# Patient Record
Sex: Female | Born: 1955 | Race: White | Hispanic: No | Smoking: Never smoker
Health system: Southern US, Community
[De-identification: ages and names within clinical notes are randomized; demographics above are authoritative.]

## PROBLEM LIST (undated history)

## (undated) DIAGNOSIS — I509 Heart failure, unspecified: Secondary | ICD-10-CM

## (undated) DIAGNOSIS — I10 Essential (primary) hypertension: Secondary | ICD-10-CM

## (undated) DIAGNOSIS — I639 Cerebral infarction, unspecified: Secondary | ICD-10-CM

## (undated) DIAGNOSIS — E119 Type 2 diabetes mellitus without complications: Secondary | ICD-10-CM

## (undated) DIAGNOSIS — R161 Splenomegaly, not elsewhere classified: Secondary | ICD-10-CM

## (undated) HISTORY — PX: APPENDECTOMY: SHX54

## (undated) HISTORY — PX: CHOLECYSTECTOMY: SHX55

## (undated) HISTORY — DX: Splenomegaly, not elsewhere classified: R16.1

## (undated) HISTORY — PX: OTHER SURGICAL HISTORY: SHX169

---

## 1998-03-20 ENCOUNTER — Other Ambulatory Visit: Admission: RE | Admit: 1998-03-20 | Discharge: 1998-03-20 | Payer: Self-pay | Admitting: *Deleted

## 1998-11-15 ENCOUNTER — Ambulatory Visit (HOSPITAL_BASED_OUTPATIENT_CLINIC_OR_DEPARTMENT_OTHER): Admission: RE | Admit: 1998-11-15 | Discharge: 1998-11-15 | Payer: Self-pay | Admitting: Plastic Surgery

## 1998-12-28 ENCOUNTER — Encounter: Payer: Self-pay | Admitting: Family Medicine

## 1998-12-28 ENCOUNTER — Ambulatory Visit (HOSPITAL_COMMUNITY): Admission: RE | Admit: 1998-12-28 | Discharge: 1998-12-28 | Payer: Self-pay | Admitting: Family Medicine

## 1999-04-18 ENCOUNTER — Other Ambulatory Visit: Admission: RE | Admit: 1999-04-18 | Discharge: 1999-04-18 | Payer: Self-pay | Admitting: *Deleted

## 2000-06-19 ENCOUNTER — Other Ambulatory Visit: Admission: RE | Admit: 2000-06-19 | Discharge: 2000-06-19 | Payer: Self-pay | Admitting: *Deleted

## 2001-02-02 ENCOUNTER — Encounter: Admission: RE | Admit: 2001-02-02 | Discharge: 2001-02-02 | Payer: Self-pay | Admitting: Orthopedic Surgery

## 2001-02-02 ENCOUNTER — Encounter: Payer: Self-pay | Admitting: Orthopedic Surgery

## 2002-04-12 ENCOUNTER — Encounter: Payer: Self-pay | Admitting: *Deleted

## 2002-04-12 ENCOUNTER — Encounter: Admission: RE | Admit: 2002-04-12 | Discharge: 2002-04-12 | Payer: Self-pay | Admitting: *Deleted

## 2004-12-06 ENCOUNTER — Other Ambulatory Visit: Admission: RE | Admit: 2004-12-06 | Discharge: 2004-12-06 | Payer: Self-pay | Admitting: Obstetrics and Gynecology

## 2005-02-14 ENCOUNTER — Encounter: Admission: RE | Admit: 2005-02-14 | Discharge: 2005-02-14 | Payer: Self-pay | Admitting: Family Medicine

## 2005-05-01 ENCOUNTER — Encounter: Admission: RE | Admit: 2005-05-01 | Discharge: 2005-05-01 | Payer: Self-pay | Admitting: Neurosurgery

## 2011-01-22 ENCOUNTER — Inpatient Hospital Stay: Payer: Self-pay | Admitting: Internal Medicine

## 2011-01-25 ENCOUNTER — Emergency Department: Payer: Self-pay | Admitting: Emergency Medicine

## 2011-10-16 ENCOUNTER — Inpatient Hospital Stay: Payer: Self-pay | Admitting: *Deleted

## 2012-04-28 ENCOUNTER — Emergency Department: Payer: Self-pay | Admitting: Emergency Medicine

## 2012-04-28 LAB — CBC
HCT: 34 % — ABNORMAL LOW (ref 35.0–47.0)
MCHC: 34.3 g/dL (ref 32.0–36.0)
MCV: 92 fL (ref 80–100)
Platelet: 158 10*3/uL (ref 150–440)
RDW: 13.6 % (ref 11.5–14.5)
WBC: 7.2 10*3/uL (ref 3.6–11.0)

## 2012-04-28 LAB — BASIC METABOLIC PANEL
Chloride: 104 mmol/L (ref 98–107)
Co2: 32 mmol/L (ref 21–32)
Creatinine: 1.1 mg/dL (ref 0.60–1.30)
EGFR (Non-African Amer.): 56 — ABNORMAL LOW
Potassium: 3.4 mmol/L — ABNORMAL LOW (ref 3.5–5.1)
Sodium: 141 mmol/L (ref 136–145)

## 2012-08-11 LAB — URINALYSIS, COMPLETE
Ketone: NEGATIVE
Ph: 6 (ref 4.5–8.0)
Protein: 100
RBC,UR: 6 /HPF (ref 0–5)
Squamous Epithelial: 17

## 2012-08-11 LAB — COMPREHENSIVE METABOLIC PANEL
Anion Gap: 8 (ref 7–16)
BUN: 17 mg/dL (ref 7–18)
Calcium, Total: 9 mg/dL (ref 8.5–10.1)
Chloride: 103 mmol/L (ref 98–107)
EGFR (African American): >60
EGFR (Non-African Amer.): >60
Osmolality: 285 (ref 275–301)
Potassium: 3.3 mmol/L — ABNORMAL LOW (ref 3.5–5.1)
SGOT(AST): 28 U/L (ref 15–37)
SGPT (ALT): 27 U/L (ref 12–78)
Total Protein: 7 g/dL (ref 6.4–8.2)

## 2012-08-11 LAB — TROPONIN I: Troponin-I: <0.02 ng/mL

## 2012-08-11 LAB — CBC
HCT: 35.2 % (ref 35.0–47.0)
HGB: 12.5 g/dL (ref 12.0–16.0)
MCH: 31.3 pg (ref 26.0–34.0)
MCHC: 35.5 g/dL (ref 32.0–36.0)
MCV: 88 fL (ref 80–100)
RBC: 3.99 10*6/uL (ref 3.80–5.20)

## 2012-08-11 LAB — DRUG SCREEN, URINE
Amphetamines, Ur Screen: NEGATIVE (ref ?–1000)
Barbiturates, Ur Screen: NEGATIVE (ref ?–200)
Benzodiazepine, Ur Scrn: NEGATIVE (ref ?–200)
Methadone, Ur Screen: NEGATIVE (ref ?–300)
Opiate, Ur Screen: NEGATIVE (ref ?–300)
Phencyclidine (PCP) Ur S: NEGATIVE (ref ?–25)

## 2012-08-12 ENCOUNTER — Inpatient Hospital Stay: Payer: Self-pay | Admitting: Internal Medicine

## 2012-08-12 LAB — TROPONIN I: Troponin-I: <0.02 ng/mL

## 2012-08-12 LAB — CK TOTAL AND CKMB (NOT AT ARMC)
CK, Total: 44 U/L (ref 21–215)
CK, Total: 39 U/L (ref 21–215)
CK-MB: 1.5 ng/mL (ref 0.5–3.6)
CK-MB: 1.1 ng/mL (ref 0.5–3.6)

## 2012-08-12 LAB — PRO B NATRIURETIC PEPTIDE: B-Type Natriuretic Peptide: 317 pg/mL — ABNORMAL HIGH (ref 0–125)

## 2012-08-12 LAB — SEDIMENTATION RATE: Erythrocyte Sed Rate: 29 mm/hr (ref 0–30)

## 2012-08-13 LAB — CBC WITH DIFFERENTIAL/PLATELET
Basophil #: 0 10*3/uL (ref 0.0–0.1)
Eosinophil %: 1.4 %
HGB: 11.3 g/dL — ABNORMAL LOW (ref 12.0–16.0)
Lymphocyte %: 24.3 %
MCH: 30.9 pg (ref 26.0–34.0)
Monocyte #: 0.6 x10 3/mm (ref 0.2–0.9)
Neutrophil %: 65.2 %
Platelet: 206 10*3/uL (ref 150–440)
RBC: 3.65 10*6/uL — ABNORMAL LOW (ref 3.80–5.20)

## 2012-08-13 LAB — BASIC METABOLIC PANEL
BUN: 22 mg/dL — ABNORMAL HIGH (ref 7–18)
Creatinine: 1.25 mg/dL (ref 0.60–1.30)
EGFR (Non-African Amer.): 48 — ABNORMAL LOW
Glucose: 336 mg/dL — ABNORMAL HIGH (ref 65–99)
Potassium: 3.4 mmol/L — ABNORMAL LOW (ref 3.5–5.1)
Sodium: 138 mmol/L (ref 136–145)

## 2012-08-13 LAB — PROTIME-INR: INR: 1

## 2012-08-13 LAB — LIPID PANEL
Ldl Cholesterol, Calc: 28 mg/dL (ref 0–100)
Triglycerides: 268 mg/dL — ABNORMAL HIGH (ref 0–200)
VLDL Cholesterol, Calc: 54 mg/dL — ABNORMAL HIGH (ref 5–40)

## 2012-08-13 LAB — HEMOGLOBIN A1C: Hemoglobin A1C: 9.4 % — ABNORMAL HIGH (ref 4.2–6.3)

## 2012-08-14 LAB — BASIC METABOLIC PANEL
Calcium, Total: 8.5 mg/dL (ref 8.5–10.1)
Co2: 26 mmol/L (ref 21–32)
Potassium: 3.9 mmol/L (ref 3.5–5.1)
Sodium: 141 mmol/L (ref 136–145)

## 2012-08-16 ENCOUNTER — Emergency Department: Payer: Self-pay | Admitting: Emergency Medicine

## 2012-08-16 LAB — COMPREHENSIVE METABOLIC PANEL
Albumin: 3.2 g/dL — ABNORMAL LOW (ref 3.4–5.0)
Anion Gap: 7 (ref 7–16)
Bilirubin,Total: 0.5 mg/dL (ref 0.2–1.0)
Chloride: 106 mmol/L (ref 98–107)
Glucose: 247 mg/dL — ABNORMAL HIGH (ref 65–99)
Potassium: 3.7 mmol/L (ref 3.5–5.1)
SGOT(AST): 25 U/L (ref 15–37)
SGPT (ALT): 31 U/L (ref 12–78)
Sodium: 139 mmol/L (ref 136–145)
Total Protein: 7.1 g/dL (ref 6.4–8.2)

## 2012-08-16 LAB — CBC
MCH: 32 pg (ref 26.0–34.0)
MCHC: 35.4 g/dL (ref 32.0–36.0)
MCV: 91 fL (ref 80–100)
Platelet: 187 10*3/uL (ref 150–440)
RBC: 3.73 10*6/uL — ABNORMAL LOW (ref 3.80–5.20)

## 2012-08-27 ENCOUNTER — Observation Stay: Payer: Self-pay | Admitting: Internal Medicine

## 2012-08-27 LAB — BASIC METABOLIC PANEL
Calcium, Total: 9.3 mg/dL (ref 8.5–10.1)
Chloride: 106 mmol/L (ref 98–107)
EGFR (African American): >60
EGFR (Non-African Amer.): >60
Glucose: 282 mg/dL — ABNORMAL HIGH (ref 65–99)
Potassium: 4.1 mmol/L (ref 3.5–5.1)
Sodium: 140 mmol/L (ref 136–145)

## 2012-08-27 LAB — CBC
MCH: 30.4 pg (ref 26.0–34.0)
MCV: 91 fL (ref 80–100)
Platelet: 207 10*3/uL (ref 150–440)
RDW: 13.5 % (ref 11.5–14.5)

## 2012-08-28 LAB — BASIC METABOLIC PANEL
Co2: 26 mmol/L (ref 21–32)
Glucose: 241 mg/dL — ABNORMAL HIGH (ref 65–99)
Osmolality: 294 (ref 275–301)

## 2012-08-29 ENCOUNTER — Ambulatory Visit: Payer: Self-pay | Admitting: Neurology

## 2013-04-29 LAB — CBC
HCT: 38.8 % (ref 35.0–47.0)
MCH: 29.3 pg (ref 26.0–34.0)
MCV: 87 fL (ref 80–100)
RDW: 13.1 % (ref 11.5–14.5)
WBC: 7.9 10*3/uL (ref 3.6–11.0)

## 2013-04-29 LAB — TROPONIN I: Troponin-I: 0.02 ng/mL

## 2013-04-29 LAB — BASIC METABOLIC PANEL
Chloride: 89 mmol/L — ABNORMAL LOW (ref 98–107)
Co2: 32 mmol/L (ref 21–32)
Creatinine: 1.26 mg/dL (ref 0.60–1.30)
Osmolality: 291 (ref 275–301)
Potassium: 2.8 mmol/L — ABNORMAL LOW (ref 3.5–5.1)
Sodium: 131 mmol/L — ABNORMAL LOW (ref 136–145)

## 2013-04-29 LAB — PRO B NATRIURETIC PEPTIDE: B-Type Natriuretic Peptide: 1796 pg/mL — ABNORMAL HIGH (ref 0–125)

## 2013-04-30 ENCOUNTER — Inpatient Hospital Stay: Payer: Self-pay | Admitting: Internal Medicine

## 2013-04-30 LAB — PROTIME-INR: INR: 1

## 2013-04-30 LAB — APTT: Activated PTT: 30.9 secs (ref 23.6–35.9)

## 2013-04-30 LAB — BASIC METABOLIC PANEL
BUN: 11 mg/dL (ref 7–18)
Chloride: 94 mmol/L — ABNORMAL LOW (ref 98–107)
Glucose: 275 mg/dL — ABNORMAL HIGH (ref 65–99)
Sodium: 136 mmol/L (ref 136–145)

## 2013-04-30 LAB — TROPONIN I: Troponin-I: <0.02 ng/mL

## 2013-05-01 ENCOUNTER — Ambulatory Visit: Payer: Self-pay | Admitting: Neurology

## 2013-05-01 DIAGNOSIS — I6789 Other cerebrovascular disease: Secondary | ICD-10-CM

## 2013-05-01 LAB — CBC WITH DIFFERENTIAL/PLATELET
Basophil #: 0.1 10*3/uL (ref 0.0–0.1)
Eosinophil #: 0 10*3/uL (ref 0.0–0.7)
Eosinophil %: 0 %
HCT: 40.3 % (ref 35.0–47.0)
HGB: 13.9 g/dL (ref 12.0–16.0)
Lymphocyte #: 0.7 10*3/uL — ABNORMAL LOW (ref 1.0–3.6)
Lymphocyte %: 3 %
MCH: 30 pg (ref 26.0–34.0)
MCHC: 34.6 g/dL (ref 32.0–36.0)
Monocyte #: 0.8 x10 3/mm (ref 0.2–0.9)
Monocyte %: 3.4 %
Neutrophil #: 20.9 10*3/uL — ABNORMAL HIGH (ref 1.4–6.5)
Platelet: 268 10*3/uL (ref 150–440)
RBC: 4.64 10*6/uL (ref 3.80–5.20)
RDW: 13.1 % (ref 11.5–14.5)

## 2013-05-01 LAB — BASIC METABOLIC PANEL
Anion Gap: 11 (ref 7–16)
BUN: 20 mg/dL — ABNORMAL HIGH (ref 7–18)
Calcium, Total: 9.7 mg/dL (ref 8.5–10.1)
Co2: 28 mmol/L (ref 21–32)
Creatinine: 1.66 mg/dL — ABNORMAL HIGH (ref 0.60–1.30)
EGFR (African American): 39 — ABNORMAL LOW
Potassium: 3.3 mmol/L — ABNORMAL LOW (ref 3.5–5.1)

## 2013-05-01 LAB — URINALYSIS, COMPLETE
Bilirubin,UR: NEGATIVE
Glucose,UR: >=500 mg/dL (ref 0–75)
Hyaline Cast: 3
Nitrite: NEGATIVE
Ph: 5 (ref 4.5–8.0)
Specific Gravity: 1.025 (ref 1.003–1.030)
Squamous Epithelial: 1

## 2013-05-01 LAB — MAGNESIUM
Magnesium: 1.3 mg/dL — ABNORMAL LOW
Magnesium: 1.9 mg/dL

## 2013-05-02 ENCOUNTER — Inpatient Hospital Stay (HOSPITAL_COMMUNITY): Payer: Medicaid Other

## 2013-05-02 ENCOUNTER — Inpatient Hospital Stay (HOSPITAL_COMMUNITY)
Admission: EM | Admit: 2013-05-02 | Discharge: 2013-06-17 | DRG: 004 | Disposition: A | Discharge status: Home / Self Care | Payer: Medicaid Other | Source: Other Acute Inpatient Hospital | Attending: Internal Medicine | Admitting: Internal Medicine

## 2013-05-02 DIAGNOSIS — E870 Hyperosmolality and hypernatremia: Secondary | ICD-10-CM | POA: Diagnosis not present

## 2013-05-02 DIAGNOSIS — E876 Hypokalemia: Secondary | ICD-10-CM | POA: Diagnosis not present

## 2013-05-02 DIAGNOSIS — E119 Type 2 diabetes mellitus without complications: Secondary | ICD-10-CM | POA: Diagnosis present

## 2013-05-02 DIAGNOSIS — Z7902 Long term (current) use of antithrombotics/antiplatelets: Secondary | ICD-10-CM

## 2013-05-02 DIAGNOSIS — R4020 Unspecified coma: Secondary | ICD-10-CM | POA: Diagnosis present

## 2013-05-02 DIAGNOSIS — R031 Nonspecific low blood-pressure reading: Secondary | ICD-10-CM | POA: Diagnosis not present

## 2013-05-02 DIAGNOSIS — J209 Acute bronchitis, unspecified: Secondary | ICD-10-CM | POA: Diagnosis not present

## 2013-05-02 DIAGNOSIS — R569 Unspecified convulsions: Secondary | ICD-10-CM | POA: Diagnosis present

## 2013-05-02 DIAGNOSIS — N189 Chronic kidney disease, unspecified: Secondary | ICD-10-CM | POA: Diagnosis present

## 2013-05-02 DIAGNOSIS — G8190 Hemiplegia, unspecified affecting unspecified side: Secondary | ICD-10-CM | POA: Diagnosis present

## 2013-05-02 DIAGNOSIS — Z8673 Personal history of transient ischemic attack (TIA), and cerebral infarction without residual deficits: Secondary | ICD-10-CM

## 2013-05-02 DIAGNOSIS — Z6832 Body mass index (BMI) 32.0-32.9, adult: Secondary | ICD-10-CM

## 2013-05-02 DIAGNOSIS — H04129 Dry eye syndrome of unspecified lacrimal gland: Secondary | ICD-10-CM | POA: Diagnosis not present

## 2013-05-02 DIAGNOSIS — E46 Unspecified protein-calorie malnutrition: Secondary | ICD-10-CM | POA: Diagnosis present

## 2013-05-02 DIAGNOSIS — R1310 Dysphagia, unspecified: Secondary | ICD-10-CM | POA: Diagnosis not present

## 2013-05-02 DIAGNOSIS — R4789 Other speech disturbances: Secondary | ICD-10-CM | POA: Diagnosis not present

## 2013-05-02 DIAGNOSIS — E785 Hyperlipidemia, unspecified: Secondary | ICD-10-CM | POA: Diagnosis present

## 2013-05-02 DIAGNOSIS — G4733 Obstructive sleep apnea (adult) (pediatric): Secondary | ICD-10-CM | POA: Diagnosis present

## 2013-05-02 DIAGNOSIS — R29898 Other symptoms and signs involving the musculoskeletal system: Secondary | ICD-10-CM | POA: Diagnosis present

## 2013-05-02 DIAGNOSIS — E1142 Type 2 diabetes mellitus with diabetic polyneuropathy: Secondary | ICD-10-CM | POA: Diagnosis present

## 2013-05-02 DIAGNOSIS — Z9119 Patient's noncompliance with other medical treatment and regimen: Secondary | ICD-10-CM

## 2013-05-02 DIAGNOSIS — G40901 Epilepsy, unspecified, not intractable, with status epilepticus: Secondary | ICD-10-CM

## 2013-05-02 DIAGNOSIS — I509 Heart failure, unspecified: Secondary | ICD-10-CM | POA: Diagnosis present

## 2013-05-02 DIAGNOSIS — I129 Hypertensive chronic kidney disease with stage 1 through stage 4 chronic kidney disease, or unspecified chronic kidney disease: Secondary | ICD-10-CM | POA: Diagnosis present

## 2013-05-02 DIAGNOSIS — D649 Anemia, unspecified: Secondary | ICD-10-CM | POA: Diagnosis present

## 2013-05-02 DIAGNOSIS — R29810 Facial weakness: Secondary | ICD-10-CM | POA: Diagnosis present

## 2013-05-02 DIAGNOSIS — I6350 Cerebral infarction due to unspecified occlusion or stenosis of unspecified cerebral artery: Principal | ICD-10-CM | POA: Diagnosis present

## 2013-05-02 DIAGNOSIS — E669 Obesity, unspecified: Secondary | ICD-10-CM | POA: Diagnosis not present

## 2013-05-02 DIAGNOSIS — E1149 Type 2 diabetes mellitus with other diabetic neurological complication: Secondary | ICD-10-CM | POA: Diagnosis present

## 2013-05-02 DIAGNOSIS — I639 Cerebral infarction, unspecified: Secondary | ICD-10-CM | POA: Diagnosis present

## 2013-05-02 DIAGNOSIS — G40109 Localization-related (focal) (partial) symptomatic epilepsy and epileptic syndromes with simple partial seizures, not intractable, without status epilepticus: Secondary | ICD-10-CM | POA: Diagnosis not present

## 2013-05-02 DIAGNOSIS — B370 Candidal stomatitis: Secondary | ICD-10-CM | POA: Diagnosis not present

## 2013-05-02 DIAGNOSIS — Z91199 Patient's noncompliance with other medical treatment and regimen due to unspecified reason: Secondary | ICD-10-CM

## 2013-05-02 DIAGNOSIS — N179 Acute kidney failure, unspecified: Secondary | ICD-10-CM | POA: Diagnosis not present

## 2013-05-02 DIAGNOSIS — J9600 Acute respiratory failure, unspecified whether with hypoxia or hypercapnia: Secondary | ICD-10-CM | POA: Diagnosis present

## 2013-05-02 DIAGNOSIS — I5032 Chronic diastolic (congestive) heart failure: Secondary | ICD-10-CM | POA: Diagnosis present

## 2013-05-02 DIAGNOSIS — R4182 Altered mental status, unspecified: Secondary | ICD-10-CM

## 2013-05-02 DIAGNOSIS — Z930 Tracheostomy status: Secondary | ICD-10-CM

## 2013-05-02 DIAGNOSIS — Z66 Do not resuscitate: Secondary | ICD-10-CM | POA: Diagnosis not present

## 2013-05-02 DIAGNOSIS — Z79899 Other long term (current) drug therapy: Secondary | ICD-10-CM

## 2013-05-02 DIAGNOSIS — R739 Hyperglycemia, unspecified: Secondary | ICD-10-CM | POA: Diagnosis present

## 2013-05-02 DIAGNOSIS — G9340 Encephalopathy, unspecified: Secondary | ICD-10-CM | POA: Diagnosis not present

## 2013-05-02 DIAGNOSIS — I10 Essential (primary) hypertension: Secondary | ICD-10-CM | POA: Diagnosis present

## 2013-05-02 HISTORY — DX: Heart failure, unspecified: I50.9

## 2013-05-02 HISTORY — DX: Essential (primary) hypertension: I10

## 2013-05-02 HISTORY — DX: Cerebral infarction, unspecified: I63.9

## 2013-05-02 HISTORY — DX: Type 2 diabetes mellitus without complications: E11.9

## 2013-05-02 LAB — BASIC METABOLIC PANEL
BUN: 35 mg/dL — ABNORMAL HIGH (ref 7–18)
Calcium, Total: 8.5 mg/dL (ref 8.5–10.1)
Co2: 31 mmol/L (ref 21–32)
Creatinine: 3.12 mg/dL — ABNORMAL HIGH (ref 0.60–1.30)
EGFR (African American): 18 — ABNORMAL LOW
EGFR (Non-African Amer.): 16 — ABNORMAL LOW
Potassium: 3.6 mmol/L (ref 3.5–5.1)
Sodium: 144 mmol/L (ref 136–145)

## 2013-05-02 LAB — COMPREHENSIVE METABOLIC PANEL
ALT: 10 U/L (ref 0–35)
Alkaline Phosphatase: 74 U/L (ref 39–117)
CO2: 25 mEq/L (ref 19–32)
Calcium: 8.1 mg/dL — ABNORMAL LOW (ref 8.4–10.5)
GFR calc Af Amer: 20 mL/min — ABNORMAL LOW (ref >90)
GFR calc non Af Amer: 18 mL/min — ABNORMAL LOW (ref >90)
Glucose, Bld: 194 mg/dL — ABNORMAL HIGH (ref 70–99)
Sodium: 139 mEq/L (ref 135–145)

## 2013-05-02 LAB — CBC WITH DIFFERENTIAL/PLATELET
Eosinophil #: 0 10*3/uL (ref 0.0–0.7)
Eosinophils Relative: 1 % (ref 0–5)
HCT: 33 % — ABNORMAL LOW (ref 36.0–46.0)
Hemoglobin: 11 g/dL — ABNORMAL LOW (ref 12.0–15.0)
Lymphocyte %: 7 %
Lymphocytes Relative: 8 % — ABNORMAL LOW (ref 12–46)
Lymphs Abs: 1.2 10*3/uL (ref 0.7–4.0)
MCH: 30.3 pg (ref 26.0–34.0)
MCV: 89 fL (ref 80–100)
MCV: 90.4 fL (ref 78.0–100.0)
Monocyte #: 1 x10 3/mm — ABNORMAL HIGH (ref 0.2–0.9)
Monocyte %: 6.3 %
Monocytes Relative: 8 % (ref 3–12)
Neutrophil #: 14.3 10*3/uL — ABNORMAL HIGH (ref 1.4–6.5)
Platelet: 228 10*3/uL (ref 150–440)
Platelets: 187 10*3/uL (ref 150–400)
RBC: 3.65 MIL/uL — ABNORMAL LOW (ref 3.87–5.11)
RDW: 13.2 % (ref 11.5–14.5)
WBC: 15.1 10*3/uL — ABNORMAL HIGH (ref 4.0–10.5)

## 2013-05-02 LAB — BLOOD GAS, ARTERIAL
Bicarbonate: 23.1 mEq/L (ref 20.0–24.0)
PEEP: 5 cmH2O
pCO2 arterial: 35.1 mmHg (ref 35.0–45.0)
pH, Arterial: 7.434 (ref 7.350–7.450)
pO2, Arterial: 168 mmHg — ABNORMAL HIGH (ref 80.0–100.0)

## 2013-05-02 LAB — PROTEIN / CREATININE RATIO, URINE: Protein/Creat. Ratio: 506 mg/gCREAT — ABNORMAL HIGH (ref 0–200)

## 2013-05-02 LAB — PHOSPHORUS: Phosphorus: 3.6 mg/dL (ref 2.3–4.6)

## 2013-05-02 LAB — PROTIME-INR: INR: 1.19 (ref 0.00–1.49)

## 2013-05-02 LAB — GLUCOSE, CAPILLARY: Glucose-Capillary: 132 mg/dL — ABNORMAL HIGH (ref 70–99)

## 2013-05-02 MED ORDER — SODIUM CHLORIDE 0.9 % IV SOLN
100.0000 mg | Freq: Two times a day (BID) | INTRAVENOUS | Status: DC
  Administered 2013-05-03 (×2): 100 mg via INTRAVENOUS
  Filled 2013-05-02 (×6): qty 10

## 2013-05-02 MED ORDER — SODIUM CHLORIDE 0.9 % IV SOLN
1000.0000 mg | Freq: Two times a day (BID) | INTRAVENOUS | Status: DC
  Administered 2013-05-02 – 2013-05-11 (×18): 1000 mg via INTRAVENOUS
  Filled 2013-05-02 (×19): qty 10

## 2013-05-02 MED ORDER — PROPOFOL 10 MG/ML IV EMUL: 5.0000 ug/kg/min | INTRAVENOUS | Status: DC

## 2013-05-02 MED ORDER — SODIUM CHLORIDE 0.9 % IV SOLN
INTRAVENOUS | Status: DC
  Administered 2013-05-02: 22:00:00 via INTRAVENOUS
  Administered 2013-05-06: 15 mL/h via INTRAVENOUS
  Administered 2013-05-08 – 2013-05-11 (×3): via INTRAVENOUS

## 2013-05-02 MED ORDER — STROKE: EARLY STAGES OF RECOVERY BOOK
Freq: Once | Status: AC
  Administered 2013-05-03: 04:00:00
  Filled 2013-05-02: qty 1

## 2013-05-02 MED ORDER — BIOTENE DRY MOUTH MT LIQD
1.0000 "application " | Freq: Four times a day (QID) | OROMUCOSAL | Status: DC
  Administered 2013-05-02 – 2013-06-13 (×166): 15 mL via OROMUCOSAL

## 2013-05-02 MED ORDER — INSULIN ASPART 100 UNIT/ML ~~LOC~~ SOLN
0.0000 [IU] | SUBCUTANEOUS | Status: DC
  Administered 2013-05-03 (×2): 5 [IU] via SUBCUTANEOUS
  Administered 2013-05-03: 3 [IU] via SUBCUTANEOUS
  Administered 2013-05-03 (×3): 5 [IU] via SUBCUTANEOUS
  Administered 2013-05-04: 3 [IU] via SUBCUTANEOUS
  Administered 2013-05-04 – 2013-05-05 (×6): 5 [IU] via SUBCUTANEOUS
  Administered 2013-05-05 (×3): 8 [IU] via SUBCUTANEOUS

## 2013-05-02 MED ORDER — PANTOPRAZOLE SODIUM 40 MG IV SOLR
40.0000 mg | INTRAVENOUS | Status: DC
  Administered 2013-05-02 – 2013-05-03 (×2): 40 mg via INTRAVENOUS
  Filled 2013-05-02 (×3): qty 40

## 2013-05-02 MED ORDER — CHLORHEXIDINE GLUCONATE 0.12 % MT SOLN
15.0000 mL | Freq: Two times a day (BID) | OROMUCOSAL | Status: DC
  Administered 2013-05-02: 15 mL via OROMUCOSAL
  Filled 2013-05-02: qty 15

## 2013-05-02 MED ORDER — SODIUM CHLORIDE 0.9 % IV SOLN
200.0000 mg | Freq: Once | INTRAVENOUS | Status: AC
  Administered 2013-05-02: 200 mg via INTRAVENOUS
  Filled 2013-05-02: qty 20

## 2013-05-02 MED ORDER — SODIUM CHLORIDE 0.9 % IV SOLN
INTRAVENOUS | Status: DC
  Administered 2013-05-03 – 2013-05-04 (×2): via INTRAVENOUS

## 2013-05-02 MED ORDER — ASPIRIN 300 MG RE SUPP
300.0000 mg | Freq: Every day | RECTAL | Status: DC
  Administered 2013-05-03: 300 mg via RECTAL
  Filled 2013-05-02 (×2): qty 1

## 2013-05-02 MED ORDER — FENTANYL CITRATE 0.05 MG/ML IJ SOLN
100.0000 ug | INTRAMUSCULAR | Status: DC | PRN
  Administered 2013-05-02: 100 ug via INTRAVENOUS
  Filled 2013-05-02: qty 2

## 2013-05-02 NOTE — Consult Note (Signed)
NEURO HOSPITALIST CONSULT NOTE    Reason for Consult: altered mental status, seizures.  HPI:                                                                                                                                          Brenda Pope is an 57 y.o. female with a past medical history significant for HTN, DM, hyperlipidemia, chronic diastolic heart failure, strokes x3 without residual deficits, transferred to [facility] for further management. I reviewed the available clinical records and they indicated that she was admitted to [facility] on  with a syndrome considered to be consistent with CHF exacerbation/pulmonary edema. Then, she was noted to be less responsive and developed left facial droop, left sided weakness, and slurred speech, and subsequently had to be intubated. In addition, she later on started having twitching movements of the left UE and nystagmus that were thought to be related to focal seizures and she was loaded with IV keppra. Work up at [employer] include MRI brain that disclosed several small acute infarcts in the right parietal area. Carotid doppler and TTE were reported as unremarkable. Brenda Pope is now off sedatives and remains unresponsive    No past medical history on file.  No past surgical history on file.  No family history on file.  Family History:   Social History:  has no tobacco, alcohol, and drug history on file.  Allergies not on file  MEDICATIONS:                                                                                                                     I have reviewed the patient's current medications.   ROS: unable to obtain from patient.  History: unobtainable from patient due to mental status    Physical exam: unresponsive, intubated. Blood pressure 126/61,  pulse 62, temperature 98.4 F (36.9 C), temperature source Oral, resp. rate 16, height 6' (1.829 m), weight 108 kg (238 lb 1.6 oz), SpO2 100.00%. Head: normocephalic. Neck: supple, no bruits, no JVD. Cardiac: no murmurs. Lungs: clear. Abdomen: soft, no tender, no mass. Extremities: no edema.  Neurologic Examination:                                                                                                      Patient off propofol and fentanyl: Mental status: unresponsive CN 2-12: right pupil 4 mm, left pupil 3 mm with sluggish reaction to light bilaterally. No gaze preference. EOM  presnt on Doll's maneuver. Corneal reflex present. No facial asymmetry. Tongue: intubated. Motor: twitching movements left UE and arms and legs are hyperextended in a decerebrate-like posturing. Sensory: no reaction to painful stimuli. DTR's: couldn't elicit. Plantars: mute Coordination and gait: no tested. No meningeal irritation signs.  No results found for this basename: cbc, bmp, coags, chol, tri, ldl, hga1c    Results for orders placed during the hospital encounter of 05/02/13 (from the past 48 hour(s))  GLUCOSE, CAPILLARY     Status: Abnormal   Collection Time    05/02/13  7:02 PM      Result Value Range   Glucose-Capillary 132 (*) 70 - 99 mg/dL  BLOOD GAS, ARTERIAL     Status: Abnormal   Collection Time    05/02/13  9:23 PM      Result Value Range   FIO2 0.40     Delivery systems VENTILATOR     Mode PRESSURE REGULATED VOLUME CONTROL     VT 500     Rate 14     Peep/cpap 5.0     pH, Arterial 7.434  7.350 - 7.450   pCO2 arterial 35.1  35.0 - 45.0 mmHg   pO2, Arterial 168.0 (*) 80.0 - 100.0 mmHg   Bicarbonate 23.1  20.0 - 24.0 mEq/L   TCO2 24.2  0 - 100 mmol/L   Acid-base deficit 0.6  0.0 - 2.0 mmol/L   O2 Saturation 99.8     Patient temperature 98.6     Collection site RIGHT RADIAL     Drawn by [phone]     Sample type ARTERIAL     Allens test (pass/fail) PASS  PASS    No results  found.   Assessment/Plan: 57 y/o with multiple medical problems, very recent right parietal infarcts and altered mental status. Patient exhibited jerking movements left UE with concomitant nystagmoid movements lasting for less than a minute. Concern patient is on SE in the context of recent cortical stroke. Recommend: 1) Continue keppra 1,000 mg BID. 2) Add vimpat 100 mg BID. 3) Continuous EEG monitoring. 4) Repeat MRI to evaluate possible stroke extension. 5) Will follow up.  Brenda Portela, MD [facility] [phone]  05/02/2013, 10:16 PM

## 2013-05-02 NOTE — H&P (Signed)
PULMONARY  / CRITICAL CARE MEDICINE  Name: Brenda Pope MRN: [id number] DOB: [birth year]    ADMISSION DATE:  05/02/2013 CONSULTATION DATE:  7/7/104  REFERRING MD :  Transfer from [facility] PRIMARY SERVICE:  PCCM  CHIEF COMPLAINT:  Status epilepticus  BRIEF PATIENT DESCRIPTION: 57 yo with past medical history of CVAs / TIAs and diastolic heart failure admitted to [facility] on 7/5 with acute dyspnea and suspected CHF exacerbation.  Later on the day of admission developed developed neurological findings consistent with acute CVA.  On 7/6 intubated for airway protection during suspected seizure activity.  Hypotensive post intubation requiring vasopressors. MRI confirmed small acute infarcts of the right parietal lobe. On 7/7 transferred to [facility] for further management.  SIGNIFICANT EVENTS / STUDIES:  7/5  Admitted to [facility] with CHF exacerbation; stroke symptoms 7/6  Suspected seizure activity, intubated for airway protection; hypotensive post intubation requiring vasopressors 7/6  Brain MRI >>> Multiple small acute infarcts R parietal lobe, possible small infarcts left basal ganglia and central pons 7/6  TTE >>> No source of CVA, EF 55-60% 7/6  Carotid Doppler >>> No evidence of hemodynamically significant stenosis 7/7  Transferred to [facility]  LINES / TUBES: OETT 7/6 >>> OGT 7/6 >>> Foley 7/6 >>> R IJ CVL 7/6 >>>  CULTURES:  ANTIBIOTICS:  The patient is encephalopathic and unable to provide history, which was obtained for available medical records.  HISTORY OF PRESENT ILLNESS: 57 yo with past medical history of diastolic heart failure admitted to [facility] on 7/5 with acute dyspnea and suspected CHF exacerbation.  Later on the day of admission developed developed neurological findings consistent with acute CVA.  On 7/6 intubated for airway protection during suspected seizure activity.  Hypotensive post intubation requiring vasopressors. MRI confirmed small acute  infarcts of the right parietal lobe. On 7/7 transferred to [facility] for further management.  PAST MEDICAL HISTORY :  No past medical history on file. No past surgical history on file. Prior to Admission medications   Not on File   Allergies not on file  FAMILY HISTORY:  No family history on file.  SOCIAL HISTORY:  has no tobacco, alcohol, and drug history on file.  REVIEW OF SYSTEMS:  Unable to provide.  INTERVAL HISTORY:  VITAL SIGNS: Temp:  [98.4 F (36.9 C)] 98.4 F (36.9 C) (07/07 2000) Pulse Rate:  [62] 62 (07/07 2030) Resp:  [16] 16 (07/07 2030) BP: (126)/(61) 126/61 mmHg (07/07 2030) SpO2:  [100 %] 100 % (07/07 2030) FiO2 (%):  [40 %] 40 % (07/07 2100) Weight:  [108 kg (238 lb 1.6 oz)] 108 kg (238 lb 1.6 oz) (07/07 2030)  HEMODYNAMICS:   VENTILATOR SETTINGS: Vent Mode:  [-] PRVC FiO2 (%):  [40 %] 40 % Set Rate:  [14 bmp-16 bmp] 14 bmp Vt Set:  [500 mL-580 mL] 500 mL PEEP:  [5 cmH20] 5 cmH20 Plateau Pressure:  [16 cmH20] 16 cmH20  INTAKE / OUTPUT: Intake/Output   None     PHYSICAL EXAMINATION: General:  Appears comfortable, mechanically ventilated, synchronous Neuro:  Well sedated, unable to participate in exam, cough / gag diminished, no overt seizure activity HEENT:  PERRL, OETT / OGT Cardiovascular:  RRR, no m/r/g Lungs:  Bilateral diminished air entry, no w/r/r Abdomen:  Soft, nontender, bowel sounds diminished Musculoskeletal:  Moves all extremities, no edema Skin:  Intact  LABS:  Recent Labs Lab 05/02/13 2123 05/02/13 2200  HGB  --  11.0*  WBC  --  15.1*  PLT  --  187  NA  --  139  K  --  3.9  CL  --  103  CO2  --  25  GLUCOSE  --  194*  BUN  --  38*  CREATININE  --  2.81*  CALCIUM  --  8.1*  MG  --  1.8  PHOS  --  3.6  AST  --  18  ALT  --  10  ALKPHOS  --  74  BILITOT  --  0.3  PROT  --  5.7*  ALBUMIN  --  2.2*  APTT  --  29  INR  --  1.19  PROCALCITON  --  0.17  PHART 7.434  --   PCO2ART 35.1  --   PO2ART 168.0*  --      Recent Labs Lab 05/02/13 1902  GLUCAP 132*   CXR:  7/7 >>>  ASSESSMENT / PLAN:  PULMONARY A:  Acute respiratory failure - intubated secondary to inability to protect airway.  Possible acute pulmonary edema secondary to CHF exacerbation on admission to [place], now appears to resolve. P:   Gaol SpO2>92, pH>7.30 Full mechanical support Daily SBT Trend ABG / CXR  CARDIOVASCULAR A: Transient hypotension after sedation / positive pressure ventilation, on minimal vasopressors.  Acute diastolic CHF exacerbation on admission to , now appears to resolve.  P:  Goal MAP>60 Titrate Levophed to off Adjust sedation as below Fluid management as below Hold preadmission Metoprolol  RENAL A:  Acute renal failure, secondary to hypotensive episode? P:   Trend CVP Trend BMP Check Mg, Phos NS@75  Hold preadmission Lasix / Aldactone  GASTROINTESTINAL A:  No active issues. P:   NPO as intubated TF if remains intubated > 24 hours Protonix for GI Px LFT Remove NG, place OG  HEMATOLOGIC A:  Mild anemia. P:  Trend CBC SCDs for DVT Px APTT / INR  INFECTIOUS A:  On Doxycycline preadmission.  On Levaquin at [facility]. Acute sinusitis mentioned in the transferred notes?  Suspected aspiration pneumonia? P:   Hold antibiotics PCT  ENDOCRINE  A:  DM2. Hyperglycemia. P:   SSI Hold preadmission Lantus  NEUROLOGIC A:  Acute CVA, likely watershed.  Acute encephalopathy. P:   Goal RASS 0 to -1 Hold preadmission BuSpar D/c Propofol as hypotensive Fentanyl PRN Neurology consulted: ASA, Plavix, Lipitor, anticoagulation, further workup per their discretion  TODAY'S SUMMARY: New R parietal infarct in setting of multiple previous CVAs, not clear if in setting of hypotension, carotids clear / no AF - repeat MRI, stroke prevention measures per Neurology.  Possible seizures in setting of acute stroke - on Keppra, Vimpat added.  VDRF, possible aspiration pneumonitis - full  mechanical support, mental status is limiting extubation, hold abx.  Hypotension in setting of positive pressure ventilation / Propofol - d/c Propofol / titrate Levophed to off.  I have personally obtained a history, examined the patient, evaluated laboratory and imaging results, formulated the assessment and plan and placed orders.  CRITICAL CARE:  The patient is critically ill with multiple organ systems failure and requires high complexity decision making for assessment and support, frequent evaluation and titration of therapies, application of advanced monitoring technologies and extensive interpretation of multiple databases. Critical Care Time devoted to patient care services described in this note is 45 minutes.   Lonia Farber, MD Pulmonary and Critical Care Medicine [facility] Pager: [phone]  05/02/2013, 9:11 PM

## 2013-05-03 ENCOUNTER — Encounter (HOSPITAL_COMMUNITY): Payer: Self-pay | Admitting: *Deleted

## 2013-05-03 ENCOUNTER — Inpatient Hospital Stay (HOSPITAL_COMMUNITY): Payer: Medicaid Other

## 2013-05-03 DIAGNOSIS — J9600 Acute respiratory failure, unspecified whether with hypoxia or hypercapnia: Secondary | ICD-10-CM

## 2013-05-03 LAB — GLUCOSE, CAPILLARY
Glucose-Capillary: 215 mg/dL — ABNORMAL HIGH (ref 70–99)
Glucose-Capillary: 199 mg/dL — ABNORMAL HIGH (ref 70–99)
Glucose-Capillary: 250 mg/dL — ABNORMAL HIGH (ref 70–99)

## 2013-05-03 LAB — HEMOGLOBIN A1C: Hgb A1c MFr Bld: 10.4 % — ABNORMAL HIGH (ref <5.7)

## 2013-05-03 LAB — BLOOD GAS, ARTERIAL
Bicarbonate: 24.7 mEq/L — ABNORMAL HIGH (ref 20.0–24.0)
O2 Saturation: 99.4 %
PEEP: 5 cmH2O
TCO2: 25.8 mmol/L (ref 0–100)
pH, Arterial: 7.427 (ref 7.350–7.450)
pO2, Arterial: 127 mmHg — ABNORMAL HIGH (ref 80.0–100.0)

## 2013-05-03 LAB — LIPID PANEL
HDL: 32 mg/dL — ABNORMAL LOW (ref >39)
Triglycerides: 153 mg/dL — ABNORMAL HIGH (ref <150)

## 2013-05-03 MED ORDER — FENTANYL CITRATE 0.05 MG/ML IJ SOLN: 50.0000 ug | INTRAMUSCULAR | Status: DC | PRN

## 2013-05-03 MED ORDER — CHLORHEXIDINE GLUCONATE 0.12 % MT SOLN
OROMUCOSAL | Status: AC
  Administered 2013-05-03: 15 mL via OROMUCOSAL
  Filled 2013-05-03: qty 15

## 2013-05-03 MED ORDER — VITAL AF 1.2 CAL PO LIQD
1000.0000 mL | ORAL | Status: DC
  Administered 2013-05-03: 1000 mL
  Filled 2013-05-03 (×3): qty 1000

## 2013-05-03 MED ORDER — MUPIROCIN 2 % EX OINT
1.0000 | TOPICAL_OINTMENT | Freq: Two times a day (BID) | CUTANEOUS | Status: AC
Start: 2013-05-03 — End: 2013-05-07
  Administered 2013-05-03 – 2013-05-07 (×10): 1 via NASAL
  Filled 2013-05-03: qty 22

## 2013-05-03 MED ORDER — CHLORHEXIDINE GLUCONATE CLOTH 2 % EX PADS
6.0000 | MEDICATED_PAD | Freq: Every day | CUTANEOUS | Status: AC
  Administered 2013-05-03 – 2013-05-07 (×5): 6 via TOPICAL

## 2013-05-03 MED ORDER — ASPIRIN 325 MG PO TABS
325.0000 mg | ORAL_TABLET | Freq: Every day | ORAL | Status: DC
  Administered 2013-05-04 – 2013-06-14 (×42): 325 mg
  Filled 2013-05-03 (×43): qty 1

## 2013-05-03 MED ORDER — MIDAZOLAM HCL 2 MG/2ML IJ SOLN: 2.0000 mg | INTRAMUSCULAR | Status: DC | PRN

## 2013-05-03 MED ORDER — CHLORHEXIDINE GLUCONATE 0.12 % MT SOLN
15.0000 mL | Freq: Two times a day (BID) | OROMUCOSAL | Status: DC
  Administered 2013-05-03 – 2013-06-14 (×83): 15 mL via OROMUCOSAL
  Filled 2013-05-03 (×85): qty 15

## 2013-05-03 NOTE — Procedures (Addendum)
ELECTROENCEPHALOGRAM REPORT   Patient: Brenda Pope       Room #: 3086 EEG No. ID: [id number] Age: 57 y.o.        Sex: female Referring Physician: Vassie Loll Report Date:  05/03/2013        Interpreting Physician: Aline Brochure  History: Karimah Winquist is an 57 y.o. female with respiratory failure and multiple acute cerebral infarctions involving right hemisphere primarily, as well as congestive heart failure and recurrent seizures.    Indications for study:  Assess severity of encephalopathy as well as rule out ongoing seizure activity.  Technique: This is an 18 channel routine scalp EEG performed at the bedside with bipolar and monopolar montages arranged in accordance to the international 10/20 system of electrode placement.   Description: Study was performed in the intensive care unit the patient's bedside. Patient was intubated and on mechanical ventilation. She's also on fentanyl for sedation. Background activity was asymmetric. Activity recorded from the left hemisphere consisted of mixed irregular delta and theta activity of moderate amplitude continuously. Activity recorded from the right hemisphere consisted of moderate to high amplitude periodic delta activity with mixed irregular data activity diffusely with an appearance consistent with periodic lateralized epileptiform discharges (PLEDs). Photic stimulation was not performed. No frank epileptiform discharges were recorded.  Interpretation: EEG is abnormal with findings consistent with severe diffuse encephalopathic process with asymmetrical involvement. Findings reported from the left hemisphere were nonspecific. Findings record from the right hemisphere were consistent with acute large area of infarction. No frank epileptiform activity was recorded. PLEDs are not in and of themselves considered epileptic, and are most often seen with acute large hemispheric strokes.   Venetia Maxon M.D. [facility] [phone]

## 2013-05-03 NOTE — Progress Notes (Signed)
Subjective: No recurrent seizure activity reported. Patient is currently on Vimpat and Keppra.  Objective: Current vital signs: BP 131/55  Pulse 64  Temp(Src) 98.9 F (37.2 C) (Oral)  Resp 14  Ht 6' (1.829 m)  Wt 108 kg (238 lb 1.6 oz)  BMI 32.28 kg/m2  SpO2 100%  Neurologic Exam: Intubated and on mechanical ventilation. Patient had minimal response to external noxious stimuli. Pupils were equal and reacted normally to light. Extraocular movements were intact to oculocephalic maneuvers. No facial weakness was noted. Patient had one episode of decerebrate posturing. No spontaneous movements of extremities was noted otherwise. She had no withdrawal movements to noxious stimuli. Muscle tone was flaccid throughout. Deep tendon reflexes were 2+ and symmetrical. Plantar responses were mute bilaterally.  Lab Results: Results for orders placed during the hospital encounter of 05/02/13 (from the past 48 hour(s))  GLUCOSE, CAPILLARY     Status: Abnormal   Collection Time    05/02/13  7:02 PM      Result Value Range   Glucose-Capillary 132 (*) 70 - 99 mg/dL  BLOOD GAS, ARTERIAL     Status: Abnormal   Collection Time    05/02/13  9:23 PM      Result Value Range   FIO2 0.40     Delivery systems VENTILATOR     Mode PRESSURE REGULATED VOLUME CONTROL     VT 500     Rate 14     Peep/cpap 5.0     pH, Arterial 7.434  7.350 - 7.450   pCO2 arterial 35.1  35.0 - 45.0 mmHg   pO2, Arterial 168.0 (*) 80.0 - 100.0 mmHg   Bicarbonate 23.1  20.0 - 24.0 mEq/L   TCO2 24.2  0 - 100 mmol/L   Acid-base deficit 0.6  0.0 - 2.0 mmol/L   O2 Saturation 99.8     Patient temperature 98.6     Collection site RIGHT RADIAL     Drawn by [phone]     Sample type ARTERIAL     Allens test (pass/fail) PASS  PASS  CBC WITH DIFFERENTIAL     Status: Abnormal   Collection Time    05/02/13 10:00 PM      Result Value Range   WBC 15.1 (*) 4.0 - 10.5 K/uL   RBC 3.65 (*) 3.87 - 5.11 MIL/uL   Hemoglobin 11.0 (*) 12.0  - 15.0 g/dL   HCT 52.8 (*) 41.3 - 24.4 %   MCV 90.4  78.0 - 100.0 fL   MCH 30.1  26.0 - 34.0 pg   MCHC 33.3  30.0 - 36.0 g/dL   RDW 01.0  27.2 - 53.6 %   Platelets 187  150 - 400 K/uL   Neutrophils Relative % 83 (*) 43 - 77 %   Neutro Abs 12.5 (*) 1.7 - 7.7 K/uL   Lymphocytes Relative 8 (*) 12 - 46 %   Lymphs Abs 1.2  0.7 - 4.0 K/uL   Monocytes Relative 8  3 - 12 %   Monocytes Absolute 1.2 (*) 0.1 - 1.0 K/uL   Eosinophils Relative 1  0 - 5 %   Eosinophils Absolute 0.1  0.0 - 0.7 K/uL   Basophils Relative 0  0 - 1 %   Basophils Absolute 0.0  0.0 - 0.1 K/uL  COMPREHENSIVE METABOLIC PANEL     Status: Abnormal   Collection Time    05/02/13 10:00 PM      Result Value Range   Sodium 139  135 -  145 mEq/L   Potassium 3.9  3.5 - 5.1 mEq/L   Chloride 103  96 - 112 mEq/L   CO2 25  19 - 32 mEq/L   Glucose, Bld 194 (*) 70 - 99 mg/dL   BUN 38 (*) 6 - 23 mg/dL   Creatinine, Ser 9.60 (*) 0.50 - 1.10 mg/dL   Calcium 8.1 (*) 8.4 - 10.5 mg/dL   Total Protein 5.7 (*) 6.0 - 8.3 g/dL   Albumin 2.2 (*) 3.5 - 5.2 g/dL   AST 18  0 - 37 U/L   ALT 10  0 - 35 U/L   Alkaline Phosphatase 74  39 - 117 U/L   Total Bilirubin 0.3  0.3 - 1.2 mg/dL   GFR calc non Af Amer 18 (*) >90 mL/min   GFR calc Af Amer 20 (*) >90 mL/min   Comment:            The eGFR has been calculated     using the CKD EPI equation.     This calculation has not been     validated in all clinical     situations.     eGFR's persistently     <90 mL/min signify     possible Chronic Kidney Disease.  APTT     Status: None   Collection Time    05/02/13 10:00 PM      Result Value Range   aPTT 29  24 - 37 seconds  PROTIME-INR     Status: None   Collection Time    05/02/13 10:00 PM      Result Value Range   Prothrombin Time 14.8  11.6 - 15.2 seconds   INR 1.19  0.00 - 1.49  MAGNESIUM     Status: None   Collection Time    05/02/13 10:00 PM      Result Value Range   Magnesium 1.8  1.5 - 2.5 mg/dL  PROCALCITONIN     Status: None    Collection Time    05/02/13 10:00 PM      Result Value Range   Procalcitonin 0.17     Comment:            Interpretation:     PCT (Procalcitonin) <= 0.5 ng/mL:     Systemic infection (sepsis) is not likely.     Local bacterial infection is possible.     (NOTE)             ICU PCT Algorithm               Non ICU PCT Algorithm        ----------------------------     ------------------------------             PCT < 0.25 ng/mL                 PCT < 0.1 ng/mL         Stopping of antibiotics            Stopping of antibiotics           strongly encouraged.               strongly encouraged.        ----------------------------     ------------------------------           PCT level decrease by               PCT < 0.25 ng/mL           >=  80% from peak PCT           OR PCT 0.25 - 0.5 ng/mL          Stopping of antibiotics                                                 encouraged.         Stopping of antibiotics               encouraged.        ----------------------------     ------------------------------           PCT level decrease by              PCT >= 0.25 ng/mL           < 80% from peak PCT            AND PCT >= 0.5 ng/mL            Continuing antibiotics                                                  encouraged.           Continuing antibiotics                encouraged.        ----------------------------     ------------------------------         PCT level increase compared          PCT > 0.5 ng/mL             with peak PCT AND              PCT >= 0.5 ng/mL             Escalation of antibiotics                                              strongly encouraged.          Escalation of antibiotics            strongly encouraged.  PHOSPHORUS     Status: None   Collection Time    05/02/13 10:00 PM      Result Value Range   Phosphorus 3.6  2.3 - 4.6 mg/dL  MRSA PCR SCREENING     Status: Abnormal   Collection Time    05/02/13 10:25 PM      Result Value Range   MRSA by PCR  POSITIVE (*) NEGATIVE   Comment:            The GeneXpert MRSA Assay (FDA     approved for NASAL specimens     only), is one component of a     comprehensive MRSA colonization     surveillance program. It is not     intended to diagnose MRSA     infection nor to guide or     monitor treatment for     MRSA infections.     RESULT CALLED TO, READ BACK BY AND VERIFIED WITH:  GRAHAM,K RN [id number] 05/03/13 MITCHELL,L  GLUCOSE, CAPILLARY     Status: Abnormal   Collection Time    05/03/13 12:40 AM      Result Value Range   Glucose-Capillary 204 (*) 70 - 99 mg/dL  GLUCOSE, CAPILLARY     Status: Abnormal   Collection Time    05/03/13  3:59 AM      Result Value Range   Glucose-Capillary 215 (*) 70 - 99 mg/dL  LIPID PANEL     Status: Abnormal   Collection Time    05/03/13  4:35 AM      Result Value Range   Cholesterol 126  0 - 200 mg/dL   Triglycerides 161 (*) <150 mg/dL   HDL 32 (*) >09 mg/dL   Total CHOL/HDL Ratio 3.9     VLDL 31  0 - 40 mg/dL   LDL Cholesterol 63  0 - 99 mg/dL   Comment:            Total Cholesterol/HDL:CHD Risk     Coronary Heart Disease Risk Table                         Men   Women      1/2 Average Risk   3.4   3.3      Average Risk       5.0   4.4      2 X Average Risk   9.6   7.1      3 X Average Risk  23.4   11.0                Use the calculated Patient Ratio     above and the CHD Risk Table     to determine the patient's CHD Risk.                ATP III CLASSIFICATION (LDL):      <100     mg/dL   Optimal      604-540  mg/dL   Near or Above                        Optimal      130-159  mg/dL   Borderline      981-191  mg/dL   High      >478     mg/dL   Very High  BLOOD GAS, ARTERIAL     Status: Abnormal   Collection Time    05/03/13  4:40 AM      Result Value Range   FIO2 0.40     Delivery systems VENTILATOR     Mode PRESSURE REGULATED VOLUME CONTROL     VT 500     Rate 14     Peep/cpap 5.0     pH, Arterial 7.427  7.350 - 7.450   pCO2  arterial 38.1  35.0 - 45.0 mmHg   pO2, Arterial 127.0 (*) 80.0 - 100.0 mmHg   Bicarbonate 24.7 (*) 20.0 - 24.0 mEq/L   TCO2 25.8  0 - 100 mmol/L   Acid-Base Excess 0.8  0.0 - 2.0 mmol/L   O2 Saturation 99.4     Patient temperature 98.6     Collection site RIGHT RADIAL     Drawn by [phone]     Sample type ARTERIAL     Allens test (pass/fail) PASS  PASS    Studies/Results: Dg Chest Port 1 View  05/02/2013   *  RADIOLOGY REPORT*  Clinical Data: G tube placement.  Line placement.  PORTABLE CHEST - 1 VIEW  Comparison: None.  Findings: Endotracheal tube is 2.5 cm above the carina.  Right central line tip is at the cavoatrial junction.  No pneumothorax. NG tube enters the stomach.  Bibasilar atelectasis.  No effusions.  Heart is upper limits normal in size.  No acute bony abnormality.  IMPRESSION: Support devices as above.  Bibasilar atelectasis.   Original Report Authenticated By: Charlett Nose, M.D.    Medications: I have reviewed the patient's current medications.  Assessment/Plan: Comatose state with encephalopathy, most likely multifactorial in etiology, including exacerbation of congestive heart failure, recurrent seizures with postictal state and acute cerebral infarctions. No seizure activity was noted during my evaluation.  No changes recommended in patient's current management. EEG to assess severity of encephalopathy as well as to rule out ongoing seizure activity, is pending. Recommend continuing Keppra and Vimpat for now. Renal function will be monitored and Keppra discontinued if deterioration is demonstrated.  We will continue to follow this patient closely with you.  C.R. Roseanne Reno, MD [facility] [phone]  05/03/2013  8:32 AM

## 2013-05-03 NOTE — Progress Notes (Signed)
PULMONARY  / CRITICAL CARE MEDICINE  Name: Brenda Pope MRN: [id number] DOB: [birth year]    ADMISSION DATE:  05/02/2013 CONSULTATION DATE:  7/7/104  REFERRING MD :  Transfer from [facility] PRIMARY SERVICE:  PCCM  CHIEF COMPLAINT:  Status epilepticus  BRIEF PATIENT DESCRIPTION: 57 yo with past medical history of CVAs / TIAs and diastolic heart failure admitted to [facility] on 7/5 with acute dyspnea and suspected CHF exacerbation.  Later on the day of admission developed developed neurological findings consistent with acute CVA.  On 7/6 intubated for airway protection during suspected seizure activity.  Hypotensive post intubation requiring vasopressors. MRI confirmed small acute infarcts of the right parietal lobe. On 7/7 transferred to [facility] for further management.  SIGNIFICANT EVENTS / STUDIES:  7/5  Admitted to [facility] with CHF exacerbation; stroke symptoms 7/6  Suspected seizure activity, intubated for airway protection; hypotensive post intubation requiring vasopressors 7/6  Brain MRI:  Multiple small acute infarcts R parietal lobe, possible small infarcts left basal ganglia and central pons 7/6  TTE:  No source of CVA, EF 55-60% 7/6  Carotid Doppler:  No evidence of hemodynamically significant stenosis 7/7  Transferred to [facility], PCCM serivce 7/7 Neuro consult: "Concern patient is on SE in the context of recent cortical stroke." 7/8 MRI brain: Confluent and scattered right MCA and right PCA acute infarcts, plus scattered small left hemisphere and occasional posterior fossa acute infarcts. The vast majority of these infarcts are newly seen since 05/01/2013.  This appearance suggests embolic phenomena which severely affected more proximal/medium-sized vessels of the right MCA and PCA territories. Cytotoxic edema without significant mass effect at this time. No associated hemorrhage 7/8 EEG: severe diffuse encephalopathic process with asymmetrical involvement. Findings  reported from the left hemisphere were nonspecific. Findings record from the right hemisphere were consistent with acute large area of infarction. No frank epileptiform activity was recorded. PLEDs I'm not in and of themselves considered epileptic, most often seen with acute large hemispheric strokes   LINES / TUBES: ETT 7/6 >>  R IJ CVL 7/6 >>   CULTURES:  ANTIBIOTICS:   SUBJ: Comatose.   VITAL SIGNS: Temp:  [97.9 F (36.6 C)-98.9 F (37.2 C)] 98.4 F (36.9 C) (07/08 1536) Pulse Rate:  [61-70] 66 (07/08 1500) Resp:  [14-20] 20 (07/08 1500) BP: (115-151)/(47-105) 143/67 mmHg (07/08 1500) SpO2:  [99 %-100 %] 100 % (07/08 1500) FiO2 (%):  [40 %] 40 % (07/08 0845) Weight:  [108 kg (238 lb 1.6 oz)] 108 kg (238 lb 1.6 oz) (07/07 2030)  HEMODYNAMICS: CVP:  [8 mmHg-11 mmHg] 11 mmHg VENTILATOR SETTINGS: Vent Mode:  [-] PRVC FiO2 (%):  [40 %] 40 % Set Rate:  [14 bmp-16 bmp] 14 bmp Vt Set:  [500 mL-580 mL] 500 mL PEEP:  [5 cmH20] 5 cmH20 Plateau Pressure:  [12 cmH20-16 cmH20] 15 cmH20  INTAKE / OUTPUT: Intake/Output     07/07 0701 - 07/08 0700 07/08 0701 - 07/09 0700   I.V. (mL/kg) 472.5 (4.4) 465.8 (4.3)   NG/GT  2.3   IV Piggyback 110 145   Total Intake(mL/kg) 582.5 (5.4) 613.2 (5.7)   Urine (mL/kg/hr) 365 240 (0.3)   Total Output 365 240   Net +217.5 +373.2          PHYSICAL EXAMINATION: General: comatose, NAD Neuro: no spont movement, no w/d from pain HEENT: PERRL, EOMI Cardiovascular:  RRR s M Lungs: clear Abdomen:  Soft, nontender, bowel sounds diminished Musculoskeletal: warm, no edema  LABS:  Recent  Labs Lab 05/02/13 2123 05/02/13 2200 05/03/13 0440  HGB  --  11.0*  --   WBC  --  15.1*  --   PLT  --  187  --   NA  --  139  --   K  --  3.9  --   CL  --  103  --   CO2  --  25  --   GLUCOSE  --  194*  --   BUN  --  38*  --   CREATININE  --  2.81*  --   CALCIUM  --  8.1*  --   MG  --  1.8  --   PHOS  --  3.6  --   AST  --  18  --   ALT  --  10   --   ALKPHOS  --  74  --   BILITOT  --  0.3  --   PROT  --  5.7*  --   ALBUMIN  --  2.2*  --   APTT  --  29  --   INR  --  1.19  --   PROCALCITON  --  0.17  --   PHART 7.434  --  7.427  PCO2ART 35.1  --  38.1  PO2ART 168.0*  --  127.0*    Recent Labs Lab 05/02/13 1902 05/03/13 0040 05/03/13 0359 05/03/13 0843 05/03/13 1255  GLUCAP 132* 204* 215* 207* 221*   CXR:  NACPD  ASSESSMENT / PLAN:  PULMONARY A:  Acute respiratory failure due to AMS P:   Cont full support Vent bundle Daily SBT as indicated  CARDIOVASCULAR A: Transient hypotension, resolved H/O CHF - compensated P:  Monitor  RENAL A:  Acute renal failure, unclear etiology, nonoliguric P:   COnt IVFs Monitor chemistries Correct electrolytes as indicated  GASTROINTESTINAL A:  No active issues. P:   Begin TFs  HEMATOLOGIC A:  Mild anemia. P:  Trend CBC SCDs for DVT Px  INFECTIOUS A:  No overt infections notes P:   Monitor off abx  ENDOCRINE  A:  DM2. Hyperglycemia. P:   Cont SSI   NEUROLOGIC A:  Acute CVA in multiple territories  - concern for embolic Acute encephalopathy Possible seizure 7/06 P:   TEE ordered 7/8 Neuro input appreciated Anti-convulsants per Neuro   I have personally obtained a history, examined the patient, evaluated laboratory and imaging results, formulated the assessment and plan and placed orders.  CRITICAL CARE:  The patient is critically ill with multiple organ systems failure and requires high complexity decision making for assessment and support, frequent evaluation and titration of therapies, application of advanced monitoring technologies and extensive interpretation of multiple databases. Critical Care Time devoted to patient care services described in this note is 45 minutes.   Billy Fischer, MD Pulmonary and Critical Care Medicine [facility] Pager: [phone]  05/03/2013, 3:40 PM

## 2013-05-03 NOTE — Progress Notes (Signed)
Inpatient Diabetes Program Recommendations  AACE/ADA: New Consensus Statement on Inpatient Glycemic Control (2013)  Target Ranges:  Prepandial:   less than 140 mg/dL      Peak postprandial:   less than 180 mg/dL (1-2 hours)      Critically ill patients:  140 - 180 mg/dL   Hyperglycemia  Inpatient Diabetes Program Recommendations HgbA1C: Please check A1C. Please consider increase to resistant correction q 4 hrs. Home medications for diabetes/

## 2013-05-03 NOTE — Progress Notes (Signed)
Portable EEG completed

## 2013-05-03 NOTE — Progress Notes (Signed)
UR completed 

## 2013-05-04 ENCOUNTER — Inpatient Hospital Stay (HOSPITAL_COMMUNITY): Payer: Medicaid Other

## 2013-05-04 DIAGNOSIS — G40401 Other generalized epilepsy and epileptic syndromes, not intractable, with status epilepticus: Secondary | ICD-10-CM

## 2013-05-04 LAB — GLUCOSE, CAPILLARY
Glucose-Capillary: 232 mg/dL — ABNORMAL HIGH (ref 70–99)
Glucose-Capillary: 176 mg/dL — ABNORMAL HIGH (ref 70–99)
Glucose-Capillary: 218 mg/dL — ABNORMAL HIGH (ref 70–99)

## 2013-05-04 LAB — BASIC METABOLIC PANEL
BUN: 43 mg/dL — ABNORMAL HIGH (ref 6–23)
Creatinine, Ser: 2.1 mg/dL — ABNORMAL HIGH (ref 0.50–1.10)
GFR calc Af Amer: 29 mL/min — ABNORMAL LOW (ref >90)
GFR calc non Af Amer: 25 mL/min — ABNORMAL LOW (ref >90)

## 2013-05-04 LAB — CBC
MCHC: 32 g/dL (ref 30.0–36.0)
Platelets: 182 10*3/uL (ref 150–400)
RDW: 13 % (ref 11.5–15.5)
WBC: 9.5 10*3/uL (ref 4.0–10.5)

## 2013-05-04 LAB — EXPECTORATED SPUTUM ASSESSMENT W GRAM STAIN, RFLX TO RESP C

## 2013-05-04 MED ORDER — SODIUM CHLORIDE 0.9 % IV SOLN
20.0000 mg/kg | Freq: Once | INTRAVENOUS | Status: AC
  Administered 2013-05-04: 2160 mg via INTRAVENOUS
  Filled 2013-05-04: qty 43.2

## 2013-05-04 MED ORDER — FENTANYL CITRATE 0.05 MG/ML IJ SOLN: 25.0000 ug | INTRAMUSCULAR | Status: DC | PRN

## 2013-05-04 MED ORDER — MIDAZOLAM HCL 2 MG/2ML IJ SOLN: 1.0000 mg | INTRAMUSCULAR | Status: DC | PRN

## 2013-05-04 MED ORDER — VITAL AF 1.2 CAL PO LIQD
1000.0000 mL | ORAL | Status: DC
  Administered 2013-05-04 – 2013-05-05 (×2): 1000 mL
  Filled 2013-05-04 (×2): qty 1000

## 2013-05-04 MED ORDER — SODIUM CHLORIDE 0.9 % IJ SOLN
10.0000 mL | INTRAMUSCULAR | Status: DC | PRN
  Administered 2013-06-05 (×3): 10 mL
  Administered 2013-06-07 – 2013-06-12 (×6): 20 mL

## 2013-05-04 MED ORDER — SODIUM CHLORIDE 0.9 % IV SOLN
200.0000 mg | Freq: Two times a day (BID) | INTRAVENOUS | Status: DC
  Administered 2013-05-05 – 2013-05-15 (×21): 200 mg via INTRAVENOUS
  Filled 2013-05-04 (×43): qty 20

## 2013-05-04 MED ORDER — PANTOPRAZOLE SODIUM 40 MG PO PACK
40.0000 mg | PACK | Freq: Every day | ORAL | Status: DC
  Administered 2013-05-04 – 2013-05-07 (×4): 40 mg
  Filled 2013-05-04 (×6): qty 20

## 2013-05-04 MED ORDER — POTASSIUM CHLORIDE 20 MEQ/15ML (10%) PO LIQD
40.0000 meq | Freq: Once | ORAL | Status: AC
  Administered 2013-05-04: 40 meq
  Filled 2013-05-04: qty 30

## 2013-05-04 MED ORDER — PHENYTOIN SODIUM 50 MG/ML IJ SOLN
100.0000 mg | Freq: Three times a day (TID) | INTRAMUSCULAR | Status: DC
  Administered 2013-05-05 – 2013-05-10 (×17): 100 mg via INTRAVENOUS
  Filled 2013-05-04 (×20): qty 2

## 2013-05-04 MED ORDER — INSULIN GLARGINE 100 UNIT/ML ~~LOC~~ SOLN
10.0000 [IU] | Freq: Every day | SUBCUTANEOUS | Status: DC
  Administered 2013-05-04: 10 [IU] via SUBCUTANEOUS
  Filled 2013-05-04 (×2): qty 0.1

## 2013-05-04 MED ORDER — SODIUM CHLORIDE 0.9 % IV SOLN
150.0000 mg | Freq: Two times a day (BID) | INTRAVENOUS | Status: DC
  Administered 2013-05-04 (×2): 150 mg via INTRAVENOUS
  Filled 2013-05-04 (×3): qty 15

## 2013-05-04 NOTE — Progress Notes (Signed)
INITIAL NUTRITION ASSESSMENT  DOCUMENTATION CODES Per approved criteria  -Obesity Unspecified   INTERVENTION:  Recommend Glucerna 1.2 formula at goal rate of 20 ml/hr with Prostat liquid protein 60 ml 4 times daily to provide 1376 kcals (74% of estimated kcal needs), 149 gm protein (100% of estimated protein needs), 386 ml of free water  Recommend MVI daily via tube RD to follow for nutrition care plan  NUTRITION DIAGNOSIS: Inadequate oral intake related to inability to eat as evidenced by NPO status  Goal: EN to provide 60-70% of estimated calorie needs (22-25 kcals/kg ideal body weight) and 100% of estimated protein needs, based on ASPEN guidelines for permissive underfeeding in critically ill obese individuals, currently unmet  Monitor:  EN regimen & tolerance, respiratory status, weight, labs, I/O's  Reason for Assessment: VDRF, Low Braden  57 y.o. female  Admitting Dx: status epilepticus  ASSESSMENT: Patient with PMH of CVAs/TIAs and diastolic heart failure admitted to [facility] with acute dyspnea and suspected CHF exacerbation; developed neurological findings consistent with acute CVA; MRI confirmed small acute infarcts of the right parietal lobe ---> transferred to [facility] for further management.  Patient is currently intubated on ventilator support MV: 9.2 Temp: 37.0  Vital AF 1.2 formula initiated per MD and currently infusing at 30 ml/hr via OGT providing 864 kcals, 54 gm protein, 584 ml of free water.  Patient off sedation however remains unresponsive at this time.  Height: Ht Readings from Last 1 Encounters:  05/02/13 6' (1.829 m)    Weight: Wt Readings from Last 1 Encounters:  05/02/13 238 lb 1.6 oz (108 kg)    Ideal Body Weight: 160 lb  % Ideal Body Weight: 148%  Wt Readings from Last 10 Encounters:  05/02/13 238 lb 1.6 oz (108 kg)    Usual Body Weight: unable to obtain  % Usual Body Weight: ---  BMI:  Body mass index is 32.28  kg/(m^2).  Estimated Nutritional Needs: Kcal: 1950 Protein: 145-155 gm Fluid: per MD  Skin: Intact  Diet Order: NPO  EDUCATION NEEDS: -No education needs identified at this time   Intake/Output Summary (Last 24 hours) at 05/04/13 1409 Last data filed at 05/04/13 1100  Gross per 24 hour  Intake 1627.33 ml  Output    625 ml  Net 1002.33 ml    Last BM: 7/9  Labs:   Recent Labs Lab 05/02/13 2200 05/04/13 0540  NA 139 143  K 3.9 3.5  CL 103 108  CO2 25 25  BUN 38* 43*  CREATININE 2.81* 2.10*  CALCIUM 8.1* 8.6  MG 1.8  --   PHOS 3.6  --   GLUCOSE 194* 248*    CBG (last 3)   Recent Labs  05/04/13 0423 05/04/13 0840 05/04/13 1244  GLUCAP 206* 232* 176*    Scheduled Meds: . antiseptic oral rinse  1 application Mouth Rinse QID  . aspirin  325 mg Per Tube Daily  . chlorhexidine  15 mL Mouth/Throat BID  . Chlorhexidine Gluconate Cloth  6 each Topical Q0600  . insulin aspart  0-15 Units Subcutaneous Q4H  . insulin glargine  10 Units Subcutaneous QHS  . lacosamide (VIMPAT) IV  150 mg Intravenous Q12H  . levETIRAcetam  1,000 mg Intravenous Q12H  . mupirocin ointment  1 application Nasal BID  . pantoprazole sodium  40 mg Per Tube Q1200  . potassium chloride  40 mEq Per Tube Once    Continuous Infusions: . sodium chloride 10 mL/hr at 05/02/13 2210  . feeding  supplement (VITAL AF 1.2 CAL) Stopped (05/04/13 0930)    Past Medical History  Diagnosis Date  . Stroke   . Diabetes mellitus without complication   . Hypertension   . CHF (congestive heart failure)     History reviewed. No pertinent past surgical history.  Maureen Chatters, RD, LDN Pager #: [phone] After-Hours Pager #: [phone]

## 2013-05-04 NOTE — Progress Notes (Signed)
Stroke Team Progress Note  HISTORY Brenda Pope is an 57 y.o. female with a past medical history significant for HTN, DM, hyperlipidemia, chronic diastolic heart failure, strokes x3 without residual deficits, transferred to [facility] 05/02/2013  from [facility] for further management. Of status epilepticus in setting of acute stroke.   She was admitted to [facility] on with a syndrome considered to be consistent with CHF exacerbation/pulmonary edema. Then, she was noted to be less responsive and developed left facial droop, left sided weakness, and slurred speech, and subsequently had to be intubated. In addition, she later on started having twitching movements of the left UE and nystagmus that were thought to be related to focal seizures and she was loaded with IV keppra. Work up at [employer] include MRI brain that disclosed several small acute infarcts in the right parietal area. Carotid doppler and TTE were reported as unremarkable. Brenda Pope is now off sedatives and remains unresponsive. Patient was not a TPA candidate upon arrival to [facility] secondary to delay in arrival. Repeat MRI scan of the brain 05/03/13 shows significantly enlarged mostly right MCA territory infarcts with smaller infarct in the right PCA as well as left MCA territory as well.  EEG 05/03/13 shows continuous sharp activity with slow waves in the right parietal region worrisome for PLEDS. 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  SUBJECTIVE Her husband and sister are at the bedside.  Overall she feels her condition is gradually worsening. Hx strokes per husband - seen at both [place] and [place]. Husband unaware of etiology of strokes. [place] Spaniel they were referred to an urgent care for further tx. He does report that she is followed by a neurologist for her brain stem stroke.  OBJECTIVE Most recent Vital Signs: Filed  Vitals:   05/04/13 0500 05/04/13 0600 05/04/13 0700 05/04/13 0807  BP: 147/68 154/67 154/63 160/61  Pulse: 62 62 62 66  Temp:      TempSrc:      Resp: 14 15 14 16   Height:      Weight:      SpO2: 100% 99% 100% 99%   CBG (last 3)   Recent Labs  05/03/13 1940 05/04/13 0013 05/04/13 0423  GLUCAP 250* 204* 206*    IV Fluid Intake:   . sodium chloride 10 mL/hr at 05/02/13 2210  . sodium chloride 50 mL/hr at 05/04/13 9147    MEDICATIONS  . antiseptic oral rinse  1 application Mouth Rinse QID  . aspirin  300 mg Rectal Daily  . aspirin  325 mg Per Tube Daily  . chlorhexidine  15 mL Mouth/Throat BID  . Chlorhexidine Gluconate Cloth  6 each Topical Q0600  . feeding supplement (VITAL AF 1.2 CAL)  1,000 mL Per Tube Q24H  . insulin aspart  0-15 Units Subcutaneous Q4H  . lacosamide (VIMPAT) IV  100 mg Intravenous Q12H  . levETIRAcetam  1,000 mg Intravenous Q12H  . mupirocin ointment  1 application Nasal BID  . pantoprazole (PROTONIX) IV  40 mg Intravenous Q24H   PRN:  fentaNYL, midazolam  Diet:  NPO  Activity:  Bedrest DVT Prophylaxis:  SCDs   CLINICALLY SIGNIFICANT STUDIES Basic Metabolic Panel:   Recent Labs Lab 05/02/13 2200 05/04/13 0540  NA 139 143  K 3.9 3.5  CL 103 108  CO2 25 25  GLUCOSE 194* 248*  BUN 38* 43*  CREATININE 2.81* 2.10*  CALCIUM 8.1* 8.6  MG 1.8  --   PHOS 3.6  --    Liver Function Tests:   Recent Labs Lab 05/02/13 2200  AST 18  ALT 10  ALKPHOS 74  BILITOT 0.3  PROT 5.7*  ALBUMIN 2.2*   CBC:   Recent Labs Lab 05/02/13 2200 05/04/13 0540  WBC 15.1* 9.5  NEUTROABS 12.5*  --   HGB 11.0* 10.6*  HCT 33.0* 33.1*  MCV 90.4 92.5  PLT 187 182   Coagulation:   Recent Labs Lab 05/02/13 2200  LABPROT 14.8  INR 1.19   Cardiac Enzymes:   Recent Labs Lab 05/04/13 0540  TROPONINI <0.30   Urinalysis: No results found for this basename: COLORURINE, APPERANCEUR, LABSPEC, PHURINE, GLUCOSEU, HGBUR, BILIRUBINUR, KETONESUR,  PROTEINUR, UROBILINOGEN, NITRITE, LEUKOCYTESUR,  in the last 168 hours Lipid Panel    Component Value Date/Time   CHOL 126 05/03/2013 0435   TRIG 153* 05/03/2013 0435   HDL 32* 05/03/2013 0435   CHOLHDL 3.9 05/03/2013 0435   VLDL 31 05/03/2013 0435   LDLCALC 63 05/03/2013 0435   HgbA1C  Lab Results  Component Value Date   HGBA1C 10.4* 05/03/2013    Urine Drug Screen:   No results found for this basename: labopia,  cocainscrnur,  labbenz,  amphetmu,  thcu,  labbarb    Alcohol Level: No results found for this basename: ETH,  in the last 168 hours  CT of the brain    MRI of the brain  05/03/2013    1. Confluent and scattered right MCA and right PCA acute infarcts, plus scattered small left hemisphere and occasional posterior fossa acute infarcts. The vast majority of these infarcts are newly seen since 05/01/2013.  This appearance suggests embolic phenomena which severely affected more proximal/medium-sized vessels of the right MCA and PCA territories. 2.  Cytotoxic edema without significant mass effect at this time. No associated hemorrhage.  MRA of the brain  05/03/2013  1.  Negative anterior circulation.  No major MCA branch occlusion identified. 2.  Right worse than left PCA P2 segment irregularity and stenoses. Stenosis of the right PCA is severe, but there is preserved bilateral distal PCA flow. 3.  No intracranial distal right vertebral artery is identified, but favor this is related to anatomic variation, with dominant distal left vertebral artery and right AICA (dominant and duplicated).   2D Echocardiogram  At [place], EF 55%, no source of embolus  Carotid Doppler  At [facility], unremarkable per report  CXR  05/02/2013    Support devices as above.  Bibasilar atelectasis.    EKG  normal sinus rhythm.   Therapy Recommendations   Physical Exam   Middle-age Caucasian lady who is  Intubated . Afebrile. Head is nontraumatic. Neck is supple without bruit.  . Cardiac exam no murmur or gallop. Lungs  are clear to auscultation. Distal pulses are well felt. Trace pedal edema. Neurological Exam ; comatose unresponsive.Eyes closed. Pupils 3 mm equal reactive. Right gaze preference but able to move eyes spontaneously to the midline. Fundi were not visualized. Face is symmetric. Tongue is midline. Motor system exam dense left hemiplegia with decreased tone. Intermittent transient twitching noted of the left thumb corresponding with eyes moving to the left upper midline. This lasted on the few seconds. Purposeful right sided movements to sternal rub with patient  Reaching for the endotracheal tube. Right plantar is downgoing and left is upgoing. ASSESSMENT Brenda Pope is a 57 y.o. female presenting as a transfer from [place] with  acute seizure and right MCA infarcts. Imaging confirms  bilateral right greater than left parietal infarcts, various ages, some confluent. Infarcts felt to be embolic secondary to unknown etiology. Patient also with ongoing acute status epilepticus with symptomatic seizure/twitching noted in the left thumb.  On aspirin 81 mg orally every day and clopidogrel 75 mg orally every day prior to admission. Now on aspirin 325 mg per tube daily for secondary stroke prevention. Patient with resultant VDRF, ongoing seizures, left hemiparesis, comatose state. Work up underway.  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  obstructive sleep apnea  Hospital day # 2  TREATMENT/PLAN  Continue aspirin 325 mg per tube daily for secondary stroke prevention.  TEE ordered for today, pt still receiving tube feedings, unsure if it is on endo/card schedule. RN to check  Repeat EEG  Increase Vimpat to 150 mg bid. D/w Dr Amada Jupiter who will check EEG and  follow patient later today  This patient is critically ill and at significant risk of neurological worsening and death. Patient care requires constant monitoring of vital signs, hemodynamics, respiratory and cardiac monitoring, and neurological assessment. Discussion with family, other specialists about plan of care. Medical decision making of high complexity. Dr. Pearlean Brownie spent 30 minutes of neurocritical care time in the care of this patient.    Dr. Pearlean Brownie discussed diagnosis, prognosis,  treatment options and plan of care with  Husband, sister, daughter and rest of family.    Annie Main, MSN, RN, ANVP-BC, ANP-BC, [facility] Pager: [phone] 05/04/2013 9:07 AM  I have personally obtained a history, examined the patient, evaluated imaging results, and formulated the assessment and plan of care. I agree with the above. Delia Heady, MD

## 2013-05-04 NOTE — Progress Notes (Signed)
At the time of the review of the EEG, the patient was loaded with fosphenytoin, no further clinical seizure activity, will connect to continuous EEG monitoring and treat further according to these results.   [name], MD [facility] [phone]  If 7pm- 7am, please page neurology on call at [phone].

## 2013-05-04 NOTE — Progress Notes (Signed)
Inpatient Diabetes Program Recommendations  AACE/ADA: New Consensus Statement on Inpatient Glycemic Control (2013)  Target Ranges:  Prepandial:   less than 140 mg/dL      Peak postprandial:   less than 180 mg/dL (1-2 hours)      Critically ill patients:  140 - 180 mg/dL   Inpatient Diabetes Program Recommendations HgbA1C: done, thank you  Hyperglycemia on tube feeds Pt would benefit from addition of tube feed coverage which at 30 ml/hr of Vital AF, would require 3 units tube feed coverage q 4 hrs. Can increase coverage as tube feed rate increases. (will follow)

## 2013-05-04 NOTE — Progress Notes (Signed)
EEG Completed; Results Pending  

## 2013-05-04 NOTE — Progress Notes (Signed)
PULMONARY  / CRITICAL CARE MEDICINE  Name: Brenda Pope MRN: [id number] DOB: September 27, 1956    ADMISSION DATE:  05/02/2013 CONSULTATION DATE:  7/7/104  REFERRING MD :  Transfer from [facility] PRIMARY SERVICE:  PCCM  CHIEF COMPLAINT:  Status epilepticus  BRIEF PATIENT DESCRIPTION: 57 yo with past medical history of CVAs / TIAs and diastolic heart failure admitted to [facility] on 7/5 with acute dyspnea and suspected CHF exacerbation.  Later on the day of admission developed developed neurological findings consistent with acute CVA.  On 7/6 intubated for airway protection during suspected seizure activity.  Hypotensive post intubation requiring vasopressors. MRI confirmed small acute infarcts of the right parietal lobe. On 7/7 transferred to [facility] for further management.  SIGNIFICANT EVENTS / STUDIES:  7/5  Admitted to [facility] with CHF exacerbation; stroke symptoms 7/6  Suspected seizure activity, intubated for airway protection; hypotensive post intubation requiring vasopressors 7/6  Brain MRI:  Multiple small acute infarcts R parietal lobe, possible small infarcts left basal ganglia and central pons 7/6  TTE:  No source of CVA, EF 55-60% 7/6  Carotid Doppler:  No evidence of hemodynamically significant stenosis 7/7  Transferred to [facility], PCCM serivce 7/7 Neuro consult: "Concern patient is on SE in the context of recent cortical stroke." 7/8 MRI brain: Confluent and scattered right MCA and right PCA acute infarcts, plus scattered small left hemisphere and occasional posterior fossa acute infarcts. The vast majority of these infarcts are newly seen since 05/01/2013.  This appearance suggests embolic phenomena which severely affected more proximal/medium-sized vessels of the right MCA and PCA territories. Cytotoxic edema without significant mass effect at this time. No associated hemorrhage 7/8 EEG: severe diffuse encephalopathic process with asymmetrical involvement. Findings  reported from the left hemisphere were nonspecific. Findings record from the right hemisphere were consistent with acute large area of infarction. No frank epileptiform activity was recorded. PLEDs I'm not in and of themselves considered epileptic, most often seen with acute large hemispheric strokes 7/9 EEG:   LINES / TUBES: ETT 7/6 >>  R IJ CVL 7/6 >>   CULTURES: MRSA PCR 7/07 >> POS   ANTIBIOTICS:   SUBJ: Remains comatose.   VITAL SIGNS: Temp:  [97.9 F (36.6 C)-100 F (37.8 C)] 100 F (37.8 C) (07/09 1539) Pulse Rate:  [56-76] 76 (07/09 1541) Resp:  [14-16] 16 (07/09 1541) BP: (124-184)/(53-81) 180/77 mmHg (07/09 1541) SpO2:  [99 %-100 %] 100 % (07/09 1541) FiO2 (%):  [40 %] 40 % (07/09 1541)  HEMODYNAMICS: CVP:  [9 mmHg-11 mmHg] 9 mmHg VENTILATOR SETTINGS: Vent Mode:  [-] CPAP;PSV FiO2 (%):  [40 %] 40 % Set Rate:  [14 bmp] 14 bmp Vt Set:  [500 mL] 500 mL PEEP:  [5 cmH20] 5 cmH20 Pressure Support:  [8 cmH20] 8 cmH20 Plateau Pressure:  [12 cmH20-14 cmH20] 14 cmH20  INTAKE / OUTPUT: Intake/Output     07/08 0701 - 07/09 0700 07/09 0701 - 07/10 0700   I.V. (mL/kg) 1265.8 (11.7) 250 (2.3)   NG/GT 322.3 140   IV Piggyback 280 150   Total Intake(mL/kg) 1868.2 (17.3) 540 (5)   Urine (mL/kg/hr) 890 (0.3) 425 (0.4)   Emesis/NG output 50 (0)    Total Output 940 425   Net +928.2 +115        Stool Occurrence  1 x     PHYSICAL EXAMINATION: General: comatose, NAD Neuro: no spont movement, no w/d from pain HEENT: PERRL, EOMI Cardiovascular:  RRR s M Lungs: clear Abdomen:  Soft, nontender, bowel sounds diminished Musculoskeletal: warm, no edema  LABS:  Recent Labs Lab 05/02/13 2123 05/02/13 2200 05/03/13 0440 05/04/13 0540  HGB  --  11.0*  --  10.6*  WBC  --  15.1*  --  9.5  PLT  --  187  --  182  NA  --  139  --  143  K  --  3.9  --  3.5  CL  --  103  --  108  CO2  --  25  --  25  GLUCOSE  --  194*  --  248*  BUN  --  38*  --  43*  CREATININE  --   2.81*  --  2.10*  CALCIUM  --  8.1*  --  8.6  MG  --  1.8  --   --   PHOS  --  3.6  --   --   AST  --  18  --   --   ALT  --  10  --   --   ALKPHOS  --  74  --   --   BILITOT  --  0.3  --   --   PROT  --  5.7*  --   --   ALBUMIN  --  2.2*  --   --   APTT  --  29  --   --   INR  --  1.19  --   --   TROPONINI  --   --   --  <0.30  PROCALCITON  --  0.17  --   --   PHART 7.434  --  7.427  --   PCO2ART 35.1  --  38.1  --   PO2ART 168.0*  --  127.0*  --     Recent Labs Lab 05/04/13 0013 05/04/13 0423 05/04/13 0840 05/04/13 1244 05/04/13 1538  GLUCAP 204* 206* 232* 176* 218*   CXR:  NACPD  ASSESSMENT / PLAN:  PULMONARY A:  Acute respiratory failure due to AMS P:   Cont vent support Vent bundle Daily SBT as indicated  CARDIOVASCULAR A: Transient hypotension, resolved H/O CHF - compensated Chronic hypertension P:  Monitor In setting of acute CVAs, will allow BP to run high  RENAL A:  Acute renal failure, unclear etiology, nonoliguric P:   COnt IVFs Monitor chemistries Correct electrolytes as indicated  GASTROINTESTINAL A:  No active issues. P:   Begin TFs Cont SUP  HEMATOLOGIC A:  Mild anemia. P:  Monitor CBC SCDs for DVT Px  INFECTIOUS A:  No overt infections noted P:   Monitor off abx  ENDOCRINE  A:  DM2. Hyperglycemia. P:   Cont SSI Add Lantus 7/09   NEUROLOGIC A:  Acute CVA in multiple territories  - concern for embolic Acute encephalopathy Possible seizure 7/06 P:   TEE ordered 7/8 Neuro input appreciated Anti-convulsants per Neuro   I have personally obtained a history, examined the patient, evaluated laboratory and imaging results, formulated the assessment and plan and placed orders.  CRITICAL CARE:  The patient is critically ill with multiple organ systems failure and requires high complexity decision making for assessment and support, frequent evaluation and titration of therapies, application of advanced monitoring  technologies and extensive interpretation of multiple databases. Critical Care Time devoted to patient care services described in this note is 45 minutes.   I updated family in detail and they understand prognosis remains guarded for now. We agree to cont full aggressive  support  Billy Fischer, MD Pulmonary and Critical Care Medicine [facility] Pager: [phone]  05/04/2013, 4:58 PM

## 2013-05-04 NOTE — Progress Notes (Signed)
Pt noted to have increased BP over the last several hours with SBP ranging from 170-180 mmHg.  Dr Sung Amabile notified and no new orders rec'd. Debraann Livingstone, [facility]

## 2013-05-04 NOTE — Procedures (Signed)
History: 57 yo F with multiple strokes and concern for seizure activity.   Sedation: None  Background: The EEG consists of irregular delta and theta activities with intermittent discharges consisting of a sharp and slow wave with superimposed faster activity occurring at a frequency of once every 2 - 3 seconds(PLEDs plus). In addition there are periods of rhythmic activity starting in the right hemisphere most prominent at T8, P8 but with a wide field at onset. These last from 1 - 3 mintues with a 1 - 3 minute period of the PLEDs-plus pattern between ictal discharges.     Photic stimulation: Physiologic driving is not performed.   EEG Abnormalities: 1) Recurrent seizure activity(most prominent at T8,P8) in the right hemisphere with PLEDs-plus pattern in between seizures.   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  [name], MD [facility] [phone]  If 7pm- 7am, please page neurology on call at [phone].

## 2013-05-05 ENCOUNTER — Inpatient Hospital Stay (HOSPITAL_COMMUNITY): Payer: Medicaid Other

## 2013-05-05 LAB — CSF CELL COUNT WITH DIFFERENTIAL

## 2013-05-05 LAB — GLUCOSE, CAPILLARY
Glucose-Capillary: 195 mg/dL — ABNORMAL HIGH (ref 70–99)
Glucose-Capillary: 177 mg/dL — ABNORMAL HIGH (ref 70–99)
Glucose-Capillary: 170 mg/dL — ABNORMAL HIGH (ref 70–99)

## 2013-05-05 LAB — GRAM STAIN

## 2013-05-05 LAB — SEDIMENTATION RATE: Sed Rate: 103 mm/hr — ABNORMAL HIGH (ref 0–22)

## 2013-05-05 LAB — PROTEIN AND GLUCOSE, CSF: Total  Protein, CSF: 86 mg/dL — ABNORMAL HIGH (ref 15–45)

## 2013-05-05 LAB — PHENYTOIN LEVEL, TOTAL: Phenytoin Lvl: 18.6 ug/mL (ref 10.0–20.0)

## 2013-05-05 MED ORDER — METHYLPREDNISOLONE SODIUM SUCC 1000 MG IJ SOLR
1000.0000 mg | INTRAMUSCULAR | Status: DC
  Administered 2013-05-05 – 2013-05-10 (×6): 1000 mg via INTRAVENOUS
  Filled 2013-05-05 (×6): qty 8

## 2013-05-05 MED ORDER — PENTOBARBITAL LOAD VIA INFUSION
10.0000 mg/kg | Freq: Once | INTRAVENOUS | Status: AC
  Administered 2013-05-05: 1080 mg via INTRAVENOUS

## 2013-05-05 MED ORDER — PENTOBARBITAL SODIUM 50 MG/ML IJ SOLN
3.0000 mg/kg/h | INTRAVENOUS | Status: DC
  Administered 2013-05-05 – 2013-05-06 (×2): 2 mg/kg/h via INTRAVENOUS
  Administered 2013-05-06: 4 mg/kg/h via INTRAVENOUS
  Administered 2013-05-06 – 2013-05-07 (×2): 3 mg/kg/h via INTRAVENOUS
  Administered 2013-05-07: 2.5 mg/kg/h via INTRAVENOUS
  Administered 2013-05-07: 3 mg/kg/h via INTRAVENOUS
  Filled 2013-05-05 (×9): qty 50

## 2013-05-05 MED ORDER — PROPOFOL 10 MG/ML IV EMUL
5.0000 ug/kg/min | INTRAVENOUS | Status: DC
  Administered 2013-05-05 (×4): 100 ug/kg/min via INTRAVENOUS
  Administered 2013-05-05: 50 ug/kg/min via INTRAVENOUS
  Administered 2013-05-05: 100 ug/kg/min via INTRAVENOUS
  Administered 2013-05-05: 75 ug/kg/min via INTRAVENOUS
  Filled 2013-05-05: qty 100
  Filled 2013-05-05: qty 200
  Filled 2013-05-05 (×4): qty 100

## 2013-05-05 MED ORDER — PHENYTOIN SODIUM 50 MG/ML IJ SOLN: 100.0000 mg | Freq: Three times a day (TID) | INTRAMUSCULAR | Status: DC

## 2013-05-05 MED ORDER — VITAL AF 1.2 CAL PO LIQD
1000.0000 mL | ORAL | Status: DC
  Filled 2013-05-05: qty 1000

## 2013-05-05 MED ORDER — NOREPINEPHRINE BITARTRATE 1 MG/ML IJ SOLN
2.0000 ug/min | INTRAVENOUS | Status: DC
  Administered 2013-05-05: 5 ug/min via INTRAVENOUS
  Administered 2013-05-05: 7 ug/min via INTRAVENOUS
  Administered 2013-05-05: 5 ug/min via INTRAVENOUS
  Administered 2013-05-06: 4 ug/min via INTRAVENOUS
  Administered 2013-05-07: 3 ug/min via INTRAVENOUS
  Administered 2013-05-08: 3.5 ug/min via INTRAVENOUS
  Filled 2013-05-05 (×5): qty 4

## 2013-05-05 MED ORDER — INSULIN REGULAR HUMAN 100 UNIT/ML IJ SOLN
INTRAMUSCULAR | Status: DC
  Administered 2013-05-05: 4.1 [IU]/h via INTRAVENOUS
  Administered 2013-05-06: 5 [IU]/h via INTRAVENOUS
  Administered 2013-05-07: 6.9 [IU]/h via INTRAVENOUS
  Administered 2013-05-07 – 2013-05-09 (×3): via INTRAVENOUS
  Administered 2013-05-10: 6.4 [IU]/h via INTRAVENOUS
  Filled 2013-05-05 (×7): qty 1

## 2013-05-05 NOTE — Progress Notes (Signed)
NUTRITION FOLLOW UP  Intervention:    Decrease Vital AF 1.2 to 10 ml/h to provide 288 kcals, 18 gm protein, 195 ml free water daily.  Will be unable to meet nutrition goals at this time due to high rate of Propofol.  RD to follow-up tomorrow and adjust TF as needed.  Nutrition Dx:   Inadequate oral intake related to inability to eat as evidenced by NPO status  Goal:   Enteral nutrition to provide 60-70% of estimated calorie needs (22-25 kcals/kg ideal body weight) and 100% of estimated protein needs, based on ASPEN guidelines for permissive underfeeding in critically ill obese individuals.  Monitor:   TF tolerance/adequacy, labs, weight trend, vent status.  Assessment:   Patient remains intubated on ventilator support.  MV: 6.9  Temp:Temp (24hrs), Avg:98.9 F (37.2 C), Min:98.3 F (36.8 C), Max:100 F (37.8 C)  Propofol: 64.8 ml/hr providing 1711 kcals/day.   Current rate of Propofol is providing ~ 85% of estimated calorie needs. RN is hopeful that Propofol rate can be decreased tomorrow. Spoke with CCM physician Vassie Loll); okay for RD to adjust TF rate/type to better meet estimated nutrition needs. Adult TF Protocol initiated by RD.    Height: Ht Readings from Last 1 Encounters:  05/02/13 6' (1.829 m)    Weight Status:   Wt Readings from Last 1 Encounters:  05/02/13 238 lb 1.6 oz (108 kg)    Re-estimated needs:  Kcal: 2025 Protein: 145 gm protein Fluid: 2-2.2 L   Skin: no issues  Diet Order: NPO  TF Order: Vital AF 1.2 at 30 ml/h providing 864 kcals, 54 gm protein, 584 ml free water daily   Intake/Output Summary (Last 24 hours) at 05/05/13 1533 Last data filed at 05/05/13 1500  Gross per 24 hour  Intake 1837.43 ml  Output    810 ml  Net 1027.43 ml    Last BM: 7/10   Labs:   Recent Labs Lab 05/02/13 2200 05/04/13 0540  NA 139 143  K 3.9 3.5  CL 103 108  CO2 25 25  BUN 38* 43*  CREATININE 2.81* 2.10*  CALCIUM 8.1* 8.6  MG 1.8  --   PHOS 3.6   --   GLUCOSE 194* 248*    CBG (last 3)   Recent Labs  05/05/13 0412 05/05/13 0813 05/05/13 1259  GLUCAP 282* 277* 280*    Scheduled Meds: . antiseptic oral rinse  1 application Mouth Rinse QID  . aspirin  325 mg Per Tube Daily  . chlorhexidine  15 mL Mouth/Throat BID  . Chlorhexidine Gluconate Cloth  6 each Topical Q0600  . lacosamide (VIMPAT) IV  200 mg Intravenous Q12H  . levETIRAcetam  1,000 mg Intravenous Q12H  . methylPREDNISolone (SOLU-MEDROL) injection  1,000 mg Intravenous Q24H  . mupirocin ointment  1 application Nasal BID  . pantoprazole sodium  40 mg Per Tube Q1200  . phenytoin (DILANTIN) IV  100 mg Intravenous Q8H    Continuous Infusions: . sodium chloride 20 mL/hr at 05/05/13 1300  . feeding supplement (VITAL AF 1.2 CAL) 1,000 mL (05/05/13 0600)  . insulin (NOVOLIN-R) infusion 4.1 Units/hr (05/05/13 1500)  . norepinephrine (LEVOPHED) Adult infusion 5 mcg/min (05/05/13 1332)  . propofol 100 mcg/kg/min (05/05/13 1503)    Joaquin Courts, RD, LDN, CNSC Pager [phone] After Hours Pager [phone]

## 2013-05-05 NOTE — Progress Notes (Signed)
Nursing 0745 Will start vasopressor and Propofol gtt at 75mcg/kg/min per Dr. Amada Jupiter d/t seizure activity on EEG.  Levophed ordered per Dr. Sung Amabile.  Keep SBP >120 per Dr. Amada Jupiter.  Will start medications and continue to assess.

## 2013-05-05 NOTE — Progress Notes (Signed)
Nursing 1045 Dr. Amada Jupiter to bedside and aware that CT is complete, but that EEG probes had to be removed for the scan per the radiologist.  Attempt at CT with EEG probes on resulted in a poor image.  MD verbal order to increase Propofol to 127mcg/kg/min, medication increased per order.  Will continue to assess.

## 2013-05-05 NOTE — Progress Notes (Signed)
Nursing 0830 Dr. Amada Jupiter to bedside, verbal order to increase Propofol to 77mcg/kg/min.  Medication increased per order.  MD aware that patient has had a HR in the 50s since Levophed and Propofol started. Will continue to assess and notify MD if HR drops further.

## 2013-05-05 NOTE — Procedures (Signed)
Indication: Concern for vasculitis  Risks of the procedure were dicussed with the patient including post-LP headache, bleeding, infection, weakness/numbness of legs(radiculopathy), death.  The patient/patient's proxy agreed and written consent was obtained.   The patient was prepped and draped, and using sterile technique a 20 gauge quinke spinal needle was inserted in the L3-4 space. The opening pressure was 23. Approximately 5 cc of CSF were obtained and sent for analysis.   Though this pressure is slightly elevated, the patient was ventilated and curled tightly for the procedure, therefore I am not sure of the significance of this elevation.    [name], MD [facility] [phone]  If 7pm- 7am, please page neurology on call at [phone].

## 2013-05-05 NOTE — Progress Notes (Addendum)
Subjective: Has continued to have seizures overnight consistent with partial status epilepticus.   Of note, she was having progressively worsening headaches for several weeks prior to admission. She also had SOB for approximately 2 weeks PTA.   Exam: Filed Vitals:   05/05/13 0615  BP: 137/76  Pulse: 68  Temp:   Resp: 15   Gen: In bed, NAD MS: Does not open eyes, does not follow commands.  CN: Pupils reactive bilaterally, though slightly more sluggish on right and slightly larger on right, corneals intact.  Motor: minimal withdrawal right upper and lower extremity, no purposeful movement. No left sided movement.  Sensory:as above  Impression: 57 yo F with recurrent strokes and status epieltpcius. Despite treatment overnight patient remained in [place]. Therefore I feel that burst suppression is needed at this time. Her multivascular infarcts without clear proximal stenosis is unusual. I am concerned for possible vasculitis and would favor empiric treatment at this time. It is certainly possible still as well that this represents recurrent embolic disease.   Given her slightly assymetric pupils, a stat CT was obtained, but this did not show any signs of mass effect.   Recommendations: 1) Propofol with goal of burst suppression 2) Solumedrol 1000mg  qday 3) Have discussed with ICU team, will start insulin while on steroids.  4) LP today 5) TEE ordrered.   [name], MD [facility] [phone]  If 7pm- 7am, please page neurology on call at [phone].    Despite repeated boluses with propofol, an adequate burst suppression pattern was unable to be obtained, therefore starting pentobarbital.   [name], MD [facility] [phone]  If 7pm- 7am, please page neurology on call at [phone].

## 2013-05-05 NOTE — Progress Notes (Signed)
Nursing 1600 Propofol 25mg  bolus given per Dr. Amada Jupiter.  Additional Propofol 25mg  bolus given at 1638 per Dr. Amada Jupiter.  Will continue to assess.

## 2013-05-05 NOTE — Progress Notes (Signed)
Inpatient Diabetes Program Recommendations  AACE/ADA: New Consensus Statement on Inpatient Glycemic Control (2013)  Target Ranges:  Prepandial:   less than 140 mg/dL      Peak postprandial:   less than 180 mg/dL (1-2 hours)      Critically ill patients:  140 - 180 mg/dL   Continued hyperglycemia on tube feedings In order to benefit from nutrients from tube feedings, please cover tube feeds with 3-4 units q 4 hrs in addition to correction q 4 hrs and lantus. Thank you, Lenor Coffin, RN, CNS, Diabetes Coordinator [phone])  Inpatient Diabetes Program Recommendations HgbA1C: done, thank you

## 2013-05-05 NOTE — Progress Notes (Signed)
Nursing 1735 Propofol 25mg  bolus given per Dr. Amada Jupiter.  Repeated in per MD.  BP stable throughout.

## 2013-05-05 NOTE — Procedures (Signed)
Arterial Catheter Insertion Procedure Note Melis Trochez [id number] 16-Feb-1956  Procedure: Insertion of Arterial Catheter  Indications: Blood pressure monitoring and Frequent blood sampling  Procedure Details Consent: Unable to obtain consent because of intubated and sedated. Time Out: Verified patient identification, verified procedure, site/side was marked, verified correct patient position, special equipment/implants available, medications/allergies/relevent history reviewed, required imaging and test results available.  Performed  Maximum sterile technique was used including antiseptics, cap, gloves, gown, hand hygiene, mask and sheet. Skin prep: Chlorhexidine;  22 gauge catheter was inserted into left radial artery using the Seldinger technique.  Evaluation Blood flow good; BP tracing good. Complications: No apparent complications.   Gilman Buttner 05/05/2013

## 2013-05-05 NOTE — Progress Notes (Signed)
PULMONARY  / CRITICAL CARE MEDICINE  Name: Brenda Pope MRN: [id number] DOB: May 01, 1956    ADMISSION DATE:  05/02/2013 CONSULTATION DATE:  7/7/104  REFERRING MD :  Transfer from [facility] PRIMARY SERVICE:  PCCM  CHIEF COMPLAINT:  Status epilepticus  BRIEF PATIENT DESCRIPTION: 57 yo with past medical history of CVAs / TIAs and diastolic heart failure admitted to [facility] on 7/5 with acute dyspnea and suspected CHF exacerbation.  Later on the day of admission developed developed neurological findings consistent with acute CVA.  On 7/6 intubated for airway protection during suspected seizure activity.  Hypotensive post intubation requiring vasopressors. MRI confirmed small acute infarcts of the right parietal lobe. On 7/7 transferred to [facility] for further management.  SIGNIFICANT EVENTS / STUDIES:  7/5  Admitted to [facility] with CHF exacerbation; stroke symptoms 7/6  Suspected seizure activity, intubated for airway protection; hypotensive post intubation requiring vasopressors 7/6  Brain MRI:  Multiple small acute infarcts R parietal lobe, possible small infarcts left basal ganglia and central pons 7/6  TTE:  No source of CVA, EF 55-60% 7/6  Carotid Doppler:  No evidence of hemodynamically significant stenosis 7/7  Transferred to [facility], PCCM serivce 7/7 Neuro consult: "Concern patient is on SE in the context of recent cortical stroke." 7/8 MRI brain: Confluent and scattered right MCA and right PCA acute infarcts, plus scattered small left hemisphere and occasional posterior fossa acute infarcts. The vast majority of these infarcts are newly seen since 05/01/2013.  This appearance suggests embolic phenomena which severely affected more proximal/medium-sized vessels of the right MCA and PCA territories. Cytotoxic edema without significant mass effect at this time. No associated hemorrhage 7/8 EEG: severe diffuse encephalopathic process with asymmetrical involvement. Findings  reported from the left hemisphere were nonspecific. Findings record from the right hemisphere were consistent with acute large area of infarction. No frank epileptiform activity was recorded. 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  7/10 LP performed by Neuro:  7/10 Burst suppression with propofol. Norepi initiated to prevent propofol induced hypotension. Consideration of primary CNS vasculitis. Serologies ordered>> CRP, ESR markedly elevated. High dose methylpred ordered. Insulin gtt ordered while on high dose steriods    LINES / TUBES: ETT 7/6 >>  R IJ CVL 7/6 >>   CULTURES: MRSA PCR 7/07 >> POS   ANTIBIOTICS:   SUBJ: Remains comatose.   VITAL SIGNS: Temp:  [98.3 F (36.8 C)-100.4 F (38 C)] 100.4 F (38 C) (07/10 1600) Pulse Rate:  [53-74] 60 (07/10 1800) Resp:  [14-22] 15 (07/10 1800) BP: (96-174)/(48-81) 144/53 mmHg (07/10 1800) SpO2:  [98 %-100 %] 99 % (07/10 1800) FiO2 (%):  [39.9 %-40.4 %] 40.2 % (07/10 1800)  HEMODYNAMICS: CVP:  [9 mmHg-11 mmHg] 9 mmHg VENTILATOR SETTINGS: Vent Mode:  [-] PRVC FiO2 (%):  [39.9 %-40.4 %] 40.2 % Set Rate:  [14 bmp] 14 bmp Vt Set:  [500 mL] 500 mL PEEP:  [5 cmH20] 5 cmH20 Plateau Pressure:  [14 cmH20-18 cmH20] 17 cmH20  INTAKE / OUTPUT: Intake/Output     07/09 0701 - 07/10 0700 07/10 0701 - 07/11 0700   I.V. (mL/kg) 550 (5.1) 1152 (10.7)   Other  40   NG/GT 670 270   IV Piggyback 300 213   Total Intake(mL/kg) 1520 (14.1) 1675 (15.5)   Urine (mL/kg/hr) 945 (0.4) 455 (0.4)  Emesis/NG output     Total Output 945 455   Net +575 +1220        Stool Occurrence 2 x      PHYSICAL EXAMINATION: General: comatose, NAD Neuro: no spont movement, no w/d from pain HEENT: PERRL, EOMI Cardiovascular:  RRR s M Lungs: clear Abdomen:  Soft, nontender,  bowel sounds diminished Musculoskeletal: warm, no edema  LABS:  Recent Labs Lab 05/02/13 2123 05/02/13 2200 05/03/13 0440 05/04/13 0540  HGB  --  11.0*  --  10.6*  WBC  --  15.1*  --  9.5  PLT  --  187  --  182  NA  --  139  --  143  K  --  3.9  --  3.5  CL  --  103  --  108  CO2  --  25  --  25  GLUCOSE  --  194*  --  248*  BUN  --  38*  --  43*  CREATININE  --  2.81*  --  2.10*  CALCIUM  --  8.1*  --  8.6  MG  --  1.8  --   --   PHOS  --  3.6  --   --   AST  --  18  --   --   ALT  --  10  --   --   ALKPHOS  --  74  --   --   BILITOT  --  0.3  --   --   PROT  --  5.7*  --   --   ALBUMIN  --  2.2*  --   --   APTT  --  29  --   --   INR  --  1.19  --   --   TROPONINI  --   --   --  <0.30  PROCALCITON  --  0.17  --   --   PHART 7.434  --  7.427  --   PCO2ART 35.1  --  38.1  --   PO2ART 168.0*  --  127.0*  --     Recent Labs Lab 05/04/13 2350 05/05/13 0412 05/05/13 0813 05/05/13 1259 05/05/13 1456  GLUCAP 242* 282* 277* 280* 197*   CXR:  NACPD  ASSESSMENT / PLAN:  PULMONARY A:  Acute respiratory failure due to AMS P:   Cont vent support Vent bundle Daily SBT as indicated  CARDIOVASCULAR A: Transient hypotension, resolved H/O CHF - compensated Chronic hypertension P:  Monitor In setting of acute CVAs, will allow BP to run high  RENAL A:  Acute renal failure, unclear etiology, nonoliguric P:   COnt IVFs Monitor chemistries Correct electrolytes as indicated  GASTROINTESTINAL A:  No active issues. P:   Begin TFs Cont SUP  HEMATOLOGIC A:  Mild anemia. P:  Monitor CBC SCDs for DVT Px  INFECTIOUS A:  No overt infections noted P:   Monitor off abx  ENDOCRINE  A:  DM2. Hyperglycemia. 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  CRITICAL CARE:  The patient is critically ill with multiple organ systems failure and requires  high complexity decision making for assessment and support, frequent evaluation and titration of therapies, application of advanced monitoring technologies and extensive interpretation of multiple databases. Critical Care Time devoted to patient care services described in this note is 40 minutes.   Discussed in detail with Dr Josie Dixon, MD Pulmonary and Critical Care Medicine [facility] Pager: [phone]  05/05/2013, 6:17 PM

## 2013-05-05 NOTE — Progress Notes (Signed)
Nursing 1400 Dr. Amada Jupiter to bedside, order to give Propofol 25mg  bolus, bolus given per MD.  Levophed gtt increased per MD prior to giving the bolus.  MD rechecked on patient later, additional Propofol 25mg  bolus given per MD order.  Will continue to assess. Insulin gtt started per MD order.  Glucostabilizer programmed per MD specific order.  Steroids started per MD order.

## 2013-05-05 NOTE — Progress Notes (Signed)
Nursing 1800 Propofol 25mg  bolus given and repeated in 5 minutes per Dr. Amada Jupiter.  BP stable throughout.

## 2013-05-05 NOTE — Procedures (Addendum)
History: 57 year old female with status epilepticus in the setting of recent strokes  This continuous EEG report covers from 05/04/13 8:06 PM until 9:07 AM 05/05/13  Background: The beginning the recording is predominated by periodic discharges as prominent over the right hemisphere consisting of sharp and slow waves with some superimposed fast activity (PLEDs plus). There is irregular slow activity as well. This pattern is interrupted with focal right hemispheric seizures recurrently throughout the recording.  EEG Abnormalities: 1) recurrent electrographic seizures in the right hemisphere PLEDs plus activity in between seizures  Clinical Interpretation: This EEG is most consistent with partial status epilepticus. There may be some mild improvement towards the end of the recording.  [name], MD [facility] [phone]  If 7pm- 7am, please page neurology on call at [phone].

## 2013-05-06 ENCOUNTER — Inpatient Hospital Stay (HOSPITAL_COMMUNITY): Payer: Medicaid Other

## 2013-05-06 ENCOUNTER — Encounter (HOSPITAL_COMMUNITY): Payer: Self-pay | Admitting: Radiology

## 2013-05-06 LAB — CBC
HCT: 34.1 % — ABNORMAL LOW (ref 36.0–46.0)
MCH: 30.1 pg (ref 26.0–34.0)
MCV: 93.4 fL (ref 78.0–100.0)
Platelets: 333 10*3/uL (ref 150–400)
RBC: 3.65 MIL/uL — ABNORMAL LOW (ref 3.87–5.11)

## 2013-05-06 LAB — BASIC METABOLIC PANEL
CO2: 27 mEq/L (ref 19–32)
Calcium: 8.5 mg/dL (ref 8.4–10.5)
Chloride: 111 mEq/L (ref 96–112)
Creatinine, Ser: 1.93 mg/dL — ABNORMAL HIGH (ref 0.50–1.10)
Glucose, Bld: 146 mg/dL — ABNORMAL HIGH (ref 70–99)
Sodium: 146 mEq/L — ABNORMAL HIGH (ref 135–145)

## 2013-05-06 LAB — VDRL, CSF: VDRL Quant, CSF: NONREACTIVE

## 2013-05-06 LAB — GLUCOSE, CAPILLARY
Glucose-Capillary: 144 mg/dL — ABNORMAL HIGH (ref 70–99)
Glucose-Capillary: 146 mg/dL — ABNORMAL HIGH (ref 70–99)
Glucose-Capillary: 159 mg/dL — ABNORMAL HIGH (ref 70–99)
Glucose-Capillary: 167 mg/dL — ABNORMAL HIGH (ref 70–99)
Glucose-Capillary: 172 mg/dL — ABNORMAL HIGH (ref 70–99)
Glucose-Capillary: 178 mg/dL — ABNORMAL HIGH (ref 70–99)
Glucose-Capillary: 194 mg/dL — ABNORMAL HIGH (ref 70–99)

## 2013-05-06 LAB — CULTURE, BLOOD (SINGLE)

## 2013-05-06 LAB — ANCA SCREEN W REFLEX TITER
Atypical p-ANCA Screen: NEGATIVE
c-ANCA Screen: NEGATIVE
p-ANCA Screen: NEGATIVE

## 2013-05-06 MED ORDER — PENTOBARBITAL LOAD VIA INFUSION
6.0000 mg/kg | Freq: Once | INTRAVENOUS | Status: AC
  Administered 2013-05-06: 699.6 mg via INTRAVENOUS

## 2013-05-06 MED ORDER — PENTOBARBITAL LOAD VIA INFUSION: 3.0000 mg/kg | Freq: Once | INTRAVENOUS | Status: DC

## 2013-05-06 MED ORDER — PRO-STAT SUGAR FREE PO LIQD
30.0000 mL | Freq: Every day | ORAL | Status: DC
  Administered 2013-05-06 – 2013-05-09 (×7): 30 mL
  Filled 2013-05-06 (×19): qty 30

## 2013-05-06 MED ORDER — VITAL AF 1.2 CAL PO LIQD
1000.0000 mL | ORAL | Status: DC
  Administered 2013-05-06 – 2013-05-07 (×2): 1000 mL
  Filled 2013-05-06 (×5): qty 1000

## 2013-05-06 MED ORDER — IOHEXOL 300 MG/ML  SOLN
150.0000 mL | Freq: Once | INTRAMUSCULAR | Status: AC | PRN
  Administered 2013-05-06: 75 mL via INTRAVENOUS

## 2013-05-06 MED ORDER — ARTIFICIAL TEARS OP OINT
TOPICAL_OINTMENT | OPHTHALMIC | Status: DC | PRN
  Administered 2013-05-06 (×2): 1 via OPHTHALMIC
  Filled 2013-05-06: qty 3.5

## 2013-05-06 MED ORDER — FREE WATER
100.0000 mL | Freq: Three times a day (TID) | Status: DC
  Administered 2013-05-06 – 2013-05-10 (×10): 100 mL

## 2013-05-06 MED ORDER — SODIUM CHLORIDE 0.9 % IV SOLN: INTRAVENOUS | Status: AC

## 2013-05-06 MED ORDER — ENOXAPARIN SODIUM 40 MG/0.4ML ~~LOC~~ SOLN
40.0000 mg | SUBCUTANEOUS | Status: DC
  Administered 2013-05-06 – 2013-05-25 (×20): 40 mg via SUBCUTANEOUS
  Filled 2013-05-06 (×21): qty 0.4

## 2013-05-06 NOTE — Procedures (Signed)
S/P 4 vessel cerebral arteriogram. RT CFA approach. Findings.Marland Kitchen 1.smooth focal areas of narrowing of prox PCAs and distal pericallosal  Subcortical branches.,suspicious of vasculitis.?autoimmune,infectious inflammatory

## 2013-05-06 NOTE — Progress Notes (Signed)
eLink Physician-Brief Progress Note Patient Name: Brenda Pope DOB: 1956-05-24 MRN: [id number]  Date of Service  05/06/2013   HPI/Events of Note  Dry eyes   eICU Interventions  PRN order for lacrilube ointment   Intervention Category Minor Interventions: Routine modifications to care plan (e.g. PRN medications for pain, fever)  Momin Misko 05/06/2013, 12:20 AM

## 2013-05-06 NOTE — Progress Notes (Signed)
NUTRITION FOLLOW UP  Intervention:    Increase Vital AF 1.2 by 10 ml/h every 4 hours to goal rate of 45 ml/h with Prostat 30 ml 5 times daily to provide 1796 kcals (25 kcals/kg ideal weight), 156 gm protein, 876 ml free water daily.  Nutrition Dx:   Inadequate oral intake related to inability to eat as evidenced by NPO status; ongoing.  Goal:   Enteral nutrition to provide 60-70% of estimated calorie needs (22-25 kcals/kg ideal body weight) and 100% of estimated protein needs, based on ASPEN guidelines for permissive underfeeding in critically ill obese individuals. Progressing.  Monitor:   TF tolerance/adequacy, labs, weight trend, vent status.  Assessment:   Patient remains intubated on ventilator support.  MV: 7.3 Temp:Temp (24hrs), Avg:99.6 F (37.6 C), Min:98.4 F (36.9 C), Max:100.4 F (38 C)  Propofol has been discontinued. Will adjust TF to better meet estimated nutrition needs.  Height: Ht Readings from Last 1 Encounters:  05/02/13 6' (1.829 m)    Weight Status:   Wt Readings from Last 1 Encounters:  05/06/13 257 lb 0.9 oz (116.6 kg)  05/02/13  238 lb 1.6 oz (108 kg)   Re-estimated needs:  Kcal: 2075 Protein: 145 gm protein Fluid: 2-2.2 L   Skin: no issues  Diet Order: NPO  TF Order: Vital AF 1.2 at 10 ml/h providing  288 kcals, 18 gm protein, 195 ml free water daily.   Intake/Output Summary (Last 24 hours) at 05/06/13 1155 Last data filed at 05/06/13 1100  Gross per 24 hour  Intake 1767.83 ml  Output    910 ml  Net 857.83 ml    Last BM: 7/10   Labs:   Recent Labs Lab 05/02/13 2200 05/04/13 0540 05/06/13 0450  NA 139 143 146*  K 3.9 3.5 4.5  CL 103 108 111  CO2 25 25 27   BUN 38* 43* 55*  CREATININE 2.81* 2.10* 1.93*  CALCIUM 8.1* 8.6 8.5  MG 1.8  --   --   PHOS 3.6  --   --   GLUCOSE 194* 248* 146*    CBG (last 3)   Recent Labs  05/06/13 0607 05/06/13 0656 05/06/13 0758  GLUCAP 139* 149* 134*    Scheduled Meds: .  antiseptic oral rinse  1 application Mouth Rinse QID  . aspirin  325 mg Per Tube Daily  . chlorhexidine  15 mL Mouth/Throat BID  . Chlorhexidine Gluconate Cloth  6 each Topical Q0600  . enoxaparin (LOVENOX) injection  40 mg Subcutaneous Q24H  . free water  100 mL Per Tube Q8H  . lacosamide (VIMPAT) IV  200 mg Intravenous Q12H  . levETIRAcetam  1,000 mg Intravenous Q12H  . methylPREDNISolone (SOLU-MEDROL) injection  1,000 mg Intravenous Q24H  . mupirocin ointment  1 application Nasal BID  . pantoprazole sodium  40 mg Per Tube Q1200  . phenytoin (DILANTIN) IV  100 mg Intravenous Q8H    Continuous Infusions: . sodium chloride 15 mL/hr at 05/06/13 0600  . feeding supplement (VITAL AF 1.2 CAL) 1,000 mL (05/05/13 1600)  . insulin (NOVOLIN-R) infusion 3.5 Units/hr (05/06/13 1100)  . norepinephrine (LEVOPHED) Adult infusion 3 mcg/min (05/06/13 1100)  . PENTobarbital (NEMBUTAL) infusion 4 mg/kg/hr (05/06/13 1145)    Joaquin Courts, RD, LDN, CNSC Pager [phone] After Hours Pager [phone]

## 2013-05-06 NOTE — H&P (Signed)
Brenda Pope is an 57 y.o. female.   Chief Complaint: Hx recurrent strokes Status epilepticus CHF exacerbation; dyspnea Transferred to [facility] for intubation and management New strokes on 7/8/MRI Scheduled for cerebral arteriogram HPI: CVA; DM; HTN; CHF; Sz  Past Medical History  Diagnosis Date  . Stroke   . Diabetes mellitus without complication   . Hypertension   . CHF (congestive heart failure)     History reviewed. No pertinent past surgical history.  No family history on file. Social History:  has no tobacco, alcohol, and drug history on file.  Allergies:  Allergies  Allergen Reactions  . Penicillins Nausea And Vomiting    Medications Prior to Admission  Medication Sig Dispense Refill  . albuterol (PROVENTIL HFA;VENTOLIN HFA) 108 (90 BASE) MCG/ACT inhaler Inhale 2 puffs into the lungs every 6 (six) hours as needed for wheezing.      Marland Kitchen albuterol (PROVENTIL) (2.5 MG/3ML) 0.083% nebulizer solution Take 2.5 mg by nebulization every 6 (six) hours as needed for wheezing.      Marland Kitchen aspirin EC 81 MG tablet Take 162 mg by mouth 2 (two) times daily.      . Aspirin-Acetaminophen-Caffeine (EXCEDRIN MIGRAINE PO) Take 1 tablet by mouth daily as needed. For headache      . buPROPion (WELLBUTRIN) 75 MG tablet Take 75 mg by mouth daily.      . candesartan (ATACAND) 32 MG tablet Take 16 mg by mouth 2 (two) times daily.      . Choline Fenofibrate (TRILIPIX) 135 MG capsule Take 135 mg by mouth daily.      . clopidogrel (PLAVIX) 75 MG tablet Take 75 mg by mouth daily.      . fluticasone (FLONASE) 50 MCG/ACT nasal spray Place 2 sprays into the nose daily.      . furosemide (LASIX) 40 MG tablet Take 80 mg by mouth 2 (two) times daily.      . metoprolol succinate (TOPROL-XL) 100 MG 24 hr tablet Take 100 mg by mouth daily. Take with or immediately following a meal.      . omeprazole (PRILOSEC) 40 MG capsule Take 40 mg by mouth daily.      . promethazine (PHENERGAN) 25 MG tablet Take 25 mg by mouth  every 8 (eight) hours as needed for nausea.      Marland Kitchen SALINE NA Place 1 spray into the nose 2 (two) times daily as needed. For nasal congestion      . spironolactone (ALDACTONE) 25 MG tablet Take 25 mg by mouth daily.        Results for orders placed during the hospital encounter of 05/02/13 (from the past 48 hour(s))  GLUCOSE, CAPILLARY     Status: Abnormal   Collection Time    05/04/13 12:44 PM      Result Value Range   Glucose-Capillary 176 (*) 70 - 99 mg/dL  GLUCOSE, CAPILLARY     Status: Abnormal   Collection Time    05/04/13  3:38 PM      Result Value Range   Glucose-Capillary 218 (*) 70 - 99 mg/dL   Comment 1 Notify RN     Comment 2 Documented in Chart    GLUCOSE, CAPILLARY     Status: Abnormal   Collection Time    05/04/13  7:38 PM      Result Value Range   Glucose-Capillary 221 (*) 70 - 99 mg/dL   Comment 1 Notify RN     Comment 2 Documented in Chart  GLUCOSE, CAPILLARY     Status: Abnormal   Collection Time    05/04/13 11:50 PM      Result Value Range   Glucose-Capillary 242 (*) 70 - 99 mg/dL  GLUCOSE, CAPILLARY     Status: Abnormal   Collection Time    05/05/13  4:12 AM      Result Value Range   Glucose-Capillary 282 (*) 70 - 99 mg/dL   Comment 1 Notify RN    PHENYTOIN LEVEL, TOTAL     Status: None   Collection Time    05/05/13  4:45 AM      Result Value Range   Phenytoin Lvl 18.6  10.0 - 20.0 ug/mL  GLUCOSE, CAPILLARY     Status: Abnormal   Collection Time    05/05/13  8:13 AM      Result Value Range   Glucose-Capillary 277 (*) 70 - 99 mg/dL  SEDIMENTATION RATE     Status: Abnormal   Collection Time    05/05/13 12:00 PM      Result Value Range   Sed Rate 103 (*) 0 - 22 mm/hr  C-REACTIVE PROTEIN     Status: Abnormal   Collection Time    05/05/13 12:00 PM      Result Value Range   CRP 7.4 (*) <0.60 mg/dL  PROTEIN AND GLUCOSE, CSF     Status: Abnormal   Collection Time    05/05/13 12:43 PM      Result Value Range   Glucose, CSF 174 (*) 43 - 76 mg/dL    Total  Protein, CSF 86 (*) 15 - 45 mg/dL  CSF CELL COUNT WITH DIFFERENTIAL     Status: Abnormal   Collection Time    05/05/13 12:43 PM      Result Value Range   Tube # 3     Color, CSF COLORLESS  COLORLESS   Appearance, CSF CLEAR  CLEAR   Supernatant NOT INDICATED     RBC Count, CSF 128 (*) 0 /cu mm   WBC, CSF 2  0 - 5 /cu mm   Segmented Neutrophils-CSF RARE  0 - 6 %   Lymphs, CSF FEW  40 - 80 %   Monocyte-Macrophage-Spinal Fluid RARE  15 - 45 %   Eosinophils, CSF NONE SEEN  0 - 1 %  CSF CULTURE     Status: None   Collection Time    05/05/13 12:43 PM      Result Value Range   Specimen Description CSF     Special Requests 1.0ML FLUID     Gram Stain       Value: CYTOSPIN SLIDE WBC PRESENT,BOTH PMN AND MONONUCLEAR     NO ORGANISMS SEEN   Culture NO GROWTH 1 DAY     Report Status PENDING    GRAM STAIN     Status: None   Collection Time    05/05/13 12:43 PM      Result Value Range   Specimen Description CSF     Special Requests 1.0ML FLUID     Gram Stain       Value: CYTOSPIN SLIDE:     WBC PRESENT,BOTH PMN AND MONONUCLEAR     NO ORGANISMS SEEN   Report Status 05/05/2013 FINAL    GLUCOSE, CAPILLARY     Status: Abnormal   Collection Time    05/05/13 12:59 PM      Result Value Range   Glucose-Capillary 280 (*) 70 - 99 mg/dL  CULTURE, BLOOD (  ROUTINE X 2)     Status: None   Collection Time    05/05/13  2:50 PM      Result Value Range   Specimen Description BLOOD LEFT FINGER     Special Requests BOTTLES DRAWN AEROBIC ONLY 6CC     Culture  Setup Time 05/05/2013 21:09     Culture       Value:        BLOOD CULTURE RECEIVED NO GROWTH TO DATE CULTURE WILL BE HELD FOR 5 DAYS BEFORE ISSUING A FINAL NEGATIVE REPORT   Report Status PENDING    GLUCOSE, CAPILLARY     Status: Abnormal   Collection Time    05/05/13  2:56 PM      Result Value Range   Glucose-Capillary 197 (*) 70 - 99 mg/dL  CULTURE, BLOOD (ROUTINE X 2)     Status: None   Collection Time    05/05/13  3:03 PM       Result Value Range   Specimen Description BLOOD LEFT ARM     Special Requests BOTTLES DRAWN AEROBIC AND ANAEROBIC 10CC     Culture  Setup Time 05/05/2013 21:09     Culture       Value:        BLOOD CULTURE RECEIVED NO GROWTH TO DATE CULTURE WILL BE HELD FOR 5 DAYS BEFORE ISSUING A FINAL NEGATIVE REPORT   Report Status PENDING    GLUCOSE, CAPILLARY     Status: Abnormal   Collection Time    05/05/13  3:49 PM      Result Value Range   Glucose-Capillary 243 (*) 70 - 99 mg/dL   Comment 1 Notify RN     Comment 2 Documented in Chart    GLUCOSE, CAPILLARY     Status: Abnormal   Collection Time    05/05/13  4:51 PM      Result Value Range   Glucose-Capillary 195 (*) 70 - 99 mg/dL  GLUCOSE, CAPILLARY     Status: Abnormal   Collection Time    05/05/13  6:03 PM      Result Value Range   Glucose-Capillary 178 (*) 70 - 99 mg/dL  GLUCOSE, CAPILLARY     Status: Abnormal   Collection Time    05/05/13  6:51 PM      Result Value Range   Glucose-Capillary 177 (*) 70 - 99 mg/dL  GLUCOSE, CAPILLARY     Status: Abnormal   Collection Time    05/05/13  7:55 PM      Result Value Range   Glucose-Capillary 170 (*) 70 - 99 mg/dL   Comment 1 Notify RN     Comment 2 Documented in Chart    GLUCOSE, CAPILLARY     Status: Abnormal   Collection Time    05/05/13  9:08 PM      Result Value Range   Glucose-Capillary 201 (*) 70 - 99 mg/dL  GLUCOSE, CAPILLARY     Status: Abnormal   Collection Time    05/05/13 10:08 PM      Result Value Range   Glucose-Capillary 206 (*) 70 - 99 mg/dL  GLUCOSE, CAPILLARY     Status: Abnormal   Collection Time    05/05/13 11:09 PM      Result Value Range   Glucose-Capillary 178 (*) 70 - 99 mg/dL  GLUCOSE, CAPILLARY     Status: Abnormal   Collection Time    05/06/13 12:09 AM      Result Value  Range   Glucose-Capillary 167 (*) 70 - 99 mg/dL  GLUCOSE, CAPILLARY     Status: Abnormal   Collection Time    05/06/13  1:10 AM      Result Value Range   Glucose-Capillary 137  (*) 70 - 99 mg/dL  GLUCOSE, CAPILLARY     Status: Abnormal   Collection Time    05/06/13  2:08 AM      Result Value Range   Glucose-Capillary 146 (*) 70 - 99 mg/dL  GLUCOSE, CAPILLARY     Status: Abnormal   Collection Time    05/06/13  3:10 AM      Result Value Range   Glucose-Capillary 144 (*) 70 - 99 mg/dL  GLUCOSE, CAPILLARY     Status: Abnormal   Collection Time    05/06/13  3:40 AM      Result Value Range   Glucose-Capillary 132 (*) 70 - 99 mg/dL   Comment 1 Documented in Chart     Comment 2 Notify RN    BASIC METABOLIC PANEL     Status: Abnormal   Collection Time    05/06/13  4:50 AM      Result Value Range   Sodium 146 (*) 135 - 145 mEq/L   Potassium 4.5  3.5 - 5.1 mEq/L   Comment: DELTA CHECK NOTED   Chloride 111  96 - 112 mEq/L   CO2 27  19 - 32 mEq/L   Glucose, Bld 146 (*) 70 - 99 mg/dL   BUN 55 (*) 6 - 23 mg/dL   Creatinine, Ser 5.40 (*) 0.50 - 1.10 mg/dL   Calcium 8.5  8.4 - 98.1 mg/dL   GFR calc non Af Amer 28 (*) >90 mL/min   GFR calc Af Amer 32 (*) >90 mL/min   Comment:            The eGFR has been calculated     using the CKD EPI equation.     This calculation has not been     validated in all clinical     situations.     eGFR's persistently     <90 mL/min signify     possible Chronic Kidney Disease.  CBC     Status: Abnormal   Collection Time    05/06/13  4:50 AM      Result Value Range   WBC 11.6 (*) 4.0 - 10.5 K/uL   RBC 3.65 (*) 3.87 - 5.11 MIL/uL   Hemoglobin 11.0 (*) 12.0 - 15.0 g/dL   HCT 19.1 (*) 47.8 - 29.5 %   MCV 93.4  78.0 - 100.0 fL   MCH 30.1  26.0 - 34.0 pg   MCHC 32.3  30.0 - 36.0 g/dL   RDW 62.1  30.8 - 65.7 %   Platelets 333  150 - 400 K/uL   Comment: DELTA CHECK NOTED     REPEATED TO VERIFY  GLUCOSE, CAPILLARY     Status: Abnormal   Collection Time    05/06/13  5:08 AM      Result Value Range   Glucose-Capillary 135 (*) 70 - 99 mg/dL  GLUCOSE, CAPILLARY     Status: Abnormal   Collection Time    05/06/13  6:07 AM       Result Value Range   Glucose-Capillary 139 (*) 70 - 99 mg/dL  GLUCOSE, CAPILLARY     Status: Abnormal   Collection Time    05/06/13  6:56 AM      Result Value  Range   Glucose-Capillary 149 (*) 70 - 99 mg/dL  GLUCOSE, CAPILLARY     Status: Abnormal   Collection Time    05/06/13  7:58 AM      Result Value Range   Glucose-Capillary 134 (*) 70 - 99 mg/dL   Dg Chest Port 1 View  05/06/2013   *RADIOLOGY REPORT*  Clinical Data: Fall of respiratory failure.  PORTABLE CHEST - 1 VIEW  Comparison: Chest x-ray from 2 days prior.  Findings: Endotracheal tube ends in the mid thoracic trachea. Gastric suction tube is coiled in the stomach.  Similar positioning of right IJ central venous catheter.  Worsening lower lung aeration, particularly on the left where the diaphragm is no longer distinct.  Probable cardiomegaly, although rightward rotation distorts the mediastinum.  No evident pneumothorax.  IMPRESSION:  1.  Stable positioning of support apparatus. 2.  Worsening aeration at the bases, particularly the left. Appearance may be related to under aeration or developing infiltrate.   Original Report Authenticated By: Tiburcio Pea   Ct Portable Head W/o Cm  05/05/2013   *RADIOLOGY REPORT*  Clinical Data: Status epilepticus.  Stroke.  CT HEAD WITHOUT CONTRAST  Technique:  Contiguous axial images were obtained from the base of the skull through the vertex without contrast.  Comparison: MRI 05/03/2013  Findings: Portable CT scanner was utilized at the bedside.  Initial images were degraded by EEG leads which were removed.  The study was repeated.  Patchy hypodensity in the right occipital parietal lobes compatible with acute infarct.  There is also acute infarct in the right posterior temporal lobe.  These correspond to areas of restricted diffusion on MRI.  Ventricle size is normal.  No acute hemorrhage.  Chronic microvascular ischemic changes in the white matter.  Calvarium is intact.  IMPRESSION: Multiple areas  of hypodensity in the right MCA and PCA territory compatible with acute infarct without hemorrhage.  I reviewed the images with Dr. Amada Jupiter   Original Report Authenticated By: Janeece Riggers, M.D.    Review of Systems  Constitutional: Negative for fever.  Respiratory:       On vent  Cardiovascular:       On vent  Neurological:       No movement    Blood pressure 133/56, pulse 54, temperature 99.4 F (37.4 C), temperature source Oral, resp. rate 14, height 6' (1.829 m), weight 257 lb 0.9 oz (116.6 kg), SpO2 100.00%. Physical Exam  Cardiovascular: Normal rate, regular rhythm, normal heart sounds and intact distal pulses.   No murmur heard. Respiratory: Effort normal. She has wheezes.  GI: Soft. Bowel sounds are normal. There is no tenderness.  Neurological:  vent     Assessment/Plan Recurrent strokes New CVA on MRI On vent Status epilepticus Scheduled for cerebral arteriogram Will obtain consent from husband prior to procedure  Rudra Hobbins A 05/06/2013, 10:49 AM

## 2013-05-06 NOTE — Progress Notes (Signed)
Downloaded yesterday's study and restarted continuous EEG

## 2013-05-06 NOTE — Progress Notes (Signed)
Nursing 1400 Insulin drip stopped prior to transfer to radiology for angiogram.  Glucostabilzer program on hold, will resume once patient back to the room.  Dr. Amada Jupiter made aware that patient in IR, will go ahead and obtain CT scan while in radiology.  Per MD, will remove EEG electrodes for angiogram, able to obtain CT with EEG probes removed as well.  Will notify EEG personnel once patient back to room to reapply the electrodes and resume continuous EEG.

## 2013-05-06 NOTE — Progress Notes (Signed)
Nursing 1815 Dr. Corliss Skains returned page and notified of bleeding.  Will place 10lb sandbag to site x4hours and leave patient flat at this time.  Will hold off on giving Lovenox dose until later this evening per MD.

## 2013-05-06 NOTE — Progress Notes (Signed)
Nursing 1615 Dr. Corliss Skains made aware that patient has a scheduled dose of Lovenox at 1800, ok to give dose at that time.  MD also made aware of patient's CHF and RN questions IVF order.  Will start NS at 50cc/hr x4hours, then [facility] as before procedure.

## 2013-05-06 NOTE — Progress Notes (Signed)
Nursing 1155 RN contacted by Diabetes Coordinator and informed that Dextrose solution should be added now that the patient's tube feeds are on hold for a procedure.  Dr. Sung Amabile made aware of this information and the patient's latest CBGs, will continue with the insulin drip and q1h CBGs, and will notify for any hypoglycemia.  Will not start Dextrose solution at this time.

## 2013-05-06 NOTE — Progress Notes (Addendum)
Subjective: Burst suppression overnight,   Exam: Filed Vitals:   05/06/13 0809  BP: 151/57  Pulse:   Temp:   Resp:    Gen: In bed, intubated Exam severely confounded by burst suppression. CN: Pupils minimally reactive bilaterally, R slightly larger than left.    Impression: 57 yo F with recurrent strokes and status epieltpcius.  Her multivascular infarcts without clear proximal stenosis is unusual. With normal CSF the diagnosis of vasculitis is less likely, but given the confusing picture, I would favor continuing steroids at this time for possible vasculitis. It is certainly possible still as well that this represents recurrent embolic disease.    Recommendations: 1)Transesophageal echo, discssued with providers, scheduled for Monday.  2) Given lack of exam and asymetric pupils will repeat CT today.  3) Continue solumedrol 1gm daily 4) Continue AEDs at current doses 5) Will plan on burst suppression for 48 hours, then stopping pentobarbital drip.  6) Will discuss with radiology possibility of angiogram.   This patient is critically ill and at significant risk of neurological worsening, death and care requires constant monitoring of vital signs, hemodynamics,respiratory and cardiac monitoring, neurological assessment, discussion with family, other specialists and medical decision making of high complexity. I spent 45 minutes of neurocritical care time  in the care of  this patient.  [name], MD [facility] [phone]  If 7pm- 7am, please page neurology on call at [phone].  05/06/2013  2:20 PM  n call at [phone].

## 2013-05-06 NOTE — Procedures (Signed)
History: 57 year old female with status epilepticus in the setting of recent strokes   This continuous EEG report covers from 1:50pm 05/05/13 until  05/06/13 8:45 AM  Background: The beginning the recording consists of periods of relative suppression interrupted by episodes of activity including periodic discharges in the right hemisphere. These would sometimes organize and then have periodic discharges with superimposed fast activity (PLEDs plus). Over the course of the recording, the pleds plus pattern became less frequent, the periods of suppression became longer   EEG Abnormalities: 1) initially consistent with partial status epilepticus, replaced by burst suppression pattern. 2) the bursts do occasionally continue to have sharp activity in the right hemisphere.  Clinical Interpretation: This EEG is consistent with partial status epilepticus that was subsequently replaced with a burst suppression pattern throughout the remainder of the recording.    [name], MD  [facility]  [phone]  If 7pm- 7am, please page neurology on call at [phone].

## 2013-05-06 NOTE — Progress Notes (Signed)
PULMONARY  / CRITICAL CARE MEDICINE  Name: Brenda Pope MRN: [id number] DOB: 01-30-1956    ADMISSION DATE:  05/02/2013 CONSULTATION DATE:  7/7/104  REFERRING MD :  Transfer from [facility] PRIMARY SERVICE:  PCCM  CHIEF COMPLAINT:  Status epilepticus  BRIEF PATIENT DESCRIPTION: 57 yo with past medical history of CVAs / TIAs and diastolic heart failure admitted to [facility] on 7/5 with acute dyspnea and suspected CHF exacerbation.  Later on the day of admission developed developed neurological findings consistent with acute CVA.  On 7/6 intubated for airway protection during suspected seizure activity.  Hypotensive post intubation requiring vasopressors. MRI confirmed small acute infarcts of the right parietal lobe. On 7/7 transferred to [facility] for further management.  SIGNIFICANT EVENTS / STUDIES:  7/5  Admitted to [place] [facility] with CHF exacerbation; stroke symptoms 7/6  Suspected seizure activity, intubated for airway protection; hypotensive post intubation requiring vasopressors 7/6  Brain MRI:  Multiple small acute infarcts R parietal lobe, possible small infarcts left basal ganglia and central pons 7/6  TTE:  No source of CVA, EF 55-60% 7/6  Carotid Doppler:  No evidence of hemodynamically significant stenosis 7/7  Transferred to [facility], PCCM serivce 7/7 Neuro consult: "Concern patient is on SE in the context of recent cortical stroke." 7/8 MRI brain: Confluent and scattered right MCA and right PCA acute infarcts, plus scattered small left hemisphere and occasional posterior fossa acute infarcts. The vast majority of these infarcts are newly seen since 05/01/2013.  This appearance suggests embolic phenomena which severely affected more proximal/medium-sized vessels of the right MCA and PCA territories. Cytotoxic edema without significant mass effect at this time. No associated hemorrhage 7/8 EEG: severe diffuse encephalopathic process with asymmetrical involvement. Findings  reported from the left hemisphere were nonspecific. Findings record from the right hemisphere were consistent with acute large area of infarction. No frank epileptiform activity was recorded. 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  7/10 LP performed by Neuro:  7/10 Burst suppression with propofol. Norepi initiated to prevent propofol induced hypotension. Consideration of primary CNS vasculitis. Serologies ordered>> CRP, ESR markedly elevated. High dose methylpred ordered. Insulin gtt ordered while on high dose steriods 7/10 propofol ineffective at burst suppression. pentobarbitol initiated 7/11 CT head:  7/11 angiogram eval for vasculitis:   LINES / TUBES: ETT 7/6 >>  R IJ CVL 7/6 >>  L radial A-line 7/10 >>    CULTURES: MRSA PCR 7/07 >> POS   ANTIBIOTICS:   SUBJ: Remains comatose.   VITAL SIGNS: Temp:  [99.1 F (37.3 C)-100.4 F (38 C)] 99.4 F (37.4 C) (07/11 1200) Pulse Rate:  [52-71] 52 (07/11 1300) Resp:  [14-18] 14 (07/11 1300) BP: (117-180)/(48-63) 121/50 mmHg (07/11 1300) SpO2:  [98 %-100 %] 100 % (07/11 1300) Arterial Line BP: (116-197)/(41-67) 128/53 mmHg (07/11 1300) FiO2 (%):  [30 %-40.3 %] 30.1 % (07/11 1300) Weight:  [116.6 kg (257 lb 0.9 oz)] 116.6 kg (257 lb 0.9 oz) (07/11 0500)  HEMODYNAMICS: CVP:  [0 mmHg-11 mmHg] 9 mmHg VENTILATOR SETTINGS: Vent Mode:  [-] PRVC FiO2 (%):  [30 %-40.3 %] 30.1 % Set Rate:  [14 bmp] 14 bmp Vt Set:  [500 mL] 500 mL PEEP:  [5 cmH20] 5 cmH20 Plateau Pressure:  [16 cmH20-17 cmH20] 16 cmH20  INTAKE / OUTPUT: Intake/Output     07/10 0701 - 07/11 0700  07/11 0701 - 07/12 0700   I.V. (mL/kg) 1264 (10.8) 494.7 (4.2)   Other 40 100   NG/GT 280 20   IV Piggyback 368 155   Total Intake(mL/kg) 1952 (16.7) 769.7 (6.6)   Urine (mL/kg/hr) 920 (0.3)  238 (0.3)   Total Output 920 238   Net +1032 +531.7          PHYSICAL EXAMINATION: General: comatose, NAD Neuro: no spont movement, no w/d from pain HEENT: PERRL, EOMI Cardiovascular:  RRR s M Lungs: clear Abdomen:  Soft, nontender, bowel sounds diminished Musculoskeletal: warm, no edema  LABS:  Recent Labs Lab 05/02/13 2123  05/02/13 2200 05/03/13 0440 05/04/13 0540 05/06/13 0450  HGB  --   --  11.0*  --  10.6* 11.0*  WBC  --   --  15.1*  --  9.5 11.6*  PLT  --   --  187  --  182 333  NA  --   --  139  --  143 146*  K  --   < > 3.9  --  3.5 4.5  CL  --   --  103  --  108 111  CO2  --   --  25  --  25 27  GLUCOSE  --   --  194*  --  248* 146*  BUN  --   --  38*  --  43* 55*  CREATININE  --   --  2.81*  --  2.10* 1.93*  CALCIUM  --   --  8.1*  --  8.6 8.5  MG  --   --  1.8  --   --   --   PHOS  --   --  3.6  --   --   --   AST  --   --  18  --   --   --   ALT  --   --  10  --   --   --   ALKPHOS  --   --  74  --   --   --   BILITOT  --   --  0.3  --   --   --   PROT  --   --  5.7*  --   --   --   ALBUMIN  --   --  2.2*  --   --   --   APTT  --   --  29  --   --   --   INR  --   --  1.19  --   --   --   TROPONINI  --   --   --   --  <0.30  --   PROCALCITON  --   --  0.17  --   --   --   PHART 7.434  --   --  7.427  --   --   PCO2ART 35.1  --   --  38.1  --   --   PO2ART 168.0*  --   --  127.0*  --   --   < > = values in this interval not displayed.  Recent Labs Lab 05/06/13 0340 05/06/13 0508 05/06/13 0607 05/06/13 0656 05/06/13 0758  GLUCAP 132* 135* 139* 149* 134*   CXR: bibasilar atx  ASSESSMENT / PLAN:  PULMONARY A:  Acute respiratory failure due to AMS P:   Cont vent support Vent bundle Daily SBT as indicated  CARDIOVASCULAR A:  propofol/pentobarb induced hypotension H/O CHF - compensated Chronic hypertension P:  Cont norepi to maintain MAP > 70 and SBP > 120  RENAL A:  Acute renal failure, unclear etiology, improving P:   COnt  IVFs Monitor chemistries Correct electrolytes as indicated  GASTROINTESTINAL A:  No active issues. P:   Cont TFs Cont SUP  HEMATOLOGIC A:  Mild anemia. P:  Monitor CBC SCDs for DVT Px  INFECTIOUS A:  No overt infections noted P:   Monitor off abx  ENDOCRINE  A:  DM2. Hyperglycemia. 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  CRITICAL CARE:  The patient is critically ill with multiple organ systems failure and requires high complexity decision making for assessment and support, frequent evaluation and titration of therapies, application of advanced monitoring technologies and extensive interpretation of multiple databases. Critical Care Time devoted to patient care services described in this note is 30 minutes.   Discussed in detail with Dr Josie Dixon, MD Pulmonary and Critical Care Medicine [facility] Pager: [phone]  05/06/2013, 2:08 PM

## 2013-05-06 NOTE — Progress Notes (Signed)
Nursing [address], [place] for neurology made aware that patient's pupils are nonreactive, RN unable to detect reaction, but pupils the same size as previous exams.  Will make Dr. Amada Jupiter aware, no further orders at this time.

## 2013-05-06 NOTE — Progress Notes (Signed)
Nursing 1730 RN to bedside to check patient's groin site and pulses.  On assessment, RN found blood in patient's groin fold and small amount on bed pad, (one spot that appeared to be fresh).  RN held pressure to site, and another RN to bedside to assist. Pulses intact.  Upon removing groin dressing which was clean, groin site not bleeding.  Brenda Pope 0.  No hematoma felt by 2 RNs.  Unsure if blood from procedure or new oozing.  Groin cleaned and gauze pressure dressing applied to the site.  Dr. Corliss Skains paged to make aware and RN will continue to assess.

## 2013-05-06 NOTE — Progress Notes (Signed)
Inpatient Diabetes Program Recommendations  AACE/ADA: New Consensus Statement on Inpatient Glycemic Control (2013)  Target Ranges:  Prepandial:   less than 140 mg/dL      Peak postprandial:   less than 180 mg/dL (1-2 hours)      Critically ill patients:  140 - 180 mg/dL   Pt on IV Solumedrol on IV insulin drip in preparation for procedure.  Vital tube feed is not being given at this time. Please add some dextrose to IV fluids as appropriate. Spoke with RN taking care of pt and requested her to get an order for some source of dextrose while on IV insulin drip.  Thank you, Lenor Coffin, RN, CNS, Diabetes Coordinator [phone])

## 2013-05-07 ENCOUNTER — Inpatient Hospital Stay (HOSPITAL_COMMUNITY): Payer: Medicaid Other

## 2013-05-07 LAB — GLUCOSE, CAPILLARY
Glucose-Capillary: 172 mg/dL — ABNORMAL HIGH (ref 70–99)
Glucose-Capillary: 168 mg/dL — ABNORMAL HIGH (ref 70–99)
Glucose-Capillary: 167 mg/dL — ABNORMAL HIGH (ref 70–99)
Glucose-Capillary: 152 mg/dL — ABNORMAL HIGH (ref 70–99)
Glucose-Capillary: 158 mg/dL — ABNORMAL HIGH (ref 70–99)
Glucose-Capillary: 139 mg/dL — ABNORMAL HIGH (ref 70–99)
Glucose-Capillary: 125 mg/dL — ABNORMAL HIGH (ref 70–99)
Glucose-Capillary: 176 mg/dL — ABNORMAL HIGH (ref 70–99)
Glucose-Capillary: 212 mg/dL — ABNORMAL HIGH (ref 70–99)
Glucose-Capillary: 215 mg/dL — ABNORMAL HIGH (ref 70–99)
Glucose-Capillary: 199 mg/dL — ABNORMAL HIGH (ref 70–99)

## 2013-05-07 LAB — CBC
HCT: 34.7 % — ABNORMAL LOW (ref 36.0–46.0)
MCV: 92.3 fL (ref 78.0–100.0)
RBC: 3.76 MIL/uL — ABNORMAL LOW (ref 3.87–5.11)
WBC: 8.9 10*3/uL (ref 4.0–10.5)

## 2013-05-07 LAB — BASIC METABOLIC PANEL
CO2: 27 mEq/L (ref 19–32)
Chloride: 109 mEq/L (ref 96–112)
Potassium: 4.1 mEq/L (ref 3.5–5.1)
Sodium: 145 mEq/L (ref 135–145)

## 2013-05-07 LAB — RHEUMATOID FACTOR: Rhuematoid fact SerPl-aCnc: <10 IU/mL (ref <=14)

## 2013-05-07 MED ORDER — METOCLOPRAMIDE HCL 5 MG/ML IJ SOLN
10.0000 mg | Freq: Three times a day (TID) | INTRAMUSCULAR | Status: DC
  Administered 2013-05-07 – 2013-05-11 (×14): 10 mg via INTRAVENOUS
  Filled 2013-05-07 (×17): qty 2

## 2013-05-07 NOTE — Progress Notes (Addendum)
PULMONARY  / CRITICAL CARE MEDICINE  Name: Brenda Pope MRN: [id number] DOB: 07/26/1956    ADMISSION DATE:  05/02/2013 CONSULTATION DATE:  7/7/104  REFERRING MD :  Transfer from [facility] PRIMARY SERVICE:  PCCM  CHIEF COMPLAINT:  Status epilepticus  BRIEF PATIENT DESCRIPTION: 57 yo with past medical history of CVAs / TIAs and diastolic heart failure admitted to [facility] on 7/5 with acute dyspnea and suspected CHF exacerbation.  Later on the day of admission developed developed neurological findings consistent with acute CVA.  On 7/6 intubated for airway protection during suspected seizure activity.  Hypotensive post intubation requiring vasopressors. MRI confirmed small acute infarcts of the right parietal lobe. On 7/7 transferred to [facility] for further management.  SIGNIFICANT EVENTS / STUDIES:  7/5  Admitted to [place] [facility] with CHF exacerbation; stroke symptoms 7/6  Suspected seizure activity, intubated for airway protection; hypotensive post intubation requiring vasopressors 7/6  Brain MRI:  Multiple small acute infarcts R parietal lobe, possible small infarcts left basal ganglia and central pons 7/6  TTE:  No source of CVA, EF 55-60% 7/6  Carotid Doppler:  No evidence of hemodynamically significant stenosis 7/7  Transferred to [facility], PCCM serivce 7/7 Neuro consult: "Concern patient is on SE in the context of recent cortical stroke." 7/8 MRI brain: Confluent and scattered right MCA and right PCA acute infarcts, plus scattered small left hemisphere and occasional posterior fossa acute infarcts. The vast majority of these infarcts are newly seen since 05/01/2013.  This appearance suggests embolic phenomena which severely affected more proximal/medium-sized vessels of the right MCA and PCA territories. Cytotoxic edema without significant mass effect at this time. No associated hemorrhage 7/8 EEG: severe diffuse encephalopathic process with asymmetrical involvement. Findings  reported from the left hemisphere were nonspecific. Findings record from the right hemisphere were consistent with acute large area of infarction. No frank epileptiform activity was recorded. 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  7/10 LP performed by Neuro:  7/10 Burst suppression with propofol. Norepi initiated to prevent propofol induced hypotension. Consideration of primary CNS vasculitis. Serologies ordered>> CRP, ESR markedly elevated. High dose methylpred ordered. Insulin gtt ordered while on high dose steriods 7/10 propofol ineffective at burst suppression. pentobarbitol initiated 7/11 CT head:  7/11 angiogram eval for vasculitis:   LINES / TUBES: ETT 7/6 >>  R IJ CVL 7/6 >>  L radial A-line 7/10 >> 05/07/13   CULTURES: MRSA PCR 7/07 >> POS Results for orders placed during the hospital encounter of 05/02/13  MRSA PCR SCREENING     Status: Abnormal   Collection Time    05/02/13 10:25 PM      Result Value Range Status   MRSA by PCR POSITIVE (*) NEGATIVE Final   Comment:            The GeneXpert MRSA Assay (FDA     approved for NASAL specimens     only), is one component of a     comprehensive MRSA colonization     surveillance program. It is not     intended to diagnose MRSA     infection nor to guide or     monitor treatment for     MRSA infections.     RESULT CALLED TO, READ BACK BY AND VERIFIED WITH:     GRAHAM,K RN 0015 05/03/13 MITCHELL,L  CSF  CULTURE     Status: None   Collection Time    05/05/13 12:43 PM      Result Value Range Status   Specimen Description CSF   Final   Special Requests 1.0ML FLUID   Final   Gram Stain     Final   Value: CYTOSPIN SLIDE WBC PRESENT,BOTH PMN AND MONONUCLEAR     NO ORGANISMS SEEN   Culture NO GROWTH 1 DAY   Final   Report Status PENDING   Incomplete   GRAM STAIN     Status: None   Collection Time    05/05/13 12:43 PM      Result Value Range Status   Specimen Description CSF   Final   Special Requests 1.0ML FLUID   Final   Gram Stain     Final   Value: CYTOSPIN SLIDE:     WBC PRESENT,BOTH PMN AND MONONUCLEAR     NO ORGANISMS SEEN   Report Status 05/05/2013 FINAL   Final  CULTURE, BLOOD (ROUTINE X 2)     Status: None   Collection Time    05/05/13  2:50 PM      Result Value Range Status   Specimen Description BLOOD LEFT FINGER   Final   Special Requests BOTTLES DRAWN AEROBIC ONLY 6CC   Final   Culture  Setup Time 05/05/2013 21:09   Final   Culture     Final   Value:        BLOOD CULTURE RECEIVED NO GROWTH TO DATE CULTURE WILL BE HELD FOR 5 DAYS BEFORE ISSUING A FINAL NEGATIVE REPORT   Report Status PENDING   Incomplete  CULTURE, BLOOD (ROUTINE X 2)     Status: None   Collection Time    05/05/13  3:03 PM      Result Value Range Status   Specimen Description BLOOD LEFT ARM   Final   Special Requests BOTTLES DRAWN AEROBIC AND ANAEROBIC 10CC   Final   Culture  Setup Time 05/05/2013 21:09   Final   Culture     Final   Value:        BLOOD CULTURE RECEIVED NO GROWTH TO DATE CULTURE WILL BE HELD FOR 5 DAYS BEFORE ISSUING A FINAL NEGATIVE REPORT   Report Status PENDING   Incomplete     ANTIBIOTICS: Anti-infectives   None       SUBJ: 05/07/2013 Remains comatose.   VITAL SIGNS: Temp:  [97.7 F (36.5 C)-99.7 F (37.6 C)] 97.7 F (36.5 C) (07/12 0401) Pulse Rate:  [50-55] 50 (07/12 0600) Resp:  [14-30] 14 (07/12 0600) BP: (121-151)/(50-71) 125/61 mmHg (07/12 0600) SpO2:  [99 %-100 %] 100 % (07/12 0600) Arterial Line BP: (88-157)/(51-78) 134/55 mmHg (07/12 0600) FiO2 (%):  [29.9 %-40.3 %] 30 % (07/12 0600) Weight:  [115.3 kg (254 lb 3.1 oz)] 115.3 kg (254 lb 3.1 oz) (07/12 0500)  HEMODYNAMICS: CVP:  [0 mmHg-13 mmHg] 0 mmHg VENTILATOR SETTINGS: Vent Mode:  [-] PRVC FiO2 (%):  [29.9 %-40.3 %] 30 % Set Rate:  [14 bmp]  14 bmp Vt Set:  [500 mL] 500 mL PEEP:  [5 cmH20] 5 cmH20 Plateau Pressure:  [16 cmH20-18 cmH20] 17 cmH20  INTAKE / OUTPUT: Intake/Output     07/11 0701 - 07/12 0700 07/12 0701 - 07/13 0700   I.V. (mL/kg) 939.1 (8.1)    Other 100    NG/GT 20    IV Piggyback 368    Total Intake(mL/kg) 1427.1 (12.4)    Urine (  mL/kg/hr) 1228 (0.4)    Total Output 1228     Net +199.1            PHYSICAL EXAMINATION: General: comatose, NAD Neuro: no spont movement, no w/d from pain HEENT: PERRL, EOMI Cardiovascular:  RRR s M Lungs: clear Abdomen:  Soft, nontender, bowel sounds diminished Musculoskeletal: warm, no edema  LABS: See individual A and P  IMAGING x 48h Ct Head Wo Contrast  05/06/2013   *RADIOLOGY REPORT*  Clinical Data: Nonreactive pupils.  CT HEAD WITHOUT CONTRAST  Technique:  Contiguous axial images were obtained from the base of the skull through the vertex without contrast.  Comparison: 05/05/2013 CT and 05/03/2013 MR.  Findings: Right frontal lobe, right parietal lobe, right occipital lobe and posterior right temporal lobe acute/subacute infarcts. Surrounding edema with local mass effect.  Minimal petechial hemorrhage suspected particularly along the superior margin of the infarct.  No gross hemorrhage noted.  No midline shift.  The left hemispheric and infratentorial infarcts noted on the recent MRI are not as well demonstrated on the present CT.  No intracranial mass lesion detected on this unenhanced exam.  IMPRESSION: Question minimal petechial hemorrhage along the periphery of the right hemispheric infarcts as noted above.  Critical Value/emergent results were called by telephone at the time of interpretation on 05/06/2013 at 2:17 p.m. to Dr. Leonel Ramsay., who verbally acknowledged these results.   Original Report Authenticated By: Genia Del, M.D.   Dg Chest Port 1 View  05/06/2013   *RADIOLOGY REPORT*  Clinical Data: Fall of respiratory failure.  PORTABLE CHEST - 1 VIEW   Comparison: Chest x-ray from 2 days prior.  Findings: Endotracheal tube ends in the mid thoracic trachea. Gastric suction tube is coiled in the stomach.  Similar positioning of right IJ central venous catheter.  Worsening lower lung aeration, particularly on the left where the diaphragm is no longer distinct.  Probable cardiomegaly, although rightward rotation distorts the mediastinum.  No evident pneumothorax.  IMPRESSION:  1.  Stable positioning of support apparatus. 2.  Worsening aeration at the bases, particularly the left. Appearance may be related to under aeration or developing infiltrate.   Original Report Authenticated By: Jorje Guild   Ct Portable Head W/o Cm  05/05/2013   *RADIOLOGY REPORT*  Clinical Data: Status epilepticus.  Stroke.  CT HEAD WITHOUT CONTRAST  Technique:  Contiguous axial images were obtained from the base of the skull through the vertex without contrast.  Comparison: MRI 05/03/2013  Findings: Portable CT scanner was utilized at the bedside.  Initial images were degraded by EEG leads which were removed.  The study was repeated.  Patchy hypodensity in the right occipital parietal lobes compatible with acute infarct.  There is also acute infarct in the right posterior temporal lobe.  These correspond to areas of restricted diffusion on MRI.  Ventricle size is normal.  No acute hemorrhage.  Chronic microvascular ischemic changes in the white matter.  Calvarium is intact.  IMPRESSION: Multiple areas of hypodensity in the right MCA and PCA territory compatible with acute infarct without hemorrhage.  I reviewed the images with Dr. Leonel Ramsay   Original Report Authenticated By: Carl Best, M.D.     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 P:   Cont vent support Vent bundle   CARDIOVASCULAR   Recent Labs Lab 05/04/13 0540  TROPONINI <0.30   No results found for this basename: PROBNP,  in the last 168 hours  A: propofol/pentobarb induced hypotension H/O CHF - compensated Chronic hypertension  May 07 2013: On Levophed P:  Cont norepi to maintain MAP > 70 and SBP > 120  RENAL  Recent Labs Lab 05/02/13 2200 05/04/13 0540 05/06/13 0450 05/07/13 0500  NA 139 143 146* 145  K 3.9 3.5 4.5 4.1  CL 103 108 111 109  CO2 25 25 27 27   GLUCOSE 194* 248* 146* 163*  BUN 38* 43* 55* 56*  CREATININE 2.81* 2.10* 1.93* 1.71*  CALCIUM 8.1* 8.6 8.5 8.4  MG 1.8  --   --   --   PHOS 3.6  --   --   --    Intake/Output     07/11 0701 - 07/12 0700 07/12 0701 - 07/13 0700   I.V. (mL/kg) 939.1 (8.1)    Other 100    NG/GT 20    IV Piggyback 368    Total Intake(mL/kg) 1427.1 (12.4)    Urine (mL/kg/hr) 1228 (0.4)    Total Output 1228     Net +199.1            A:  Acute renal failure, unclear etiology, improving P:   COnt IVFs Monitor chemistries Correct electrolytes as indicated  GASTROINTESTINAL  Recent Labs Lab 05/02/13 2200  AST 18  ALT 10  ALKPHOS 74  BILITOT 0.3  PROT 5.7*  ALBUMIN 2.2*  INR 1.19    A:  05/07/13: Tube feeds held due to high residuals P:   Check KUB HoldTFs Cont SUP  HEMATOLOGIC  Recent Labs Lab 05/04/13 0540 05/06/13 0450 05/07/13 0500  HGB 10.6* 11.0* 11.2*  HCT 33.1* 34.1* 34.7*  WBC 9.5 11.6* 8.9  PLT 182 333 322    Recent Labs Lab 05/02/13 2200  INR 1.19    A:  Mild anemia. P:  Monitor CBC SCDs for DVT Px - PRBC for hgb </= 6.9gm%    - exceptions are   -  if ACS susepcted/confirmed then transfuse for hgb </= 8.0gm%,  or    -  If septic shock first 24h and scvo2 < 70% then transfuse for hgb </= 9.0gm%   - active bleeding with hemodynamic instability, then transfuse regardless of hemoglobin value   At  at all times try to transfuse 1 unit prbc as possible with exception of active hemorrhage    INFECTIOUS  Recent Labs Lab 05/02/13 2200 05/04/13 0540 05/06/13 0450 05/07/13 0500  WBC 15.1* 9.5 11.6* 8.9    Recent Labs Lab 05/02/13 2200  PROCALCITON 0.17    A:  No overt infections noted P:   Monitor off abx  ENDOCRINE  CBG (last 3)   Recent Labs  05/07/13 0522 05/07/13 0615 05/07/13 0648  GLUCAP 164* 146* 139*     A:  DM2. Hyperglycemia. 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 Well burst suppressed per neuro  P:   Check ssA, ssB, RF, CCP, Aldolase, CK, DS-DNA,   GBM,. Smith on 05/07/13 TEE ordered - planned for 7/14 Anti-convulsants per Neuro Vasculitis w/u  per Neuro Empiric steroids   I have personally obtained a history, examined the patient, evaluated laboratory and imaging results, formulated the assessment and plan and placed orders. D.w Dr Corliss Blacker Kirkpatric  CRITICAL CARE:  The patient is critically ill with multiple organ systems failure and requires high complexity decision making for assessment and support, frequent evaluation and titration of therapies, application of advanced monitoring technologies and extensive interpretation of multiple databases. Critical Care Time devoted to patient care services described in this note is 30 minutes.     Dr. Kalman Shan, M.D., [facility].C.P Pulmonary and Critical Care Medicine Staff Physician [facility] Pager: [phone], If no answer or between  15:00h - 7:00h: call [phone]  05/07/2013 7:38 AM

## 2013-05-07 NOTE — Progress Notes (Signed)
KUB results called to Dr. Marchelle Gearing. Discussed elevated residuals. Plan to restart TF and continue to monitor residuals per protocol.

## 2013-05-07 NOTE — Procedures (Signed)
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 Occasionally there continued to be sharply contoured waves predominant in the right hemisphere that occurred during the bursts.   EEG Abnormalities: 1) burst suppression pattern  Clinical Interpretation: This EEG is consistent with a profound diffuse cerebral dysfunction as can be seen in medication-induced coma. No seizures were seen.  [name], MD [facility] [phone]  If 7pm- 7am, please page neurology on call at [phone].

## 2013-05-07 NOTE — Progress Notes (Signed)
eLink Physician-Brief Progress Note Patient Name: Brenda Pope DOB: Jun 28, 1956 MRN: [id number]  Date of Service  05/07/2013   HPI/Events of Note  Call from nurse reporting high residuals of greater than 600 cc in patient receiving TFs and free water on phenobarb gtt.  Has reduced BS on clinical exam per nurse report.   eICU Interventions  Plan: Hold TFs for tonight Continue FW Add reglan 10 mg IV q8 hours as pro- motility agent      DETERDING,ELIZABETH 05/07/2013, 12:35 AM

## 2013-05-07 NOTE — Progress Notes (Signed)
Notified Elink MD regarding TF residuals > 400. Plan to hold TF and restart in a few hours per recommendations. Will recheck residuals as scheduled at 2000 and restart if less than 400.

## 2013-05-07 NOTE — Progress Notes (Addendum)
Subjective: Continues to be in burst suppression  Exam: Filed Vitals:   05/07/13 0810  BP: 136/57  Pulse: 50  Temp:   Resp: 13   Gen: In bed, intubated Exam is severely confounded by medically induced, Mental status: Comatose  CN: Pupils minimally reactive bilaterally, right greater than left as on previous days Other testing is precluded by coma.   Impression: 57 yo F with recurrent strokes and status epieltpcius. Her angiogram yesterday did show some irregular narrowing which could be consistent with vasculitis. 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  It is also possible that this represents cardioembolic disease, but at this point I feel that this is less likely.  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 5) TEE on Monday  [name], MD [facility] [phone]  If 7pm- 7am, please page neurology on call at [phone].

## 2013-05-08 ENCOUNTER — Inpatient Hospital Stay (HOSPITAL_COMMUNITY): Payer: Medicaid Other

## 2013-05-08 LAB — GLUCOSE, CAPILLARY
Glucose-Capillary: 160 mg/dL — ABNORMAL HIGH (ref 70–99)
Glucose-Capillary: 168 mg/dL — ABNORMAL HIGH (ref 70–99)
Glucose-Capillary: 125 mg/dL — ABNORMAL HIGH (ref 70–99)
Glucose-Capillary: 127 mg/dL — ABNORMAL HIGH (ref 70–99)
Glucose-Capillary: 137 mg/dL — ABNORMAL HIGH (ref 70–99)
Glucose-Capillary: 147 mg/dL — ABNORMAL HIGH (ref 70–99)

## 2013-05-08 LAB — CBC
MCH: 29.6 pg (ref 26.0–34.0)
MCHC: 32 g/dL (ref 30.0–36.0)
MCV: 92.5 fL (ref 78.0–100.0)
Platelets: 344 10*3/uL (ref 150–400)
RBC: 4.42 MIL/uL (ref 3.87–5.11)

## 2013-05-08 LAB — MAGNESIUM: Magnesium: 2.3 mg/dL (ref 1.5–2.5)

## 2013-05-08 LAB — PROCALCITONIN: Procalcitonin: <0.1 ng/mL

## 2013-05-08 LAB — BASIC METABOLIC PANEL
GFR calc Af Amer: 35 mL/min — ABNORMAL LOW (ref >90)
GFR calc non Af Amer: 31 mL/min — ABNORMAL LOW (ref >90)
Potassium: 3.9 mEq/L (ref 3.5–5.1)
Sodium: 147 mEq/L — ABNORMAL HIGH (ref 135–145)

## 2013-05-08 LAB — CSF CULTURE W GRAM STAIN

## 2013-05-08 LAB — LACTIC ACID, PLASMA
Lactic Acid, Venous: 3 mmol/L — ABNORMAL HIGH (ref 0.5–2.2)
Lactic Acid, Venous: 1.2 mmol/L (ref 0.5–2.2)

## 2013-05-08 MED ORDER — PANTOPRAZOLE SODIUM 40 MG IV SOLR
40.0000 mg | INTRAVENOUS | Status: DC
  Administered 2013-05-08 – 2013-05-10 (×3): 40 mg via INTRAVENOUS
  Filled 2013-05-08 (×3): qty 40

## 2013-05-08 MED ORDER — SODIUM CHLORIDE 0.9 % IV BOLUS (SEPSIS)
1000.0000 mL | Freq: Once | INTRAVENOUS | Status: AC
  Administered 2013-05-08: 1000 mL via INTRAVENOUS

## 2013-05-08 MED ORDER — NOREPINEPHRINE BITARTRATE 1 MG/ML IJ SOLN
2.0000 ug/min | INTRAVENOUS | Status: DC
  Administered 2013-05-10: 1.333 ug/min via INTRAVENOUS
  Administered 2013-05-11: 2 ug/min via INTRAVENOUS
  Filled 2013-05-08 (×3): qty 4

## 2013-05-08 MED ORDER — SODIUM CHLORIDE 0.9 % IV BOLUS (SEPSIS)
1000.0000 mL | Freq: Every day | INTRAVENOUS | Status: DC | PRN
  Administered 2013-05-08 – 2013-05-09 (×4): 1000 mL via INTRAVENOUS

## 2013-05-08 NOTE — Progress Notes (Signed)
PULMONARY  / CRITICAL CARE MEDICINE  Name: Brenda Pope MRN: [id number] DOB: 1956-01-07    ADMISSION DATE:  05/02/2013 CONSULTATION DATE:  7/7/104  REFERRING MD :  Transfer from [place]:  PCCM  CHIEF COMPLAINT:  Status epilepticus  BRIEF PATIENT DESCRIPTION: 56 yo with past medical history of CVAs / TIAs and diastolic heart failure, severe baseline hypertension with noncompliance, poorly controlled diabetes and general this like a medical care. She was admitted to [facility] on 7/5 with acute dyspnea and suspected CHF exacerbation.  Later on the day of admission developed developed neurological findings consistent with acute CVA.  On 7/6 intubated for airway protection during suspected seizure activity.  Hypotensive post intubation requiring vasopressors. MRI confirmed small acute infarcts of the right parietal lobe. On 7/7 transferred to [facility] for further management.  SIGNIFICANT EVENTS / STUDIES:  7/5  Admitted to [facility] with CHF exacerbation; stroke symptoms 7/6  Suspected seizure activity, intubated for airway protection; hypotensive post intubation requiring vasopressors 7/6  Brain MRI:  Multiple small acute infarcts R parietal lobe, possible small infarcts left basal ganglia and central pons 7/6  TTE:  No source of CVA, EF 55-60% 7/6  Carotid Doppler:  No evidence of hemodynamically significant stenosis 7/7  Transferred to [facility], PCCM serivce 7/7 Neuro consult: "Concern patient is on SE in the context of recent cortical stroke." 7/8 MRI brain: Confluent and scattered right MCA and right PCA acute infarcts, plus scattered small left hemisphere and occasional posterior fossa acute infarcts. The vast majority of these infarcts are newly seen since 05/01/2013.  This appearance suggests embolic phenomena which severely affected more proximal/medium-sized vessels of the right MCA and PCA territories. Cytotoxic edema without significant mass effect at this  time. No associated hemorrhage 7/8 EEG: severe diffuse encephalopathic process with asymmetrical involvement. Findings reported from the left hemisphere were nonspecific. Findings record from the right hemisphere were consistent with acute large area of infarction. No frank epileptiform activity was recorded. 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  7/10 LP performed by Neuro:  7/10 Burst suppression with propofol. Norepi initiated to prevent propofol induced hypotension. Consideration of primary CNS vasculitis. Serologies ordered>> CRP, ESR markedly elevated. High dose methylpred ordered. Insulin gtt ordered while on high dose steriods 7/10 propofol ineffective at burst suppression. pentobarbitol initiated 7/11 CT head:  7/11 angiogram eval for vasculitis:   LINES / TUBES: ETT 7/6 >>  R IJ CVL 7/6 >>  L radial A-line 7/10 >> 05/07/13   CULTURES: MRSA PCR 7/07 >> POS Blood culture LP  AUTOIMMUNE  rheumatoid factor, ANCA, ANA is negative on 05/07/2013  ANTIBIOTICS: Anti-infectives   None     Solumedrol 1gm for autoimmune ? Date  >>  SUBJ: 05/08/2013 Remains comatose. Phenobarb dc'ed following burst suprpression  VITAL SIGNS: Temp:  [95.4 F (35.2 C)-97.7 F (36.5 C)] 97.7 F (36.5 C) (07/13 0815) Pulse Rate:  [45-57] 52 (07/13 1100) Resp:  [14-15] 14 (07/13 1100) BP: (73-152)/(43-70) 130/60 mmHg (07/13 1100) SpO2:  [96 %-100 %] 97 % (07/13 1100) FiO2 (%):  [30 %] 30 % (07/13 0800)  HEMODYNAMICS: CVP:  [4 mmHg-10 mmHg] 8 mmHg VENTILATOR SETTINGS: Vent Mode:  [-] PRVC FiO2 (%):  [30 %] 30 % Set Rate:  [14 bmp] 14 bmp Vt Set:  [500 mL] 500 mL PEEP:  [5 cmH20]  5 cmH20 Plateau Pressure:  [17 cmH20-18 cmH20] 18 cmH20  INTAKE / OUTPUT: Intake/Output     07/12 0701 - 07/13 0700 07/13 0701 -  07/14 0700   I.V. (mL/kg) 1516.3 (13.2) 100.9 (0.9)   Other 60    NG/GT 592.3 20   IV Piggyback 323 110   Total Intake(mL/kg) 2491.6 (21.6) 230.9 (2)   Urine (mL/kg/hr) 975 (0.4) 40 (0.1)   Total Output 975 40   Net +1516.6 +190.9          PHYSICAL EXAMINATION: General: comatose, NAD. Off phenobarbital.  nothing for sedation Neuro: no spont movement, no w/d from pain HEENT: PERRL, EOMI Cardiovascular:  RRR s M Lungs: clear Abdomen:  Soft, nontender, bowel sounds diminished Musculoskeletal: warm, no edema  LABS: See individual A and P  IMAGING x 48h Ct Head Wo Contrast  05/06/2013   *RADIOLOGY REPORT*  Clinical Data: Nonreactive pupils.  CT HEAD WITHOUT CONTRAST  Technique:  Contiguous axial images were obtained from the base of the skull through the vertex without contrast.  Comparison: 05/05/2013 CT and 05/03/2013 MR.  Findings: Right frontal lobe, right parietal lobe, right occipital lobe and posterior right temporal lobe acute/subacute infarcts. Surrounding edema with local mass effect.  Minimal petechial hemorrhage suspected particularly along the superior margin of the infarct.  No gross hemorrhage noted.  No midline shift.  The left hemispheric and infratentorial infarcts noted on the recent MRI are not as well demonstrated on the present CT.  No intracranial mass lesion detected on this unenhanced exam.  IMPRESSION: Question minimal petechial hemorrhage along the periphery of the right hemispheric infarcts as noted above.  Critical Value/emergent results were called by telephone at the time of interpretation on 05/06/2013 at 2:17 p.m. to Dr. Amada Jupiter., who verbally acknowledged these results.   Original Report Authenticated By: Lacy Duverney, M.D.   Dg Chest Port 1 View  05/08/2013   *RADIOLOGY REPORT*  Clinical Data: Acute cerebral infarct.  Acute respiratory failure. On ventilator.  PORTABLE CHEST - 1 VIEW  Comparison: 05/06/2013  Findings: Right jugular center venous  catheter, nasogastric tube and endotracheal tube remain in appropriate position.  Improved aeration is seen in the right lung base.  There is persistent atelectasis at the left lung base.  No evidence of pulmonary consolidation.  Heart size is stable.  IMPRESSION: Decreased right basilar atelectasis, with persistent atelectasis in left lung base.   Original Report Authenticated By: Myles Rosenthal, M.D.   Dg Abd Portable 1v  05/07/2013   *RADIOLOGY REPORT*  Clinical Data: Ileus.  PORTABLE ABDOMEN - 1 VIEW  Comparison: None available.  Findings: The sideport of an NG tube is within the fundus the stomach.  The bowel gas pattern is unremarkable.  There is no evidence for obstruction or free air.  The axial skeleton is unremarkable.  IMPRESSION: Negative one-view abdomen.   Original Report Authenticated By: Marin Roberts, M.D.     ASSESSMENT / PLAN:  PULMONARY  Recent Labs Lab 05/02/13 2123 05/03/13 0440  PHART 7.434 7.427  PCO2ART 35.1 38.1  PO2ART 168.0* 127.0*  HCO3 23.1 24.7*  TCO2 24.2 25.8  O2SAT 99.8 99.4    A:  Acute respiratory failure due to AMS  05/08/2013: Does not meet spontaneous breathing trial criteria due to   coma P:   Cont vent support Vent bundle   CARDIOVASCULAR    Recent Labs Lab 05/04/13 0540  TROPONINI <0.30   No results found for this basename: PROBNP,  in the last 168 hours  A: propofol/pentobarb induced hypotension H/O CHF - compensated Chronic hypertension  May 08 2013: On Levophed at 4 mcg. CVP 6. Lactic acid is elevated at 3 P:  Fluid bolus to get CVP greater than 12 Cont norepi to maintain MAP > 70 and SBP > 120   RENAL  Recent Labs Lab 05/02/13 2200 05/04/13 0540 05/06/13 0450 05/07/13 0500 05/08/13 0510  NA 139 143 146* 145 147*  K 3.9 3.5 4.5 4.1 3.9  CL 103 108 111 109 108  CO2 25 25 27 27 29   GLUCOSE 194* 248* 146* 163* 166*  BUN 38* 43* 55* 56* 64*  CREATININE 2.81* 2.10* 1.93* 1.71* 1.78*  CALCIUM 8.1* 8.6 8.5 8.4  8.6  MG 1.8  --   --   --  2.3  PHOS 3.6  --   --   --  5.7*   Intake/Output     07/12 0701 - 07/13 0700 07/13 0701 - 07/14 0700   I.V. (mL/kg) 1516.3 (13.2) 100.9 (0.9)   Other 60    NG/GT 592.3 20   IV Piggyback 323 110   Total Intake(mL/kg) 2491.6 (21.6) 230.9 (2)   Urine (mL/kg/hr) 975 (0.4) 40 (0.1)   Total Output 975 40   Net +1516.6 +190.9          A:  Acute renal failure, unclear etiology, improving P:   COnt IVFs Monitor chemistries Correct electrolytes as indicated  GASTROINTESTINAL  Recent Labs Lab 05/02/13 2200  AST 18  ALT 10  ALKPHOS 74  BILITOT 0.3  PROT 5.7*  ALBUMIN 2.2*  INR 1.19    A:  05/08/13: Tube feeds held due to high residuals for second time. KUB was normal without ileus. Reglan has been started P:   Continue to hold tube feeds Rechallenge 05/09/2013 Continue Reglan Cont SUP  HEMATOLOGIC  Recent Labs Lab 05/06/13 0450 05/07/13 0500 05/08/13 0510  HGB 11.0* 11.2* 13.1  HCT 34.1* 34.7* 40.9  WBC 11.6* 8.9 7.0  PLT 333 322 344    Recent Labs Lab 05/02/13 2200  INR 1.19    A:  Mild anemia. P:  Monitor CBC SCDs for DVT Px - PRBC for hgb </= 6.9gm%    - exceptions are   -  if ACS susepcted/confirmed then transfuse for hgb </= 8.0gm%,  or    -  If septic shock first 24h and scvo2 < 70% then transfuse for hgb </= 9.0gm%   - active bleeding with hemodynamic instability, then transfuse regardless of hemoglobin value   At at all times try to transfuse 1 unit prbc as possible with exception of active hemorrhage    INFECTIOUS  Recent Labs Lab 05/02/13 2200 05/04/13 0540 05/06/13 0450 05/07/13 0500 05/08/13 0510  WBC 15.1* 9.5 11.6* 8.9 7.0    Recent Labs Lab 05/02/13 2200 05/07/13 0740 05/08/13 0510  LATICACIDVEN  --   --  3.0*  PROCALCITON 0.17 <0.10 <0.10    A:  No overt infections noted but she has lactic acidosis P:   Monitor off abx Recheck lactate after fluid bolus  ENDOCRINE  CBG (last 3)    Recent Labs  05/08/13 0015 05/08/13 0051 05/08/13 0213  GLUCAP 155* 168* 160*     A:  DM2. Hyperglycemia. P:   Cont insulin infusion while on high dose steroids   NEUROLOGIC A:  Acute CVA in multiple territories  - possibly embolic Acute encephalopathy Status epilepticus Concern for CNS vasculitis: ANCA negative 05/05/13  05/07/13 - On pentobarb coma since  05/05/13. Well burst suppressed per neuro 05/18/13: Off pentobarb. COmatose. Wll burst suppressed  P:   Await ssA, ssB, RF, CCP, Aldolase, CK, DS-DNA,   GBM,. Smith on 05/07/13 TEE ordered - planned for 7/14 Anti-convulsants per Neuro Vasculitis w/u per Neuro Empiric steroids   I have personally obtained a history, examined the patient, evaluated laboratory and imaging results, formulated the assessment and plan and placed orders. D.w Dr Addison Lank Kirkpatric  CRITICAL CARE:  The patient is critically ill with multiple organ systems failure and requires high complexity decision making for assessment and support, frequent evaluation and titration of therapies, application of advanced monitoring technologies and extensive interpretation of multiple databases. Critical Care Time devoted to patient care services described in this note is 30 minutes.     Dr. Brand Males, M.D., [facility].C.P Pulmonary and Critical Care Medicine Staff Physician [facility] Pager: [phone], If no answer or between  15:00h - 7:00h: call [phone]  05/08/2013 12:11 PM

## 2013-05-08 NOTE — Progress Notes (Signed)
Elink MD notified of 1600 CVP 12. Per order after boluses and CVP 12- call for further orders. MD stated no further fluid needed. Will cont. to monitor.

## 2013-05-08 NOTE — Progress Notes (Signed)
Subjective: Continues to be in burst suppression  Exam: Filed Vitals:   05/08/13 0815  BP: 128/61  Pulse: 54  Temp: 97.7 F (36.5 C)  Resp: 14   Gen: In bed, intubated Exam is severely confounded by medically induced coma Mental status: Comatose CN: Pupils non-reactive.  Other testing is precluded by coma.   RF normal, ESR CRP  elevated. IgG index was at the upper limit of normal.   Impression: 57 yo F with recurrent strokes and status epieltpcius. Her angiogram did show some irregular narrowing which could be consistent with vasculitis. 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  It is also possible that this represents cardioembolic disease, but at this point I feel that this is less likely.  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    Recommendations: 1) continue solumedrol 1 g daily 2) Will stop pentobarbital to try to allow the suppression to lighten.  3) continue other AEDs at current doses 4) TEE on Monday 5) Vasculitis workup pending  [name], MD [facility] [phone]  If 7pm- 7am, please page neurology on call at [phone].

## 2013-05-09 ENCOUNTER — Inpatient Hospital Stay (HOSPITAL_COMMUNITY): Admission: RE | Admit: 2013-05-09 | Payer: Self-pay | Source: Ambulatory Visit | Admitting: Cardiovascular Disease

## 2013-05-09 ENCOUNTER — Encounter (HOSPITAL_COMMUNITY): Admission: RE | Payer: Self-pay | Source: Ambulatory Visit

## 2013-05-09 DIAGNOSIS — I6789 Other cerebrovascular disease: Secondary | ICD-10-CM

## 2013-05-09 LAB — GLUCOSE, CAPILLARY
Glucose-Capillary: 136 mg/dL — ABNORMAL HIGH (ref 70–99)
Glucose-Capillary: 166 mg/dL — ABNORMAL HIGH (ref 70–99)
Glucose-Capillary: 215 mg/dL — ABNORMAL HIGH (ref 70–99)
Glucose-Capillary: 196 mg/dL — ABNORMAL HIGH (ref 70–99)
Glucose-Capillary: 180 mg/dL — ABNORMAL HIGH (ref 70–99)
Glucose-Capillary: 144 mg/dL — ABNORMAL HIGH (ref 70–99)
Glucose-Capillary: 123 mg/dL — ABNORMAL HIGH (ref 70–99)
Glucose-Capillary: 151 mg/dL — ABNORMAL HIGH (ref 70–99)
Glucose-Capillary: 124 mg/dL — ABNORMAL HIGH (ref 70–99)
Glucose-Capillary: 155 mg/dL — ABNORMAL HIGH (ref 70–99)
Glucose-Capillary: 174 mg/dL — ABNORMAL HIGH (ref 70–99)
Glucose-Capillary: 176 mg/dL — ABNORMAL HIGH (ref 70–99)
Glucose-Capillary: 177 mg/dL — ABNORMAL HIGH (ref 70–99)
Glucose-Capillary: 162 mg/dL — ABNORMAL HIGH (ref 70–99)
Glucose-Capillary: 152 mg/dL — ABNORMAL HIGH (ref 70–99)

## 2013-05-09 LAB — ANTI-SCLERODERMA ANTIBODY: Scleroderma (Scl-70) (ENA) Antibody, IgG: <1 AU/mL (ref <30)

## 2013-05-09 LAB — SJOGRENS SYNDROME-B EXTRACTABLE NUCLEAR ANTIBODY: SSB (La) (ENA) Antibody, IgG: <1 AU/mL (ref <30)

## 2013-05-09 LAB — ANTI-SMITH ANTIBODY: ENA SM Ab Ser-aCnc: 2 AU/mL (ref <30)

## 2013-05-09 LAB — CYCLIC CITRUL PEPTIDE ANTIBODY, IGG: Cyclic Citrullin Peptide Ab: <2 U/mL (ref 0.0–5.0)

## 2013-05-09 SURGERY — ECHOCARDIOGRAM, TRANSESOPHAGEAL
Anesthesia: Moderate Sedation

## 2013-05-09 MED ORDER — SODIUM CHLORIDE 0.9 % IV SOLN: INTRAVENOUS | Status: DC

## 2013-05-09 MED ORDER — VITAL AF 1.2 CAL PO LIQD
1000.0000 mL | ORAL | Status: DC
  Administered 2013-05-09: 1000 mL
  Filled 2013-05-09: qty 1000

## 2013-05-09 NOTE — Progress Notes (Signed)
Echocardiogram Echocardiogram Transesophageal has been performed.  Brenda Pope 05/09/2013, 1:18 PM

## 2013-05-09 NOTE — Progress Notes (Addendum)
Subjective: EEG shows burst suppression, but in a much lighter state than yesterday. No seizures seen on brief bedside review.   Exam: Filed Vitals:   05/09/13 0400  BP:   Pulse:   Temp: 97.4 F (36.3 C)  Resp:    Gen: In bed, intubated Exam is severely confounded by medically induced coma Mental status: Comatose CN: Pupils reactive bilaterally, no corneals.  Other testing is confounded by coma  RF normal, ESR CRP  elevated. IgG index was at the upper limit of normal.   Impression: 57 yo F with recurrent strokes and status epilepticus. Her angiogram did show some irregular narrowing which could be consistent with vasculitis. 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  It is also possible that this represents cardioembolic disease, but at this point I feel that this is less likely.  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    Recommendations: 1) continue solumedrol 1 g daily 2) dilantin level.  3) TEE today 4) Vasculitis workup pending 5) continue other AEDs.   [name], MD [facility] [phone]  If 7pm- 7am, please page neurology on call at [phone].

## 2013-05-09 NOTE — Procedures (Signed)
History: 57 year old female refractory status epilepticus currently number suppression   This continuous EEG recorded from 10:25AM 05/07/13 until 10:23 AM 05/08/13   Sedation: pentobarbital   Background: The entirety of this recording consists of a burst suppression pattern. Occasionally there continued to be sharply contoured waves predominant in the right hemisphere that occurred during the bursts.   EEG Abnormalities: 1) burst suppression pattern   Clinical Interpretation: This EEG is consistent with a profound diffuse cerebral dysfunction as can be seen in medication-induced coma. No seizures were seen.   [name], MD  [facility]  [phone]  If 7pm- 7am, please page neurology on call at [phone].

## 2013-05-09 NOTE — H&P (Signed)
  See H and P in non merged chart.  TEE ordered by neuro for patient with CVA TTE reviewed and normal except for severe LVH  Brenda Pope

## 2013-05-09 NOTE — Progress Notes (Signed)
Tube feeds remained off due to high residuals. Hooked OG to ILWS and pulled in about 3 hours. Drs. Z and Wert made aware. CVP 8-10 gave NS bolus as ordered PRN. Still titrating levophed. Continuing to monitor.

## 2013-05-09 NOTE — Progress Notes (Signed)
PULMONARY  / CRITICAL CARE MEDICINE  Name: Brenda Pope MRN: [id number] DOB: 04-Apr-1956    ADMISSION DATE:  05/02/2013 CONSULTATION DATE:  7/7/104  REFERRING MD :  Transfer from [place]:  PCCM  CHIEF COMPLAINT:  Status epilepticus  BRIEF PATIENT DESCRIPTION: 57 yo with past medical history of CVAs / TIAs and diastolic heart failure, severe baseline hypertension with noncompliance, poorly controlled diabetes and general this like a medical care. She was admitted to [facility] on 7/5 with acute dyspnea and suspected CHF exacerbation.  Later on the day of admission developed developed neurological findings consistent with acute CVA.  On 7/6 intubated for airway protection during suspected seizure activity.  Hypotensive post intubation requiring vasopressors. MRI confirmed small acute infarcts of the right parietal lobe. On 7/7 transferred to [facility] for further management.  SIGNIFICANT EVENTS / STUDIES:  7/5  Admitted to [place] [facility] with CHF exacerbation; stroke symptoms 7/6  Suspected seizure activity, intubated for airway protection; hypotensive post intubation requiring vasopressors 7/6  Brain MRI:  Multiple small acute infarcts R parietal lobe, possible small infarcts left basal ganglia and central pons 7/6  TTE:  No source of CVA, EF 55-60% 7/6  Carotid Doppler:  No evidence of hemodynamically significant stenosis 7/7  Transferred to [facility], PCCM serivce 7/7 Neuro consult: "Concern patient is on SE in the context of recent cortical stroke." 7/8 MRI brain: Confluent and scattered right MCA and right PCA acute infarcts, plus scattered small left hemisphere and occasional posterior fossa acute infarcts. The vast majority of these infarcts are newly seen since 05/01/2013.  This appearance suggests embolic phenomena which severely affected more proximal/medium-sized vessels of the right MCA and PCA territories. Cytotoxic edema without significant mass effect at this  time. No associated hemorrhage 7/8 EEG: severe diffuse encephalopathic process with asymmetrical involvement. Findings reported from the left hemisphere were nonspecific. Findings record from the right hemisphere were consistent with acute large area of infarction. No frank epileptiform activity was recorded. 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  7/10 LP performed by Neuro:  7/10 Burst suppression with propofol. Norepi initiated to prevent propofol induced hypotension. Consideration of primary CNS vasculitis. Serologies ordered>> CRP, ESR markedly elevated. High dose methylpred ordered. Insulin gtt ordered while on high dose steriods 7/10 propofol ineffective at burst suppression. pentobarbitol initiated 7/11 CT head:  7/11 angiogram eval for vasculitis:   LINES / TUBES: ETT 7/6 >>  R IJ CVL 7/6 >>  L radial A-line 7/10 >> 05/07/13   CULTURES: MRSA PCR 7/07 >> POS Blood culture >>ng CSF 7/10 >> ng  AUTOIMMUNE  rheumatoid factor, ANCA, ANA is negative   ANTIBIOTICS: Anti-infectives   None     Solumedrol 1gm for autoimmune ? Date  >>  SUBJ: Afebrile  Remains comatose. Phenobarb dc'ed following burst suprpression On low dose levophed  VITAL SIGNS: Temp:  [95.4 F (35.2 C)-97.6 F (36.4 C)] 97.4 F (36.3 C) (07/14 0400) Pulse Rate:  [45-56] 54 (07/14 0950) Resp:  [14-20] 20 (07/14 0950) BP: (77-156)/(49-92) 144/63 mmHg (07/14 0950) SpO2:  [96 %-100 %] 100 % (07/14 0950) FiO2 (%):  [30 %] 30 % (07/14 0901)  HEMODYNAMICS: CVP:  [5 mmHg-34 mmHg] 9 mmHg VENTILATOR SETTINGS: Vent Mode:  [-] PRVC FiO2 (%):  [30 %] 30 % Set Rate:  [14 bmp] 14 bmp Vt Set:  [500 mL] 500  mL PEEP:  [5 cmH20] 5 cmH20 Plateau Pressure:  [8 cmH20-19 cmH20] 17 cmH20  INTAKE / OUTPUT: Intake/Output     07/13 0701 - 07/14 0700 07/14 0701 -  07/15 0700   I.V. (mL/kg) 650.3 (5.6)    Other  1000   NG/GT 51.8    IV Piggyback 5258    Total Intake(mL/kg) 5960.2 (51.7) 1000 (8.7)   Urine (mL/kg/hr) 790 (0.3) 120 (0.3)   Emesis/NG output 1100 (0.4)    Total Output 1890 120   Net +4070.2 +880          PHYSICAL EXAMINATION: General: comatose, NAD. Off phenobarbital. On no sedation Neuro: no spont movement, no w/d from pain HEENT: PERRL, EOMI Cardiovascular:  RRR s M Lungs: clear Abdomen:  Soft, nontender, bowel sounds diminished, TFs on hold sue to high residuals Musculoskeletal: warm, no edema  LABS: See individual A and P  IMAGING x 48h Dg Chest Port 1 View  05/08/2013   *RADIOLOGY REPORT*  Clinical Data: Acute cerebral infarct.  Acute respiratory failure. On ventilator.  PORTABLE CHEST - 1 VIEW  Comparison: 05/06/2013  Findings: Right jugular center venous catheter, nasogastric tube and endotracheal tube remain in appropriate position.  Improved aeration is seen in the right lung base.  There is persistent atelectasis at the left lung base.  No evidence of pulmonary consolidation.  Heart size is stable.  IMPRESSION: Decreased right basilar atelectasis, with persistent atelectasis in left lung base.   Original Report Authenticated By: Earle Gell, M.D.     ASSESSMENT / PLAN:  PULMONARY  Recent Labs Lab 05/02/13 2123 05/03/13 0440  PHART 7.434 7.427  PCO2ART 35.1 38.1  PO2ART 168.0* 127.0*  HCO3 23.1 24.7*  TCO2 24.2 25.8  O2SAT 99.8 99.4    A:  Acute respiratory failure due to AMS  P:   Cont vent support - while in coma Vent bundle   CARDIOVASCULAR    Recent Labs Lab 05/04/13 0540  TROPONINI <0.30   No results found for this basename: PROBNP,  in the last 168 hours  A: propofol/pentobarb induced hypotension H/O CHF - compensated Chronic hypertension Lactate decreased from 3 to 1.7 P:  Taper levo to off   RENAL  Recent Labs Lab 05/02/13 2200 05/04/13 0540 05/06/13 0450 05/07/13 0500  05/08/13 0510  NA 139 143 146* 145 147*  K 3.9 3.5 4.5 4.1 3.9  CL 103 108 111 109 108  CO2 25 25 27 27 29   GLUCOSE 194* 248* 146* 163* 166*  BUN 38* 43* 55* 56* 64*  CREATININE 2.81* 2.10* 1.93* 1.71* 1.78*  CALCIUM 8.1* 8.6 8.5 8.4 8.6  MG 1.8  --   --   --  2.3  PHOS 3.6  --   --   --  5.7*   Intake/Output     07/13 0701 - 07/14 0700 07/14 0701 - 07/15 0700   I.V. (mL/kg) 650.3 (5.6)    Other  1000   NG/GT 51.8    IV Piggyback 5258    Total Intake(mL/kg) 5960.2 (51.7) 1000 (8.7)   Urine (mL/kg/hr) 790 (0.3) 120 (0.3)   Emesis/NG output 1100 (0.4)    Total Output 1890 120   Net +4070.2 +880          A:  Acute renal failure, unclear etiology, improving P:   COnt IVFs Monitor chemistries Correct electrolytes as indicated  GASTROINTESTINAL  Recent Labs Lab 05/02/13 2200  AST 18  ALT 10  ALKPHOS 74  BILITOT 0.3  PROT  5.7*  ALBUMIN 2.2*  INR 1.19    A:  05/08/13: Tube feeds held due to high residuals for second time. KUB was normal without ileus. Reglan has been started P:   Continue to hold tube feeds Rechallenge 05/09/2013 at lower dose Continue Reglan Cont SUP  HEMATOLOGIC  Recent Labs Lab 05/06/13 0450 05/07/13 0500 05/08/13 0510  HGB 11.0* 11.2* 13.1  HCT 34.1* 34.7* 40.9  WBC 11.6* 8.9 7.0  PLT 333 322 344    Recent Labs Lab 05/02/13 2200  INR 1.19    A:  Mild anemia. P:  Monitor CBC SCDs for DVT Px - PRBC for hgb </= 6.9gm%        INFECTIOUS  Recent Labs Lab 05/02/13 2200 05/04/13 0540 05/06/13 0450 05/07/13 0500 05/08/13 0510  WBC 15.1* 9.5 11.6* 8.9 7.0    Recent Labs Lab 05/07/13 0740 05/08/13 0510 05/08/13 1520 05/09/13 0230 05/09/13 0235  LATICACIDVEN  --  3.0* 1.2  --  1.7  PROCALCITON <0.10 <0.10  --  <0.10  --     A:  No overt infections ,  lactic acidosis improved from 3 to 1.7- more likely related to seizures rather than infection P:   Monitor off abx   ENDOCRINE  CBG (last 3)   Recent Labs   05/09/13 0650 05/09/13 0803 05/09/13 0850  GLUCAP 136* 123* 167*     A:  DM2. Hyperglycemia. P:   Cont insulin infusion while on high dose steroids   NEUROLOGIC A:  Acute CVA in multiple territories  - possibly embolic Acute encephalopathy Status epilepticus Concern for CNS vasculitis: ANCA negative, ESR CRP elevated On pentobarb coma from 05/05/13 - 05/08/13. Well burst suppressed per neuro   P:   Await ssA, ssB, RF, CCP, Aldolase, CK, DS-DNA,   GBM,. Smith on 05/07/13 TEE ordered - planned for 7/14 Anti-convulsants per Neuro Vasculitis w/u per Neuro Empiric steroids   I have personally obtained a history, examined the patient, evaluated laboratory and imaging results, formulated the assessment and plan and placed orders.   CRITICAL CARE:  The patient is critically ill with multiple organ systems failure and requires high complexity decision making for assessment and support, frequent evaluation and titration of therapies, application of advanced monitoring technologies and extensive interpretation of multiple databases. Critical Care Time devoted to patient care services described in this note is 32 minutes.    Kara Mead MD. Shade Flood. [place] [facility] Pager [phone] If no response call [phone]   05/09/2013 10:08 AM

## 2013-05-09 NOTE — Procedures (Signed)
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   Background: The entirety of this recording consists of a burst suppression pattern. The bursts become longer and the periods of suppression shorter throughout the course of the recording.  Occasionally there continue to be sharply contoured waves predominant in the right hemisphere that occurred during the bursts.   EEG Abnormalities: 1) burst suppression pattern  2) Right hemispheric sharp waves.   Clinical Interpretation: This EEG is consistent with a profound diffuse cerebral dysfunction as can be seen in medication-induced coma. There is evidence of a seizure predisposition on the right. No seizures were seen.   [name], MD  [facility]  [phone]  If 7pm- 7am, please page neurology on call at [phone].

## 2013-05-09 NOTE — CV Procedure (Signed)
TEE: Indication: CVA   No medication given patient on vent in coma NICU  LVH EF 60% no RWMA Normal MV/AV/TV/PV No SOE Normal LAA Negative bubble study Normal RA/LA Normal RV Normal aorta with no debris  Charlton Haws

## 2013-05-10 ENCOUNTER — Inpatient Hospital Stay (HOSPITAL_COMMUNITY): Payer: Medicaid Other

## 2013-05-10 LAB — CBC
Platelets: 373 10*3/uL (ref 150–400)
RBC: 4.11 MIL/uL (ref 3.87–5.11)
RDW: 13 % (ref 11.5–15.5)
WBC: 13 10*3/uL — ABNORMAL HIGH (ref 4.0–10.5)

## 2013-05-10 LAB — GLUCOSE, CAPILLARY
Glucose-Capillary: 194 mg/dL — ABNORMAL HIGH (ref 70–99)
Glucose-Capillary: 188 mg/dL — ABNORMAL HIGH (ref 70–99)
Glucose-Capillary: 176 mg/dL — ABNORMAL HIGH (ref 70–99)
Glucose-Capillary: 152 mg/dL — ABNORMAL HIGH (ref 70–99)
Glucose-Capillary: 147 mg/dL — ABNORMAL HIGH (ref 70–99)
Glucose-Capillary: 155 mg/dL — ABNORMAL HIGH (ref 70–99)
Glucose-Capillary: 127 mg/dL — ABNORMAL HIGH (ref 70–99)
Glucose-Capillary: 120 mg/dL — ABNORMAL HIGH (ref 70–99)
Glucose-Capillary: 114 mg/dL — ABNORMAL HIGH (ref 70–99)
Glucose-Capillary: 116 mg/dL — ABNORMAL HIGH (ref 70–99)
Glucose-Capillary: 123 mg/dL — ABNORMAL HIGH (ref 70–99)
Glucose-Capillary: 114 mg/dL — ABNORMAL HIGH (ref 70–99)
Glucose-Capillary: 151 mg/dL — ABNORMAL HIGH (ref 70–99)

## 2013-05-10 LAB — PROTEIN ELECTROPH W RFLX QUANT IMMUNOGLOBULINS
Albumin ELP: 41.8 % — ABNORMAL LOW (ref 55.8–66.1)
Alpha-2-Globulin: 22.5 % — ABNORMAL HIGH (ref 7.1–11.8)
Beta Globulin: 5.9 % (ref 4.7–7.2)
M-Spike, %: NOT DETECTED g/dL
Total Protein ELP: 4.7 g/dL — ABNORMAL LOW (ref 6.0–8.3)

## 2013-05-10 LAB — COMPREHENSIVE METABOLIC PANEL
ALT: 21 U/L (ref 0–35)
AST: 23 U/L (ref 0–37)
Albumin: 1.9 g/dL — ABNORMAL LOW (ref 3.5–5.2)
Alkaline Phosphatase: 88 U/L (ref 39–117)
Chloride: 113 mEq/L — ABNORMAL HIGH (ref 96–112)
Potassium: 3.7 mEq/L (ref 3.5–5.1)
Sodium: 148 mEq/L — ABNORMAL HIGH (ref 135–145)
Total Bilirubin: 0.2 mg/dL — ABNORMAL LOW (ref 0.3–1.2)

## 2013-05-10 LAB — IGG, IGA, IGM
IgA: 290 mg/dL (ref 69–380)
IgG (Immunoglobin G), Serum: 689 mg/dL — ABNORMAL LOW (ref 690–1700)

## 2013-05-10 MED ORDER — VITAL AF 1.2 CAL PO LIQD
1000.0000 mL | ORAL | Status: DC
  Administered 2013-05-10: 711 mL
  Administered 2013-05-10 – 2013-05-12 (×3): 1000 mL
  Filled 2013-05-10 (×5): qty 1000

## 2013-05-10 MED ORDER — FREE WATER
200.0000 mL | Freq: Four times a day (QID) | Status: DC
  Administered 2013-05-10 – 2013-05-15 (×20): 200 mL

## 2013-05-10 MED ORDER — PHENYTOIN 125 MG/5ML PO SUSP
100.0000 mg | Freq: Three times a day (TID) | ORAL | Status: DC
  Administered 2013-05-10 – 2013-05-11 (×3): 100 mg via ORAL
  Filled 2013-05-10 (×5): qty 4

## 2013-05-10 MED ORDER — INSULIN ASPART 100 UNIT/ML ~~LOC~~ SOLN
2.0000 [IU] | SUBCUTANEOUS | Status: DC
  Administered 2013-05-10 (×2): 6 [IU] via SUBCUTANEOUS

## 2013-05-10 NOTE — Progress Notes (Signed)
Nursing 0900 Dr. Vassie Loll aware of patient's UOP and CVP, no further orders at this time.

## 2013-05-10 NOTE — Progress Notes (Signed)
PULMONARY  / CRITICAL CARE MEDICINE  Name: Brenda Pope MRN: [id number] DOB: 1956/05/11    ADMISSION DATE:  05/02/2013 CONSULTATION DATE:  7/7/104  REFERRING MD :  Transfer from [place]:  PCCM  CHIEF COMPLAINT:  Status epilepticus  BRIEF PATIENT DESCRIPTION: 57 yo with past medical history of CVAs / TIAs and diastolic heart failure, severe baseline hypertension with noncompliance, poorly controlled diabetes and general this like a medical care. She was admitted to [facility] on 7/5 with acute dyspnea and suspected CHF exacerbation.  Later on the day of admission developed developed neurological findings consistent with acute CVA.  On 7/6 intubated for airway protection during suspected seizure activity.  Hypotensive post intubation requiring vasopressors. MRI confirmed small acute infarcts of the right parietal lobe. On 7/7 transferred to [facility] for further management.  SIGNIFICANT EVENTS / STUDIES:  7/5  Admitted to [place] [facility] with CHF exacerbation; stroke symptoms 7/6  Suspected seizure activity, intubated for airway protection; hypotensive post intubation requiring vasopressors 7/6  Brain MRI:  Multiple small acute infarcts R parietal lobe, possible small infarcts left basal ganglia and central pons 7/6  TTE:  No source of CVA, EF 55-60% 7/6  Carotid Doppler:  No evidence of hemodynamically significant stenosis 7/7  Transferred to [facility], PCCM serivce 7/7 Neuro consult: "Concern patient is on SE in the context of recent cortical stroke." 7/8 MRI brain: Confluent and scattered right MCA and right PCA acute infarcts, plus scattered small left hemisphere and occasional posterior fossa acute infarcts. The vast majority of these infarcts are newly seen since 05/01/2013.  This appearance suggests embolic phenomena which severely affected more proximal/medium-sized vessels of the right MCA and PCA territories. Cytotoxic edema without significant mass effect at this  time. No associated hemorrhage 7/8 EEG: severe diffuse encephalopathic process with asymmetrical involvement. Findings reported from the left hemisphere were nonspecific. Findings record from the right hemisphere were consistent with acute large area of infarction. No frank epileptiform activity was recorded. 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  7/10 LP performed by Neuro:  7/10 Burst suppression with propofol. Norepi initiated to prevent propofol induced hypotension. Consideration of primary CNS vasculitis. Serologies ordered>> CRP, ESR markedly elevated. High dose methylpred ordered. Insulin gtt ordered while on high dose steriods 7/10 propofol ineffective at burst suppression. pentobarbitol initiated 7/11 CT head:  7/11 angiogram eval for vasculitis:   LINES / TUBES: ETT 7/6 >>  R IJ CVL 7/6 >>  L radial A-line 7/10 >> 05/07/13   CULTURES: MRSA PCR 7/07 >> POS Blood culture >>ng CSF 7/10 >> ng  AUTOIMMUNE  rheumatoid factor, ANCA, ANA is negative   ANTIBIOTICS: Anti-infectives   None     Solumedrol 1gm for autoimmune  >>  SUBJ: Afebrile  Remains comatose. Phenobarb dc'ed on 7/13 On low dose levophed On insulin gtt  VITAL SIGNS: Temp:  [96.2 F (35.7 C)-97.9 F (36.6 C)] 96.2 F (35.7 C) (07/15 0800) Pulse Rate:  [50-58] 57 (07/15 0823) Resp:  [11-20] 15 (07/15 0823) BP: (90-149)/(45-65) 118/51 mmHg (07/15 0823) SpO2:  [92 %-100 %] 96 % (07/15 0823) FiO2 (%):  [29.9 %-30.5 %] 30 % (07/15 0823)  HEMODYNAMICS: CVP:  [3 mmHg-9 mmHg] 6 mmHg VENTILATOR SETTINGS: Vent Mode:  [-] PRVC FiO2 (%):  [29.9 %-30.5 %] 30 % Set Rate:  [14 bmp] 14 bmp Vt Set:  [500  mL] 500 mL PEEP:  [5 cmH20] 5 cmH20 Plateau Pressure:  [16 cmH20-19 cmH20] 17 cmH20  INTAKE / OUTPUT: Intake/Output     07/14 0701 - 07/15 0700 07/15  0701 - 07/16 0700   I.V. (mL/kg) 947 (8.2) 33.9 (0.3)   Other 1000    NG/GT 362.2 10   IV Piggyback 523    Total Intake(mL/kg) 2832.2 (24.6) 43.9 (0.4)   Urine (mL/kg/hr) 1200 (0.4) 39 (0.2)   Emesis/NG output     Total Output 1200 39   Net +1632.2 +4.9          PHYSICAL EXAMINATION: General: comatose, NAD. Off phenobarbital. On no sedation Neuro: no spont movement, no w/d from pain, starting to overbreathe on vent, gag + HEENT: PERRL, EOMI Cardiovascular:  RRR s M Lungs: clear Abdomen:  Soft, nontender, bowel sounds diminished, TFs on hold sue to high residuals Musculoskeletal: warm, no edema  LABS: See individual A and P  IMAGING x 48h Dg Chest Port 1 View  05/10/2013   *RADIOLOGY REPORT*  Clinical Data: 57 year old female with respiratory failure. Intubated.  PORTABLE CHEST - 1 VIEW  Comparison: 05/08/2013 and earlier.  Findings: AP portable semi upright view of the chest at 0547 hours. The patient is more rotated to the right.  Endotracheal tube tip is stable at the level of the clavicles.  Enteric tube courses to the abdomen and appears stable, looping in the gastric cardia region. Right IJ approach central line is stable.  Increased EKG leads and wires overlie the chest.  Bilateral veiling pulmonary opacities compatible with pleural effusions right greater than left.  These appear increased.  Increased retrocardiac atelectasis or consolidation.  Stable cardiac size and mediastinal contours.  No pneumothorax.  Continued pulmonary vascular congestion, not significantly changed.  IMPRESSION: 1.  Rotated. Stable lines and tubes. 2.  Increased appearance of bilateral pleural effusions and lower lobe collapse / consolidation. 3.  Stable vascular congestion.   Original Report Authenticated By: Roselyn Reef, M.D.     ASSESSMENT / PLAN:  PULMONARY No results found for this basename: PHART, PCO2, PCO2ART, PO2, PO2ART, HCO3, TCO2, O2SAT,  in the last 168 hours  A:  Acute respiratory  failure due to AMS  P:   Cont vent support - while in coma Vent bundle   CARDIOVASCULAR    Recent Labs Lab 05/04/13 0540  TROPONINI <0.30   No results found for this basename: PROBNP,  in the last 168 hours  A: propofol/pentobarb induced hypotension H/O CHF - compensated Chronic hypertension Lactate decreased from 3 to 1.7 TEE neg 7/14 P:  Taper levo to off, CVp low but will not treat -likely due to pentobarb   RENAL  Recent Labs Lab 05/04/13 0540 05/06/13 0450 05/07/13 0500 05/08/13 0510 05/10/13 0430  NA 143 146* 145 147* 148*  K 3.5 4.5 4.1 3.9 3.7  CL 108 111 109 108 113*  CO2 25 27 27 29 27   GLUCOSE 248* 146* 163* 166* 170*  BUN 43* 55* 56* 64* 96*  CREATININE 2.10* 1.93* 1.71* 1.78* 1.77*  CALCIUM 8.6 8.5 8.4 8.6 8.2*  MG  --   --   --  2.3  --   PHOS  --   --   --  5.7*  --    Intake/Output     07/14 0701 - 07/15 0700 07/15 0701 - 07/16 0700   I.V. (mL/kg) 947 (8.2) 33.9 (0.3)   Other 1000    NG/GT 362.2 10   IV  Piggyback 523    Total Intake(mL/kg) 2832.2 (24.6) 43.9 (0.4)   Urine (mL/kg/hr) 1200 (0.4) 39 (0.2)   Emesis/NG output     Total Output 1200 39   Net +1632.2 +4.9          A:  Acute renal failure, unclear etiology, improving P:   Monitor chemistries Correct electrolytes as indicated Increase free water 200 q 6h  GASTROINTESTINAL  Recent Labs Lab 05/10/13 0430  AST 23  ALT 21  ALKPHOS 88  BILITOT 0.2*  PROT 5.4*  ALBUMIN 1.9*    A:  05/08/13: Tube feeds held due to high residuals for second time. KUB was normal without ileus. Reglan has been started P:   Increase tube feeds to 20/h Continue Reglan Cont SUP  HEMATOLOGIC  Recent Labs Lab 05/07/13 0500 05/08/13 0510 05/10/13 0430  HGB 11.2* 13.1 12.3  HCT 34.7* 40.9 38.4  WBC 8.9 7.0 13.0*  PLT 322 344 373   No results found for this basename: INR,  in the last 168 hours  A:  Mild anemia. P:  Monitor CBC SCDs for DVT Px - PRBC for hgb </= 6.9gm%         INFECTIOUS  Recent Labs Lab 05/04/13 0540 05/06/13 0450 05/07/13 0500 05/08/13 0510 05/10/13 0430  WBC 9.5 11.6* 8.9 7.0 13.0*    Recent Labs Lab 05/07/13 0740 05/08/13 0510 05/08/13 1520 05/09/13 0230 05/09/13 0235  LATICACIDVEN  --  3.0* 1.2  --  1.7  PROCALCITON <0.10 <0.10  --  <0.10  --     A:  No overt infections ,  lactic acidosis improved from 3 to 1.7- more likely related to seizures rather than infection P:   Monitor off abx   ENDOCRINE  CBG (last 3)   Recent Labs  05/09/13 1842 05/09/13 1941 05/09/13 2044  GLUCAP 180* 153* 152*     A:  DM2. Hyperglycemia. P:   Cont insulin infusion while on high dose steroids   NEUROLOGIC A:  Acute CVA in multiple territories  - possibly embolic, TEE neg Acute encephalopathy Status epilepticus Concern for CNS vasculitis but not impressed with serology: ANCA negative, ESR CRP elevated, ssA, ssB , RF, CCP, DS-DNA,   GBM, anti-Smith  neg Borrelia IgG/IgM slight high On pentobarb coma from 05/05/13 - 05/08/13. Well burst suppressed per neuro   P:   Await Aldolase Anti-convulsants per Neuro Vasculitis w/u per Neuro Empiric steroids Brain biopsy being considered   I have personally obtained a history, examined the patient, evaluated laboratory and imaging results, formulated the assessment and plan and placed orders.   CRITICAL CARE:  The patient is critically ill with multiple organ systems failure and requires high complexity decision making for assessment and support, frequent evaluation and titration of therapies, application of advanced monitoring technologies and extensive interpretation of multiple databases. Critical Care Time devoted to patient care services described in this note is 32 minutes.    Kara Mead MD. Shade Flood. [place] [facility] Pager [phone] If no response call [phone]   05/10/2013 8:52 AM

## 2013-05-10 NOTE — Progress Notes (Signed)
UR completed 

## 2013-05-10 NOTE — Progress Notes (Signed)
Pt CBG = 266; called [facility]ink and Spoke to Dr. Delford Field & per MD orders to cover pt with 6 units.

## 2013-05-10 NOTE — Progress Notes (Signed)
Subjective: EEG no longer in burst suppression. No evidence of ongoing seizure.  Exam: Filed Vitals:   05/10/13 1600  BP:   Pulse:   Temp: 98 F (36.7 C)  Resp:    Gen: In bed, intubated Exam continues to be confounded by pentobarbital.  Mental status: Comatose CN: Pupils reactive bilaterally, no corneals.  No response to noxious stimuli No motor response.   RF normal, ESR CRP  elevated. IgG index was at the upper limit of normal.   Impression: 57 yo F with recurrent strokes and status epilepticus. She was ilekly in [place] for several days, but involving mostly the right hemisphere.  Her angiogram did show some irregular narrowing which could be consistent with vasculitis. 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  It is also possible that this represents cardioembolic disease.  Refractory status epilepticus was controlled with burst suppression, now d/ced  Recommendations: 1) received 6 days IV solumedorl, can d/c today.  2) dilantin level was theraputic, continue current dose.  3) TEE report pendign.  4) Vasculitis workup pending 5) continue other AEDs.  6) Will continue to monitor for improved mental status. If her mental status improves to the point that aggressive care would be continued,  And If no other source for cardiac emboli or findings on vasculitis workup, could consider biopsy.   [name], MD [facility] [phone]  If 7pm- 7am, please page neurology on call at [phone].

## 2013-05-10 NOTE — Procedures (Addendum)
History: 57 year old multiple embolic infarcts who had been in status epilepticus   this is a continuous EEG the record on the morning of 05/09/2013 into the morning of 05/10/2013  Sedation: None, previously on pentobarbital   Background: The beginning of the recording consists of short period of suppression intermixed with periods of delta with some sharply contoured activity interspersed. This is in the setting of waning of pentobarbital. This pattern over the course of the recording is slowly replaced with a pattern of irregular delta that is more prominent over the right hemisphere. There are also sharp waves most often seen at F4, Fp2. The symptoms did become periodic, but never with any evolution to suggest ictal discharge. There are recurrent discharges in the right frontal regions later in the recording, the become periodic, but without evolution. Present the recording there does appear to be a sleep/wake cycle though not normal sleep structures  EEG Abnormalities: 1) right frontal periodic lateralized discharges(PLEDs) 2) generalized irregular delta activity 3) recurrent suppressions seen at the onset of the recording  Clinical Interpretation: This EEG is consistent with a severe nonspecific generalized rhythm dysfunction with a focal area of superimposed dysfunction in the right frontal region. Periodic lateralized epileptiform discharges can be ictal in nature, however they are more often associated with structural lesions and there is no evolution in this case to support recurrent seizure activity.  [name], MD [facility] [phone]  If 7pm- 7am, please page neurology on call at [phone].

## 2013-05-11 ENCOUNTER — Inpatient Hospital Stay (HOSPITAL_COMMUNITY): Payer: Medicaid Other

## 2013-05-11 DIAGNOSIS — R7309 Other abnormal glucose: Secondary | ICD-10-CM

## 2013-05-11 DIAGNOSIS — E119 Type 2 diabetes mellitus without complications: Secondary | ICD-10-CM | POA: Diagnosis present

## 2013-05-11 DIAGNOSIS — R4020 Unspecified coma: Secondary | ICD-10-CM | POA: Diagnosis present

## 2013-05-11 DIAGNOSIS — R739 Hyperglycemia, unspecified: Secondary | ICD-10-CM | POA: Diagnosis present

## 2013-05-11 LAB — GLUCOSE, CAPILLARY
Glucose-Capillary: 292 mg/dL — ABNORMAL HIGH (ref 70–99)
Glucose-Capillary: 242 mg/dL — ABNORMAL HIGH (ref 70–99)
Glucose-Capillary: 213 mg/dL — ABNORMAL HIGH (ref 70–99)
Glucose-Capillary: 212 mg/dL — ABNORMAL HIGH (ref 70–99)
Glucose-Capillary: 151 mg/dL — ABNORMAL HIGH (ref 70–99)
Glucose-Capillary: 128 mg/dL — ABNORMAL HIGH (ref 70–99)
Glucose-Capillary: 159 mg/dL — ABNORMAL HIGH (ref 70–99)

## 2013-05-11 LAB — B. BURGDORFI ANTIBODIES BY WB: B burgdorferi IgG Abs (IB): NEGATIVE

## 2013-05-11 LAB — BASIC METABOLIC PANEL
BUN: 106 mg/dL — ABNORMAL HIGH (ref 6–23)
Calcium: 8.1 mg/dL — ABNORMAL LOW (ref 8.4–10.5)
Creatinine, Ser: 1.81 mg/dL — ABNORMAL HIGH (ref 0.50–1.10)
GFR calc Af Amer: 35 mL/min — ABNORMAL LOW (ref >90)
GFR calc non Af Amer: 30 mL/min — ABNORMAL LOW (ref >90)
Glucose, Bld: 341 mg/dL — ABNORMAL HIGH (ref 70–99)

## 2013-05-11 LAB — CBC
Hemoglobin: 12.3 g/dL (ref 12.0–15.0)
MCH: 30.9 pg (ref 26.0–34.0)
MCHC: 33 g/dL (ref 30.0–36.0)
RDW: 12.9 % (ref 11.5–15.5)

## 2013-05-11 LAB — CULTURE, BLOOD (ROUTINE X 2): Culture: NO GROWTH

## 2013-05-11 MED ORDER — METOCLOPRAMIDE HCL 5 MG/5ML PO SOLN
10.0000 mg | Freq: Three times a day (TID) | ORAL | Status: DC
Start: 2013-05-11 — End: 2013-05-11
  Filled 2013-05-11 (×3): qty 10

## 2013-05-11 MED ORDER — SODIUM CHLORIDE 0.9 % IV SOLN
INTRAVENOUS | Status: DC
  Administered 2013-05-11: 2.4 [IU]/h via INTRAVENOUS
  Administered 2013-05-11 – 2013-05-12 (×2): via INTRAVENOUS
  Filled 2013-05-11 (×4): qty 1

## 2013-05-11 MED ORDER — PHENYTOIN 125 MG/5ML PO SUSP
100.0000 mg | Freq: Three times a day (TID) | ORAL | Status: DC
  Administered 2013-05-11 – 2013-05-30 (×56): 100 mg
  Filled 2013-05-11 (×61): qty 4

## 2013-05-11 MED ORDER — PANTOPRAZOLE SODIUM 40 MG PO PACK
40.0000 mg | PACK | Freq: Every day | ORAL | Status: DC
  Administered 2013-05-11 – 2013-05-31 (×21): 40 mg
  Filled 2013-05-11 (×23): qty 20

## 2013-05-11 MED ORDER — LEVETIRACETAM 100 MG/ML PO SOLN
1000.0000 mg | Freq: Two times a day (BID) | ORAL | Status: DC
  Administered 2013-05-11 – 2013-06-17 (×74): 1000 mg
  Filled 2013-05-11 (×76): qty 10

## 2013-05-11 MED ORDER — PRO-STAT SUGAR FREE PO LIQD
30.0000 mL | Freq: Every day | ORAL | Status: DC
  Administered 2013-05-11 – 2013-05-30 (×90): 30 mL
  Filled 2013-05-11 (×99): qty 30

## 2013-05-11 NOTE — Progress Notes (Signed)
Pt progessed to Phase 3 of Hyperglycemic Protocol this afternoon.  Dr. Delford Field wanted to keep her on the insulin drip because the past two attempts were unsuccessful resulting in her going back to the Hyperglycemic Protocol.

## 2013-05-11 NOTE — Progress Notes (Addendum)
NUTRITION FOLLOW UP  Intervention:    Continue Vital AF 1.2 increasing to goal rate of 45 ml/h.  Add Prostat 30 ml 5 times daily to provide a total of 1796 kcals (25 kcals/kg ideal weight), 156 gm protein, 876 ml free water daily.  Nutrition Dx:   Inadequate oral intake related to inability to eat as evidenced by NPO status; ongoing.  Goal:   Enteral nutrition to provide 60-70% of estimated calorie needs (22-25 kcals/kg ideal body weight) and 100% of estimated protein needs, based on ASPEN guidelines for permissive underfeeding in critically ill obese individuals. Progressing.  Monitor:   TF tolerance/adequacy, labs, weight trend, vent status.  Assessment:   Per Neurology, no longer in burst suppression, no evidence of ongoing seizure. Patient remains intubated on ventilator support.  MV: 8.5 Temp:Temp (24hrs), Avg:98.5 F (36.9 C), Min:96.7 F (35.9 C), Max:99.8 F (37.7 C)   Height: Ht Readings from Last 1 Encounters:  05/02/13 6' (1.829 m)    Weight Status:   Wt Readings from Last 1 Encounters:  05/07/13 254 lb 3.1 oz (115.3 kg)  05/06/13  257 lb 0.9 oz (116.6 kg)  05/02/13  238 lb 1.6 oz (108 kg)   Re-estimated needs:  Kcal: 2060 Protein: 145 gm protein Fluid: 2-2.2 L   Skin: no issues  Diet Order:  NPO  TF Order: Vital AF 1.2 at 45 ml/h to provide 1296 kcals, 81 gm protein, 876 ml free water daily. Currently running at 40 ml/h. TF had been on hold yesterday for increased residuals. Reglan has been added. 10 ml residuals documented this morning.  Free water flushes: 200 ml every 6 hours  Intake/Output Summary (Last 24 hours) at 05/11/13 1135 Last data filed at 05/11/13 1100  Gross per 24 hour  Intake 2333.64 ml  Output   1690 ml  Net 643.64 ml    Last BM: 7/16   Labs:   Recent Labs Lab 05/08/13 0510 05/10/13 0430 05/11/13 0400  NA 147* 148* 149*  K 3.9 3.7 4.0  CL 108 113* 112  CO2 29 27 24   BUN 64* 96* 106*  CREATININE 1.78* 1.77* 1.81*   CALCIUM 8.6 8.2* 8.1*  MG 2.3  --   --   PHOS 5.7*  --   --   GLUCOSE 166* 170* 341*    CBG (last 3)   Recent Labs  05/11/13 0746 05/11/13 0856 05/11/13 0954  GLUCAP 213* 212* 170*    Scheduled Meds: . antiseptic oral rinse  1 application Mouth Rinse QID  . aspirin  325 mg Per Tube Daily  . chlorhexidine  15 mL Mouth/Throat BID  . enoxaparin (LOVENOX) injection  40 mg Subcutaneous Q24H  . free water  200 mL Per Tube Q6H  . insulin aspart  2-6 Units Subcutaneous Q4H  . lacosamide (VIMPAT) IV  200 mg Intravenous Q12H  . levETIRAcetam  1,000 mg Per Tube BID  . metoCLOPramide  10 mg Per Tube Q8H  . pantoprazole sodium  40 mg Per Tube Daily  . phenytoin  100 mg Per Tube TID    Continuous Infusions: . sodium chloride 20 mL/hr at 05/11/13 1100  . feeding supplement (VITAL AF 1.2 CAL) 1,000 mL (05/11/13 1100)  . insulin (NOVOLIN-R) infusion 8.1 mL/hr at 05/11/13 1049  . norepinephrine (LEVOPHED) Adult infusion 5 mcg/min (05/11/13 1100)  . PENTobarbital (NEMBUTAL) infusion Stopped (05/08/13 0746)    Joaquin Courts, RD, LDN, CNSC Pager [phone] After Hours Pager [phone]

## 2013-05-11 NOTE — Progress Notes (Signed)
NEURO HOSPITALIST PROGRESS NOTE   SUBJECTIVE:                                                                                                                        Remains comatose, intubated on the vent. Off sedatives. Continuous EEG currently showing no evidence of electrographic seizures but diffuse continuous slowing. TEE showed no cardiac source of emboli. Vasculitis profile unrevealing so far.  Phenytoin level therapeutic 13.6 On triple AED's: vimpat, keppra, dilantin.  OBJECTIVE:                                                                                                                           Vital signs in last 24 hours: Temp:  [98 F (36.7 C)-99.8 F (37.7 C)] 98.7 F (37.1 C) (07/16 0800) Pulse Rate:  [58-85] 65 (07/16 1241) Resp:  [14-19] 17 (07/16 1241) BP: (113-169)/(39-51) 141/41 mmHg (07/16 1241) SpO2:  [93 %-98 %] 97 % (07/16 1241) FiO2 (%):  [29.8 %-30.5 %] 30 % (07/16 1241)  Intake/Output from previous day: 07/15 0701 - 07/16 0700 In: 2180.4 [I.V.:882.4; NG/GT:450; IV Piggyback:368] Out: 1654 [Urine:1654] Intake/Output this shift: Total I/O In: 942.6 [I.V.:337.6; NG/GT:480; IV Piggyback:125] Out: 470 [Urine:470] Nutritional status:    Past Medical History  Diagnosis Date  . Stroke   . Diabetes mellitus without complication   . Hypertension   . CHF (congestive heart failure)     Neurologic Exam:  Mental status: comatose. CN 2-12: pupils measure 3-4 mm bilaterally, with sluggish light reactivity. No gaze preference. Couldn't elicit a corneal response.  Motor: no spontaneous movements. Sensory: doesn't react to pain. Coordination and gait: no tested.  Lab Results: Lab Results  Component Value Date/Time   CHOL 126 05/03/2013  4:35 AM   Lipid Panel No results found for this basename: CHOL, TRIG, HDL, CHOLHDL, VLDL, LDLCALC,  in the last 72 hours  Studies/Results: Dg Chest Port 1 View  05/11/2013    *RADIOLOGY REPORT*  Clinical Data: Evaluate endotracheal tube position  PORTABLE CHEST - 1 VIEW  Comparison: Portable chest x-ray of 05/10/2013  Findings:   The tip of the endotracheal tube is approximately 2.6 cm above the carina.  Mild basilar atelectasis has improved somewhat.  Mild cardiomegaly is  stable.  The right central venous line tip overlies expected SVC - RA junction and an NG tube remains.  IMPRESSION:  1.  Slightly better aeration with mild basilar atelectasis and possible effusions. 2.  Tip of endotracheal tube 2.6 cm above the carina.   Original Report Authenticated By: Dwyane Dee, M.D.   Dg Chest Port 1 View  05/10/2013   *RADIOLOGY REPORT*  Clinical Data: 57 year old female with respiratory failure. Intubated.  PORTABLE CHEST - 1 VIEW  Comparison: 05/08/2013 and earlier.  Findings: AP portable semi upright view of the chest at 0547 hours. The patient is more rotated to the right.  Endotracheal tube tip is stable at the level of the clavicles.  Enteric tube courses to the abdomen and appears stable, looping in the gastric cardia region. Right IJ approach central line is stable.  Increased EKG leads and wires overlie the chest.  Bilateral veiling pulmonary opacities compatible with pleural effusions right greater than left.  These appear increased.  Increased retrocardiac atelectasis or consolidation.  Stable cardiac size and mediastinal contours.  No pneumothorax.  Continued pulmonary vascular congestion, not significantly changed.  IMPRESSION: 1.  Rotated. Stable lines and tubes. 2.  Increased appearance of bilateral pleural effusions and lower lobe collapse / consolidation. 3.  Stable vascular congestion.   Original Report Authenticated By: Erskine Speed, M.D.    MEDICATIONS                                                                                                                       I have reviewed the patient's current medications.  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 Leukocytosis, mild hypernatremia and hyperglycemia,  Unfortunately, she remains comatose and no clear source of patient's recurrent strokes had been identified so far. I had an ample conversation with patient's family regarding patient's neurological status and answered all their questions to the best of my knowledge. Will continue current AED's. Will continue to follow.  Wyatt Portela, MD [facility] [phone]  05/11/2013, 2:21 PM

## 2013-05-11 NOTE — Progress Notes (Signed)
PULMONARY  / CRITICAL CARE MEDICINE  Name: Brenda Pope MRN: [id number] DOB: 1955/12/06    ADMISSION DATE:  05/02/2013 CONSULTATION DATE:  7/7/104  REFERRING MD :  Transfer from [facility] PRIMARY SERVICE:  PCCM  CHIEF COMPLAINT:  Status epilepticus  BRIEF PATIENT DESCRIPTION: 57 yo with past medical history of CVAs/TIAs and diastolic heart failure, severe baseline hypertension with noncompliance, poorly controlled diabetes admitted to [facility] on 7/5 with acute dyspnea and suspected CHF exacerbation.  On the day of admission developed developed neurological findings consistent with acute CVA. On 7/6 intubated for airway protection during suspected seizure activity.  Hypotensive post intubation requiring vasopressors. MRI confirmed small acute infarcts of the right parietal lobe. On 7/7 transferred to [facility] for further management.  SIGNIFICANT EVENTS / STUDIES:  7/5  Admitted to  reg[facility] with CHF exacerbation; stroke symptoms 7/6  Suspected seizure activity, intubated for airway protection; hypotensive post intubation requiring vasopressors 7/6  Brain MRI:  Multiple small acute infarcts R parietal lobe, possible small infarcts left basal ganglia and central pons 7/6  TTE:  No source of CVA, EF 55-60% 7/6  Carotid Doppler:  No evidence of hemodynamically significant stenosis 7/7  Transferred to [facility], PCCM serivce 7/7 Neuro consult: "Concern patient is on SE in the context of recent cortical stroke." 7/8 MRI brain: Confluent and scattered right MCA and right PCA acute infarcts, plus scattered small left hemisphere and occasional posterior fossa acute infarcts. The vast majority of these infarcts are newly seen since 05/01/2013.  This appearance suggests embolic phenomena which severely affected more proximal/medium-sized vessels of the right MCA and PCA territories. Cytotoxic edema without significant mass effect at this time. No associated hemorrhage 7/8 EEG: severe diffuse  encephalopathic process with asymmetrical involvement. Findings reported from the left hemisphere were nonspecific. Findings record from the right hemisphere were consistent with acute large area of infarction. No frank epileptiform activity was recorded. 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  7/10 LP performed by Neuro:  7/10 Burst suppression with propofol. Norepi initiated to prevent propofol induced hypotension. Consideration of primary CNS vasculitis. Serologies ordered>> CRP, ESR markedly elevated. High dose methylpred ordered. Insulin gtt ordered while on high dose steriods 7/10 propofol ineffective at burst suppression. pentobarbitol initiated 7/11 CT head: Right frontal lobe, right parietal lobe, right occipital lobe and posterior right temporal lobe acute/subacute infarcts. Surrounding edema with local mass effect. Minimal petechial hemorrhage suspected particularly along the superior margin of the infarct. No gross hemorrhage noted. No midline shift 7/11 angiogram eval for vasculitis: smooth focal areas of narrowing of prox PCAs and distal pericallosal. Subcortical branches, suspicious of vasculitis. 7/14 TEE: no evidence of thrombus  LINES / TUBES: L radial A-line 7/10 >> 7/12 ETT 7/6 >>   R IJ CVL 7/6 >>    CULTURES: MRSA PCR 7/07 >> POS Blood culture >>ng CSF 7/10 >> ng    ANTIBIOTICS: None   SUBJ:  Comatose off all sedative infusions  VITAL SIGNS: Temp:  [96.7 F (35.9 C)-99.8 F (37.7 C)] 98.7 F (37.1 C) (07/16 0800) Pulse Rate:  [56-85] 65 (07/16 1100) Resp:  [14-19] 16 (07/16 1100) BP: (109-169)/(39-51) 151/39 mmHg (07/16 1100) SpO2:  [93 %-98 %] 95 % (07/16 1100) FiO2 (%):  [30 %-30.5 %] 30 % (07/16 1100)  HEMODYNAMICS: CVP:  [3 mmHg-6 mmHg] 4 mmHg VENTILATOR SETTINGS: Vent Mode:  [-]  PRVC FiO2  (%):  [30 %-30.5 %] 30 % Set Rate:  [14 bmp] 14 bmp Vt Set:  [500 mL] 500 mL PEEP:  [5 cmH20] 5 cmH20 Plateau Pressure:  [11 cmH20-17 cmH20] 13 cmH20  INTAKE / OUTPUT: Intake/Output     07/15 0701 - 07/16 0700 07/16 0701 - 07/17 0700   I.V. (mL/kg) 882.4 (7.7) 235.6 (2)   Other 480    NG/GT 450 160   IV Piggyback 368 100   Total Intake(mL/kg) 2180.4 (18.9) 495.6 (4.3)   Urine (mL/kg/hr) 1654 (0.6) 195 (0.4)   Total Output 1654 195   Net +526.4 +300.6          PHYSICAL EXAMINATION: General: comatose Neuro: no spont movement, no w/d from pain HEENT: PERRL, EOMI Cardiovascular:  RRR s M Lungs: clear anteriorly Abdomen:  Soft, nontender, bowel sounds diminished Musculoskeletal: warm, 1-2+ BUE edema, 1+ symmetric BLE edema  LABS: BMET    Component Value Date/Time   NA 149* 05/11/2013 0400   K 4.0 05/11/2013 0400   CL 112 05/11/2013 0400   CO2 24 05/11/2013 0400   GLUCOSE 341* 05/11/2013 0400   BUN 106* 05/11/2013 0400   CREATININE 1.81* 05/11/2013 0400   CALCIUM 8.1* 05/11/2013 0400   GFRNONAA 30* 05/11/2013 0400   GFRAA 35* 05/11/2013 0400    CBC    Component Value Date/Time   WBC 20.1* 05/11/2013 0400   RBC 3.98 05/11/2013 0400   HGB 12.3 05/11/2013 0400   HCT 37.3 05/11/2013 0400   PLT 403* 05/11/2013 0400   MCV 93.7 05/11/2013 0400   MCH 30.9 05/11/2013 0400   MCHC 33.0 05/11/2013 0400   RDW 12.9 05/11/2013 0400   LYMPHSABS 1.2 05/02/2013 2200   MONOABS 1.2* 05/02/2013 2200   EOSABS 0.1 05/02/2013 2200   BASOSABS 0.0 05/02/2013 2200      CXR: NACPD    ASSESSMENT / PLAN:  PULMONARY A:  Acute respiratory failure due to AMS  P:   Cont full vent support Cont vent bundle   CARDIOVASCULAR   A: propofol/pentobarb induced hypotension, resolved H/O CHF - compensated Chronic hypertension P:  Wean NE to off for new MAP goal of 60 mmHg   RENAL A:  Acute on CRI, nonoliguric  P:   Monitor chemistries Correct electrolytes as indicated   GASTROINTESTINAL A:  No  issues P:   Cont TFs D/C metoclopramide as it can lower seizure threshold   HEMATOLOGIC A:  Mild anemia. P:  Monitor CBC intermittently On LMWH for DVT prophy    INFECTIOUS A:  No overt infections P:   Micro and abx as above   ENDOCRINE  A:  DM2. Hyperglycemia. 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   CRITICAL CARE:  The patient is critically ill with multiple organ systems failure and requires high complexity decision making for assessment and support, frequent evaluation and titration of therapies, application of advanced monitoring technologies and extensive interpretation of multiple databases. Critical Care Time devoted to patient care services described in this note is 30 minutes.    Billy Fischer, MD ; [facility] [phone].  After 5:30 PM or weekends, call [phone]

## 2013-05-12 LAB — BASIC METABOLIC PANEL
BUN: 109 mg/dL — ABNORMAL HIGH (ref 6–23)
Chloride: 116 mEq/L — ABNORMAL HIGH (ref 96–112)
Creatinine, Ser: 1.44 mg/dL — ABNORMAL HIGH (ref 0.50–1.10)
GFR calc Af Amer: 46 mL/min — ABNORMAL LOW (ref >90)
GFR calc non Af Amer: 39 mL/min — ABNORMAL LOW (ref >90)
Potassium: 3.6 mEq/L (ref 3.5–5.1)

## 2013-05-12 LAB — GLUCOSE, CAPILLARY
Glucose-Capillary: 166 mg/dL — ABNORMAL HIGH (ref 70–99)
Glucose-Capillary: 174 mg/dL — ABNORMAL HIGH (ref 70–99)
Glucose-Capillary: 186 mg/dL — ABNORMAL HIGH (ref 70–99)
Glucose-Capillary: 195 mg/dL — ABNORMAL HIGH (ref 70–99)
Glucose-Capillary: 140 mg/dL — ABNORMAL HIGH (ref 70–99)
Glucose-Capillary: 149 mg/dL — ABNORMAL HIGH (ref 70–99)
Glucose-Capillary: 152 mg/dL — ABNORMAL HIGH (ref 70–99)
Glucose-Capillary: 155 mg/dL — ABNORMAL HIGH (ref 70–99)
Glucose-Capillary: 146 mg/dL — ABNORMAL HIGH (ref 70–99)

## 2013-05-12 LAB — TROPONIN I: Troponin I: <0.3 ng/mL (ref <0.30)

## 2013-05-12 LAB — PHOSPHORUS: Phosphorus: 3.6 mg/dL (ref 2.3–4.6)

## 2013-05-12 MED ORDER — INSULIN GLARGINE 100 UNIT/ML ~~LOC~~ SOLN
40.0000 [IU] | SUBCUTANEOUS | Status: DC
  Administered 2013-05-12: 40 [IU] via SUBCUTANEOUS
  Filled 2013-05-12: qty 0.4

## 2013-05-12 MED ORDER — INSULIN GLARGINE 100 UNIT/ML ~~LOC~~ SOLN
40.0000 [IU] | Freq: Every day | SUBCUTANEOUS | Status: DC
  Filled 2013-05-12: qty 0.4

## 2013-05-12 MED ORDER — INSULIN GLARGINE 100 UNIT/ML ~~LOC~~ SOLN
40.0000 [IU] | Freq: Every day | SUBCUTANEOUS | Status: DC
  Administered 2013-05-13 – 2013-05-15 (×3): 40 [IU] via SUBCUTANEOUS
  Filled 2013-05-12 (×5): qty 0.4

## 2013-05-12 MED ORDER — DEXTROSE 10 % IV SOLN: INTRAVENOUS | Status: DC | PRN

## 2013-05-12 MED ORDER — INSULIN ASPART 100 UNIT/ML ~~LOC~~ SOLN
2.0000 [IU] | SUBCUTANEOUS | Status: DC
  Administered 2013-05-12 – 2013-05-13 (×4): 4 [IU] via SUBCUTANEOUS
  Administered 2013-05-13: 6 [IU] via SUBCUTANEOUS
  Administered 2013-05-13: 2 [IU] via SUBCUTANEOUS
  Administered 2013-05-13 – 2013-05-14 (×2): 4 [IU] via SUBCUTANEOUS
  Administered 2013-05-14: 2 [IU] via SUBCUTANEOUS
  Administered 2013-05-14 (×2): 4 [IU] via SUBCUTANEOUS
  Administered 2013-05-14 – 2013-05-16 (×6): 2 [IU] via SUBCUTANEOUS

## 2013-05-12 MED ORDER — INSULIN ASPART 100 UNIT/ML ~~LOC~~ SOLN
7.0000 [IU] | SUBCUTANEOUS | Status: DC
  Administered 2013-05-12 – 2013-05-16 (×21): 7 [IU] via SUBCUTANEOUS

## 2013-05-12 NOTE — Progress Notes (Signed)
PULMONARY  / CRITICAL CARE MEDICINE  Name: Brenda Pope MRN: [id number] DOB: [birth year]    ADMISSION DATE:  05/02/2013 CONSULTATION DATE:  7/7/104  REFERRING MD :  Transfer from [facility] PRIMARY SERVICE:  PCCM  CHIEF COMPLAINT:  Status epilepticus  BRIEF PATIENT DESCRIPTION: 57 yo with past medical history of CVAs/TIAs and diastolic heart failure, severe baseline hypertension with noncompliance, poorly controlled diabetes admitted to [facility] on 7/5 with acute dyspnea and suspected CHF exacerbation.  On the day of admission developed developed neurological findings consistent with acute CVA. On 7/6 intubated for airway protection during suspected seizure activity.  Hypotensive post intubation requiring vasopressors. MRI confirmed small acute infarcts of the right parietal lobe. On 7/7 transferred to [facility] for further management.  SIGNIFICANT EVENTS / STUDIES:  7/5  Admitted to [facility] with CHF exacerbation; stroke symptoms 7/6  Suspected seizure activity, intubated for airway protection; hypotensive post intubation requiring vasopressors 7/6  Brain MRI:  Multiple small acute infarcts R parietal lobe, possible small infarcts left basal ganglia and central pons 7/6  TTE:  No source of CVA, EF 55-60% 7/6  Carotid Doppler:  No evidence of hemodynamically significant stenosis 7/7  Transferred to [facility], PCCM serivce 7/7 Neuro consult: "Concern patient is on SE in the context of recent cortical stroke." 7/8 MRI brain: Confluent and scattered right MCA and right PCA acute infarcts, plus scattered small left hemisphere and occasional posterior fossa acute infarcts. The vast majority of these infarcts are newly seen since 05/01/2013.  This appearance suggests embolic phenomena which severely affected more proximal/medium-sized vessels of the right MCA and PCA territories. Cytotoxic edema without significant mass effect at this time. No associated hemorrhage 7/8 EEG: severe diffuse  encephalopathic process with asymmetrical involvement. Findings reported from the left hemisphere were nonspecific. Findings record from the right hemisphere were consistent with acute large area of infarction. No frank epileptiform activity was recorded. 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  7/10 LP performed by Neuro:  7/10 Burst suppression with propofol. Norepi initiated to prevent propofol induced hypotension. Consideration of primary CNS vasculitis. Serologies ordered>> CRP, ESR markedly elevated. High dose methylpred ordered. Insulin gtt ordered while on high dose steriods 7/10 propofol ineffective at burst suppression. pentobarbitol initiated 7/11 CT head: Right frontal lobe, right parietal lobe, right occipital lobe and posterior right temporal lobe acute/subacute infarcts. Surrounding edema with local mass effect. Minimal petechial hemorrhage suspected particularly along the superior margin of the infarct. No gross hemorrhage noted. No midline shift 7/11 angiogram eval for vasculitis: smooth focal areas of narrowing of prox PCAs and distal pericallosal. Subcortical branches, suspicious of vasculitis. 7/14 TEE: no evidence of thrombus  LINES / TUBES: L radial A-line 7/10 >> 7/12 ETT 7/6 >>   R IJ CVL 7/6 >>    CULTURES: MRSA PCR 7/07 >> POS Blood culture >>ng CSF 7/10 >> ng    ANTIBIOTICS: None   SUBJ:  Comatose off all sedative infusions Remains on insulin gtt  VITAL SIGNS: Temp:  [96.9 F (36.1 C)-98.7 F (37.1 C)] 96.9 F (36.1 C) (07/17 0800) Pulse Rate:  [62-70] 69 (07/17 0829) Resp:  [15-22] 17 (07/17 0829) BP: (115-167)/(38-97) 160/69 mmHg (07/17 0829) SpO2:  [93 %-99 %] 99 % (07/17 0829) FiO2 (%):  [29.6 %-30.7 %] 30.1 % (07/17 0829)  HEMODYNAMICS:   VENTILATOR SETTINGS: Vent Mode:  [-] PRVC  FiO2  (%):  [29.6 %-30.7 %] 30.1 % Set Rate:  [14 bmp] 14 bmp Vt Set:  [500 mL] 500 mL PEEP:  [4.8 cmH20-5 cmH20] 5 cmH20 Plateau Pressure:  [16 cmH20] 16 cmH20  INTAKE / OUTPUT: Intake/Output     07/16 0701 - 07/17 0700 07/17 0701 - 07/18 0700   I.V. (mL/kg) 803.3 (7) 81 (0.7)   Other     NG/GT 1245 135   IV Piggyback 170 25   Total Intake(mL/kg) 2218.3 (19.2) 241 (2.1)   Urine (mL/kg/hr) 2175 (0.8) 430 (0.8)   Total Output 2175 430   Net +43.3 -189        Stool Occurrence 2 x 1 x     PHYSICAL EXAMINATION: General: comatose Neuro: no spont movement, no w/d from pain, gag+ HEENT: PERRL, EOMI Cardiovascular:  RRR s M Lungs: clear anteriorly Abdomen:  Soft, nontender, bowel sounds diminished Musculoskeletal: warm, 1-2+ BUE edema, 1+ symmetric BLE edema  LABS: BMET    Component Value Date/Time   NA 151* 05/12/2013 0435   K 3.6 05/12/2013 0435   CL 116* 05/12/2013 0435   CO2 27 05/12/2013 0435   GLUCOSE 170* 05/12/2013 0435   BUN 109* 05/12/2013 0435   CREATININE 1.44* 05/12/2013 0435   CALCIUM 8.5 05/12/2013 0435   GFRNONAA 39* 05/12/2013 0435   GFRAA 46* 05/12/2013 0435    CBC    Component Value Date/Time   WBC 20.1* 05/11/2013 0400   RBC 3.98 05/11/2013 0400   HGB 12.3 05/11/2013 0400   HCT 37.3 05/11/2013 0400   PLT 403* 05/11/2013 0400   MCV 93.7 05/11/2013 0400   MCH 30.9 05/11/2013 0400   MCHC 33.0 05/11/2013 0400   RDW 12.9 05/11/2013 0400   LYMPHSABS 1.2 05/02/2013 2200   MONOABS 1.2* 05/02/2013 2200   EOSABS 0.1 05/02/2013 2200   BASOSABS 0.0 05/02/2013 2200      CXR: NACPD    ASSESSMENT / PLAN:  PULMONARY A:  Acute respiratory failure due to AMS  P:   Cont full vent support Cont vent bundle   CARDIOVASCULAR   A: propofol/pentobarb induced hypotension, resolved H/O CHF - compensated Chronic hypertension P:  Off NE    RENAL A:  Acute on CRI, nonoliguric, uremic  P:   Monitor chemistries Correct electrolytes as indicated   GASTROINTESTINAL A:  No  issues P:   Cont TFs D/C metoclopramide as it can lower seizure threshold   HEMATOLOGIC A:  Mild anemia. P:  Monitor CBC intermittently On LMWH for DVT prophy    INFECTIOUS A:  No overt infections P:   Micro and abx as above   ENDOCRINE  A:  DM2. Hyperglycemia. P:   Transition from insulin gtt to phase 3 once insulin rate <4, now that she is off steroids  NEUROLOGIC A:  Acute CVA in multiple territories, unclear etiology Acute encephalopathy, coma Status epilepticus Concern for CNS vasculitis-received pulse dose steroids P:   Mgmt per Neuro   I have personally obtained a history, examined the patient, evaluated laboratory and imaging results, formulated the assessment and plan and placed orders.   CRITICAL CARE:  The patient is critically ill with multiple organ systems failure and requires high complexity decision making for assessment and support, frequent evaluation and titration of therapies, application of advanced monitoring technologies and extensive interpretation of multiple databases. Critical Care Time devoted to patient care services described in this note is 31 minutes.    Cyril Mourning MD. Tonny Bollman. [facility] Pager [phone] If no response call [phone]

## 2013-05-12 NOTE — Progress Notes (Addendum)
eLink Physician-Brief Progress Note Patient Name: Brenda Pope DOB: 12-Mar-1956 MRN: [id number]  Date of Service  05/12/2013   HPI/Events of Note   RNn noticeds rhtyym change  eICU Interventions  ? Afib. Check 12 lead EKG  1:38 AM EKG HR 64 and slow   ? A flutter Plan  - troponin x1 - check mag and phos    Intervention Category Intermediate Interventions: Arrhythmia - evaluation and management  [name] 05/12/2013, 1:15 AM

## 2013-05-12 NOTE — Progress Notes (Signed)
LTVM discontinued. 

## 2013-05-12 NOTE — Progress Notes (Signed)
NEURO HOSPITALIST PROGRESS NOTE   SUBJECTIVE:                                                                                                                        Remains intubated, neurologically unchanged. Continuous EEG d/c today, with preliminary review showing no evidence of electrographic seizures. On triple AED's: vimpat, keppra, dilantin.   OBJECTIVE:                                                                                                                           Vital signs in last 24 hours: Temp:  [96.9 F (36.1 C)-97.9 F (36.6 C)] 97 F (36.1 C) (07/17 1200) Pulse Rate:  [62-74] 67 (07/17 1600) Resp:  [12-22] 17 (07/17 1600) BP: (134-167)/(38-69) 166/51 mmHg (07/17 1600) SpO2:  [95 %-100 %] 98 % (07/17 1600) FiO2 (%):  [29.6 %-30.7 %] 30.3 % (07/17 1600)  Intake/Output from previous day: 07/16 0701 - 07/17 0700 In: 2218.3 [I.V.:803.3; GE/XB:2841; IV Piggyback:170] Out: 2175 [Urine:2175] Intake/Output this shift: Total I/O In: 849.7 [I.V.:219.7; NG/GT:605; IV Piggyback:25] Out: 1170 [Urine:1170] Nutritional status:    Past Medical History  Diagnosis Date  . Stroke   . Diabetes mellitus without complication   . Hypertension   . CHF (congestive heart failure)     Neurologic Exam:  Mental status: comatose.  CN 2-12: pupils measure 3-4 mm bilaterally, with sluggish light reactivity. No gaze preference. Couldn't elicit a corneal response in the left but present in the right.  Motor: no spontaneous movements.  Sensory: doesn't react to pain.  Coordination and gait: no tested   Lab Results: Lab Results  Component Value Date/Time   CHOL 126 05/03/2013  4:35 AM   Lipid Panel No results found for this basename: CHOL, TRIG, HDL, CHOLHDL, VLDL, LDLCALC,  in the last 72 hours  Studies/Results: Dg Chest Port 1 View  05/11/2013   *RADIOLOGY REPORT*  Clinical Data: Evaluate endotracheal tube position  PORTABLE CHEST -  1 VIEW  Comparison: Portable chest x-ray of 05/10/2013  Findings:   The tip of the endotracheal tube is approximately 2.6 cm above the carina.  Mild basilar atelectasis has improved somewhat.  Mild cardiomegaly is stable.  The right central venous line tip  overlies expected SVC - RA junction and an NG tube remains.  IMPRESSION:  1.  Slightly better aeration with mild basilar atelectasis and possible effusions. 2.  Tip of endotracheal tube 2.6 cm above the carina.   Original Report Authenticated By: Dwyane Dee, M.D.    MEDICATIONS                                                                                                                       I have reviewed the patient's current medications.  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 Will continue current AED's. Will follow up  Wyatt Portela, MD [facility] [phone]  05/12/2013, 5:17 PM

## 2013-05-12 NOTE — Progress Notes (Signed)
MD notified that pt may have had a rhythm change. EKG was ordered. I will continue to monitor.

## 2013-05-12 NOTE — Progress Notes (Signed)
Inpatient Diabetes Program Recommendations  AACE/ADA: New Consensus Statement on Inpatient Glycemic Control (2013)  Target Ranges:  Prepandial:   less than 140 mg/dL      Peak postprandial:   less than 180 mg/dL (1-2 hours)      Critically ill patients:  140 - 180 mg/dL   Glucose values trending down following discontinuation of IV Decadron. Observing pt for transition to phase 3 of ICU protocol.  Pt will need 6 consecutive cbg's < 180 mg/dL and preferably as well as 6 consecutive drip rates </= 4 units per hour. Pt status is nearing these criteria for Phase 3 of protocol. Please follow the Phase 3 order set using scales based on drip rate and GFR .  Please call if there are any concerns or questions. Thank you, Lenor Coffin, RN, Diabetes CNS [phone])

## 2013-05-13 ENCOUNTER — Inpatient Hospital Stay (HOSPITAL_COMMUNITY): Payer: Medicaid Other

## 2013-05-13 DIAGNOSIS — R4020 Unspecified coma: Secondary | ICD-10-CM

## 2013-05-13 DIAGNOSIS — I6350 Cerebral infarction due to unspecified occlusion or stenosis of unspecified cerebral artery: Secondary | ICD-10-CM

## 2013-05-13 DIAGNOSIS — R569 Unspecified convulsions: Secondary | ICD-10-CM

## 2013-05-13 LAB — CBC
MCH: 30.1 pg (ref 26.0–34.0)
MCHC: 32 g/dL (ref 30.0–36.0)
MCV: 94 fL (ref 78.0–100.0)
Platelets: 251 10*3/uL (ref 150–400)
RBC: 3.52 MIL/uL — ABNORMAL LOW (ref 3.87–5.11)
RDW: 12.9 % (ref 11.5–15.5)

## 2013-05-13 LAB — GLUCOSE, CAPILLARY
Glucose-Capillary: 158 mg/dL — ABNORMAL HIGH (ref 70–99)
Glucose-Capillary: 165 mg/dL — ABNORMAL HIGH (ref 70–99)
Glucose-Capillary: 204 mg/dL — ABNORMAL HIGH (ref 70–99)

## 2013-05-13 LAB — BASIC METABOLIC PANEL
CO2: 29 mEq/L (ref 19–32)
Calcium: 8.4 mg/dL (ref 8.4–10.5)
Creatinine, Ser: 0.99 mg/dL (ref 0.50–1.10)
GFR calc non Af Amer: 62 mL/min — ABNORMAL LOW (ref >90)
Glucose, Bld: 183 mg/dL — ABNORMAL HIGH (ref 70–99)
Sodium: 153 mEq/L — ABNORMAL HIGH (ref 135–145)

## 2013-05-13 MED ORDER — FUROSEMIDE 10 MG/ML IJ SOLN
40.0000 mg | Freq: Once | INTRAMUSCULAR | Status: AC
  Administered 2013-05-13: 40 mg via INTRAVENOUS
  Filled 2013-05-13: qty 4

## 2013-05-13 MED ORDER — VITAL AF 1.2 CAL PO LIQD
1000.0000 mL | ORAL | Status: DC
  Administered 2013-05-13 – 2013-05-29 (×10): 1000 mL
  Filled 2013-05-13 (×20): qty 1000

## 2013-05-13 NOTE — Clinical Documentation Improvement (Signed)
THIS DOCUMENT IS NOT A PERMANENT PART OF THE MEDICAL RECORD  Please update your documentation with the medical record to reflect your response to this query. If you need help knowing how to do this please call [phone].  05/13/13   Dear Dr. Howell Rucks,  In a better effort to capture your patient's severity of illness, reflect appropriate length of stay and utilization of resources, a review of the patient medical record has revealed the following indicators.    Based on your clinical judgment, please clarify and document in a progress note and/or discharge summary the clinical condition associated with the following supporting information: In the Progress Note from 05/13/13, you documented "Acute on CRI, nonoliguric, uremic" in the Renal section under the Assessment/Plan.    In responding to this query please exercise your independent judgment.  The fact that a query is asked, does not imply that any particular answer is desired or expected.  Possible Clinical Conditions?   _______CKD Stage I -  GFR > OR = 90 _______CKD Stage II - GFR 60-80 _______CKD Stage III - GFR 30-59 _______CKD Stage IV - GFR 15-29 _______CKD Stage V - GFR < 15 _______ESRD (End Stage Renal Disease) _______Cannot Clinically determine   Lab:  Creatinine level (baseline and current): (don't have any further labs before admission to hospital on file) 05/02/13  Creatinine 2.81 mg/dL 0/98/11 Creatinine 9.14 mg/dL  Calculated GFR: (don't have any labs before admission to hospital on file) 05/02/13   GFR  18 mL/min 05/13/13 GFR  62 mL/min    You may use possible, probable, or suspect with inpatient documentation.  Possible, probable, suspected diagnoses MUST be documented at the time of discharge.  Reviewed:  no additional documentation provided -no CKD   Thank You,  Darla Lesches, RN, BSN, CCRN Clinical Documentation Specialist Office [phone]  [facility] department--[place]

## 2013-05-13 NOTE — Procedures (Signed)
ELECTROENCEPHALOGRAM REPORT   Patient: Brenda Pope       Room #: 9562  Age: 57 y.o.        Sex: female Referring Physician: Vassie Loll Report Date:  05/13/2013        Interpreting Physician: Thana Farr D  History: [name] is an 57 y.o. 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 antiseptic oral rinse  1 application Mouth Rinse QID  . aspirin  325 mg Per Tube Daily  . chlorhexidine  15 mL Mouth/Throat BID  . enoxaparin (LOVENOX) injection  40 mg Subcutaneous Q24H  . feeding supplement  30 mL Per Tube 5 X Daily  . free water  200 mL Per Tube Q6H  . insulin aspart  2-6 Units Subcutaneous Q4H  . insulin aspart  7 Units Subcutaneous Q4H  . insulin glargine  40 Units Subcutaneous QHS  . lacosamide (VIMPAT) IV  200 mg Intravenous Q12H  . levETIRAcetam  1,000 mg Per Tube BID  . pantoprazole sodium  40 mg Per Tube Daily  . phenytoin  100 mg Per Tube TID    Conditions of Recording:  This is a continuous 16 channel EEG accompanied by video monitoring carried out with the patient in the unresponsive and intubated state from 05/12/2013 at 9:16am to 05/12/2013 at 4:13pm.  Description:  Artifact dominates the record often and obscures the background activity.  When able to be visualized the background activity consists of a diffusely distributed low voltage polymorphic delta activity.  This activity is continuous.  There are occasional periods when there is noted some superimposed theta activity.    No normal sleep activity is noted.  No epileptiform activity is noted.     IMPRESSION: This is an abnormal recording secondary to general background slowing.  This finding is consistent with the patient's clinical history of recent NCSE and may represent a post-ictal phenomenon.  No further epileptiform activity is noted as seen on previous recordings.   Thana Farr, MD [facility] [phone] 05/13/2013, 1:19 AM

## 2013-05-13 NOTE — Progress Notes (Signed)
PULMONARY  / CRITICAL CARE MEDICINE  Name: Brenda Pope MRN: [id number] DOB: 01-Feb-1956    ADMISSION DATE:  05/02/2013 CONSULTATION DATE:  7/7/104  REFERRING MD :  Transfer from [facility] PRIMARY SERVICE:  PCCM  CHIEF COMPLAINT:  Status epilepticus  BRIEF PATIENT DESCRIPTION: 57 yo with past medical history of CVAs/TIAs and diastolic heart failure, severe baseline hypertension with noncompliance, poorly controlled diabetes admitted to [facility] on 7/5 with acute dyspnea and suspected CHF exacerbation.  On the day of admission developed developed neurological findings consistent with acute CVA. On 7/6 intubated for airway protection during suspected seizure activity.  Hypotensive post intubation requiring vasopressors. MRI confirmed small acute infarcts of the right parietal lobe. On 7/7 transferred to [facility] for further management.  SIGNIFICANT EVENTS / STUDIES:  7/5  Admitted to [facility] with CHF exacerbation; stroke symptoms 7/6  Suspected seizure activity, intubated for airway protection; hypotensive post intubation requiring vasopressors 7/6  Brain MRI:  Multiple small acute infarcts R parietal lobe, possible small infarcts left basal ganglia and central pons 7/6  TTE:  No source of CVA, EF 55-60% 7/6  Carotid Doppler:  No evidence of hemodynamically significant stenosis 7/7  Transferred to [facility], PCCM serivce 7/7 Neuro consult: "Concern patient is on SE in the context of recent cortical stroke." 7/8 MRI brain: Confluent and scattered right MCA and right PCA acute infarcts, plus scattered small left hemisphere and occasional posterior fossa acute infarcts. The vast majority of these infarcts are newly seen since 05/01/2013.  This appearance suggests embolic phenomena which severely affected more proximal/medium-sized vessels of the right MCA and PCA territories. Cytotoxic edema without significant mass effect at this time. No associated hemorrhage 7/8 EEG: severe diffuse  encephalopathic process with asymmetrical involvement. Findings reported from the left hemisphere were nonspecific. Findings record from the right hemisphere were consistent with acute large area of infarction. No frank epileptiform activity was recorded. 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  7/10 LP performed by Neuro:  7/10 Burst suppression with propofol. Norepi initiated to prevent propofol induced hypotension. Consideration of primary CNS vasculitis. Serologies ordered>> CRP, ESR markedly elevated. High dose methylpred ordered. Insulin gtt ordered while on high dose steriods 7/10 propofol ineffective at burst suppression. pentobarbitol initiated 7/11 CT head: Right frontal lobe, right parietal lobe, right occipital lobe and posterior right temporal lobe acute/subacute infarcts. Surrounding edema with local mass effect. Minimal petechial hemorrhage suspected particularly along the superior margin of the infarct. No gross hemorrhage noted. No midline shift 7/11 angiogram eval for vasculitis: smooth focal areas of narrowing of prox PCAs and distal pericallosal. Subcortical branches, suspicious of vasculitis. 7/14 TEE: no evidence of thrombus  LINES / TUBES: L radial A-line 7/10 >> 7/12 ETT 7/6 >>   R IJ CVL 7/6 >>    CULTURES: MRSA PCR 7/07 >> POS Blood culture >>ng CSF 7/10 >> ng    ANTIBIOTICS: None  SUBJ:  Comatose off all sedative infusions x 5ds Off insulin gtt  VITAL SIGNS: Temp:  [97 F (36.1 C)-98.5 F (36.9 C)] 98.4 F (36.9 C) (07/18 0400) Pulse Rate:  [62-84] 84 (07/18 0845) Resp:  [12-21] 19 (07/18 0845) BP: (144-173)/(45-63) 171/51 mmHg (07/18 0845) SpO2:  [95 %-100 %] 97 % (07/18 0845) FiO2 (%):  [29.6 %-30.3 %] 30 % (07/18 0845)  HEMODYNAMICS:   VENTILATOR SETTINGS: Vent Mode:  [-] PRVC  FiO2 (%):   [29.6 %-30.3 %] 30 % Set Rate:  [14 bmp] 14 bmp Vt Set:  [500 mL] 500 mL PEEP:  [4.9 cmH20-5 cmH20] 5 cmH20 Plateau Pressure:  [14 cmH20-16 cmH20] 15 cmH20  INTAKE / OUTPUT: Intake/Output     07/17 0701 - 07/18 0700 07/18 0701 - 07/19 0700   I.V. (mL/kg) 737.3 (6.4) 20 (0.2)   NG/GT 1679.6 45   IV Piggyback 25    Total Intake(mL/kg) 2441.9 (21.2) 65 (0.6)   Urine (mL/kg/hr) 2760 (1) 400 (1.6)   Total Output 2760 400   Net -318.2 -335        Stool Occurrence 1 x      PHYSICAL EXAMINATION: General: comatose Neuro: no spont movement, no w/d from pain, gag+ HEENT: PERRL, EOMI Cardiovascular:  RRR s M Lungs: clear anteriorly Abdomen:  Soft, nontender, bowel sounds diminished Musculoskeletal: warm, 1-2+ BUE edema, 1+ symmetric BLE edema  LABS: BMET    Component Value Date/Time   NA 153* 05/13/2013 0323   K 3.5 05/13/2013 0323   CL 119* 05/13/2013 0323   CO2 29 05/13/2013 0323   GLUCOSE 183* 05/13/2013 0323   BUN 85* 05/13/2013 0323   CREATININE 0.99 05/13/2013 0323   CALCIUM 8.4 05/13/2013 0323   GFRNONAA 62* 05/13/2013 0323   GFRAA 72* 05/13/2013 0323    CBC    Component Value Date/Time   WBC 17.8* 05/13/2013 0323   RBC 3.52* 05/13/2013 0323   HGB 10.6* 05/13/2013 0323   HCT 33.1* 05/13/2013 0323   PLT 251 05/13/2013 0323   MCV 94.0 05/13/2013 0323   MCH 30.1 05/13/2013 0323   MCHC 32.0 05/13/2013 0323   RDW 12.9 05/13/2013 0323   LYMPHSABS 1.2 05/02/2013 2200   MONOABS 1.2* 05/02/2013 2200   EOSABS 0.1 05/02/2013 2200   BASOSABS 0.0 05/02/2013 2200      CXR: NACPD    ASSESSMENT / PLAN:  PULMONARY A:  Acute respiratory failure due to AMS  P:   SBTs but no extubation Cont vent bundle   CARDIOVASCULAR   A: propofol/pentobarb induced hypotension, resolved H/O CHF - compensated Chronic hypertension P:  Off NE    RENAL A:  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 P:  Monitor CBC intermittently On LMWH for DVT prophy    INFECTIOUS A:  No overt infections P:   Micro and abx as above   ENDOCRINE  A:  DM2. Hyperglycemia. 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   CRITICAL CARE:  The patient is critically ill with multiple organ systems failure and requires high complexity decision making for assessment and support, frequent evaluation and titration of therapies, application of advanced monitoring technologies and extensive interpretation of multiple databases. Critical Care Time devoted to patient care services described in  this note is 31 minutes.    Cyril Mourning MD. Tonny Bollman. [place] [facility] Pager [phone] If no response call [phone]

## 2013-05-13 NOTE — Progress Notes (Addendum)
Family meeting arranged for Monday morning, 0830am.  Location TBD as the 3300 conference room is not available.  Both Dr. Leroy Kennedy (Neurology) and Dr. Sung Amabile (CCM) are planned to attend.   UR completed.

## 2013-05-14 ENCOUNTER — Inpatient Hospital Stay (HOSPITAL_COMMUNITY): Payer: Medicaid Other

## 2013-05-14 DIAGNOSIS — R4020 Unspecified coma: Secondary | ICD-10-CM

## 2013-05-14 LAB — GLUCOSE, CAPILLARY
Glucose-Capillary: 171 mg/dL — ABNORMAL HIGH (ref 70–99)
Glucose-Capillary: 128 mg/dL — ABNORMAL HIGH (ref 70–99)
Glucose-Capillary: 139 mg/dL — ABNORMAL HIGH (ref 70–99)
Glucose-Capillary: 145 mg/dL — ABNORMAL HIGH (ref 70–99)

## 2013-05-14 LAB — BASIC METABOLIC PANEL
CO2: 31 mEq/L (ref 19–32)
Calcium: 8.5 mg/dL (ref 8.4–10.5)
Creatinine, Ser: 0.91 mg/dL (ref 0.50–1.10)
GFR calc non Af Amer: 69 mL/min — ABNORMAL LOW (ref >90)
Glucose, Bld: 185 mg/dL — ABNORMAL HIGH (ref 70–99)
Sodium: 159 mEq/L — ABNORMAL HIGH (ref 135–145)

## 2013-05-14 LAB — CBC
MCH: 29.6 pg (ref 26.0–34.0)
MCHC: 30.5 g/dL (ref 30.0–36.0)
MCV: 97 fL (ref 78.0–100.0)
Platelets: 254 10*3/uL (ref 150–400)
RBC: 3.31 MIL/uL — ABNORMAL LOW (ref 3.87–5.11)
RDW: 13 % (ref 11.5–15.5)

## 2013-05-14 LAB — OCCULT BLOOD X 1 CARD TO LAB, STOOL: Fecal Occult Bld: NEGATIVE

## 2013-05-14 NOTE — Progress Notes (Signed)
NEURO HOSPITALIST PROGRESS NOTE   SUBJECTIVE:                                                                                                                        Neurologically unchanged, intubated on the vent. Off sedatives. On triple AED's: vimpat, keppra, dilantin.  OBJECTIVE:                                                                                                                           Vital signs in last 24 hours: Temp:  [97.8 F (36.6 C)-99.2 F (37.3 C)] 98 F (36.7 C) (07/19 0800) Pulse Rate:  [68-81] 72 (07/19 1129) Resp:  [15-24] 16 (07/19 1129) BP: (126-179)/(42-65) 162/65 mmHg (07/19 1129) SpO2:  [97 %-100 %] 100 % (07/19 1129) FiO2 (%):  [29.6 %-97.6 %] 30 % (07/19 1129)  Intake/Output from previous day: 07/18 0701 - 07/19 0700 In: 1985 [I.V.:470; NG/GT:1380; IV Piggyback:135] Out: 3070 [Urine:3070] Intake/Output this shift: Total I/O In: 240 [I.V.:80; NG/GT:135; IV Piggyback:25] Out: 400 [Urine:400] Nutritional status:    Past Medical History  Diagnosis Date  . Stroke   . Diabetes mellitus without complication   . Hypertension   . CHF (congestive heart failure)       Neurologic Exam:  Mental status: comatose.  CN 2-12: pupils measure 3-4 mm bilaterally, with sluggish light reactivity. No gaze preference. Couldn't elicit a corneal response in the left but present in the right. Gag present. Motor: no spontaneous movements.  Sensory: doesn't react to pain.  Coordination and gait: no tested    Lab Results: Lab Results  Component Value Date/Time   CHOL 126 05/03/2013  4:35 AM   Lipid Panel No results found for this basename: CHOL, TRIG, HDL, CHOLHDL, VLDL, LDLCALC,  in the last 72 hours  Studies/Results: Dg Chest Port 1 View  05/14/2013   *RADIOLOGY REPORT*  Clinical Data: Evaluate endotracheal tube position  PORTABLE CHEST - 1 VIEW  Comparison: 05/13/2013  Findings: No change in the position of  endotracheal tube with tip about 4.7 cm above the carina.  No change in position of right central line.  Heart size normal.  No abnormal pulmonary parenchymal opacities.  No change position of the NG tube.  IMPRESSION: Stable appearance of support devices.  Improved bilateral lower lobe aeration.   Original Report Authenticated By: Esperanza Heir, M.D.   Dg Chest Port 1 View  05/13/2013   *RADIOLOGY REPORT*  Clinical Data: Evaluate endotracheal tube position.  PORTABLE CHEST - 1 VIEW  Comparison: 05/11/2013 through 05/08/2013.  Findings: Endotracheal tube tip is 44 mm from the carina.  Enteric tube is present with redundant loop in the proximal stomach.  Right IJ central line is unchanged with the tip at the cavoatrial junction.  Cardiopericardial silhouette appears similar.  Pulmonary vascular congestion.  Mild basilar airspace disease compatible with pulmonary edema.  Small right pleural effusion.  Compared yesterday's exam, the pulmonary aeration appears slightly worse. Retrocardiac density is present likely representing atelectasis, edema and effusion.  IMPRESSION:  1.  Stable support apparatus. 2.  Worsening pulmonary aeration compatible with developing pulmonary edema and small right pleural effusion.   Original Report Authenticated By: Andreas Newport, M.D.    MEDICATIONS                                                                                                                       I have reviewed the patient's current medications.  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 Brain biopsy? Pentobarbital discontinued 7/10 and still comatose.  Will re image her brain and get EEG again. Family meeting scheduled for next Monday.  Wyatt Portela  ,MD [facility] [phone]  05/14/2013, 12:09 PM

## 2013-05-14 NOTE — Progress Notes (Signed)
PULMONARY  / CRITICAL CARE MEDICINE  Name: Brenda Pope MRN: [id number] DOB: 1955-11-01    ADMISSION DATE:  05/02/2013 CONSULTATION DATE:  7/7/104  REFERRING MD :  Transfer from [facility] PRIMARY SERVICE:  PCCM  CHIEF COMPLAINT:  Status epilepticus  BRIEF PATIENT DESCRIPTION: 57 yo with past medical history of CVAs/TIAs and diastolic heart failure, severe baseline hypertension with noncompliance, poorly controlled diabetes admitted to [facility] on 7/5 with acute dyspnea and suspected CHF exacerbation.  On the day of admission developed developed neurological findings consistent with acute CVA. On 7/6 intubated for airway protection during suspected seizure activity.  Hypotensive post intubation requiring vasopressors. MRI confirmed small acute infarcts of the right parietal lobe. On 7/7 transferred to [facility] for further management.  SIGNIFICANT EVENTS / STUDIES:  7/5  Admitted to [place] [facility] [facility] with CHF exacerbation; stroke symptoms 7/6  Suspected seizure activity, intubated for airway protection; hypotensive post intubation requiring vasopressors 7/6  Brain MRI:  Multiple small acute infarcts R parietal lobe, possible small infarcts left basal ganglia and central pons 7/6  TTE:  No source of CVA, EF 55-60% 7/6  Carotid Doppler:  No evidence of hemodynamically significant stenosis 7/7  Transferred to [facility], PCCM serivce 7/7 Neuro consult: "Concern patient is on SE in the context of recent cortical stroke." 7/8 MRI brain: Confluent and scattered right MCA and right PCA acute infarcts, plus scattered small left hemisphere and occasional posterior fossa acute infarcts. The vast majority of these infarcts are newly seen since 05/01/2013.  This appearance suggests embolic phenomena which severely affected more proximal/medium-sized vessels of the right MCA and PCA territories. Cytotoxic edema without significant mass effect at this time. No associated hemorrhage 7/8 EEG: severe diffuse  encephalopathic process with asymmetrical involvement. Findings reported from the left hemisphere were nonspecific. Findings record from the right hemisphere were consistent with acute large area of infarction. No frank epileptiform activity was recorded. 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  7/10 LP performed by Neuro:  7/10 Burst suppression with propofol. Norepi initiated to prevent propofol induced hypotension. Consideration of primary CNS vasculitis. Serologies ordered>> CRP, ESR markedly elevated. High dose methylpred ordered. Insulin gtt ordered while on high dose steriods 7/10 propofol ineffective at burst suppression. pentobarbitol initiated 7/11 CT head: Right frontal lobe, right parietal lobe, right occipital lobe and posterior right temporal lobe acute/subacute infarcts. Surrounding edema with local mass effect. Minimal petechial hemorrhage suspected particularly along the superior margin of the infarct. No gross hemorrhage noted. No midline shift 7/11 angiogram eval for vasculitis: smooth focal areas of narrowing of prox PCAs and distal pericallosal. Subcortical branches, suspicious of vasculitis. 7/14 TEE: no evidence of thrombus  LINES / TUBES: L radial A-line 7/10 >> 7/12 ETT 7/6 >>   R IJ CVL 7/6 >>    CULTURES: MRSA PCR 7/07 >> POS Blood culture >>ng CSF 7/10 >> ng   ANTIBIOTICS: None  SUBJ:  Comatose off all sedative infusions x 5ds Off insulin gtt  VITAL SIGNS: Temp:  [97.8 F (36.6 C)-99.2 F (37.3 C)] 98 F (36.7 C) (07/19 0800) Pulse Rate:  [68-81] 74 (07/19 0900) Resp:  [12-24] 17 (07/19 0900) BP: (126-179)/(42-57) 174/53 mmHg (07/19 0900) SpO2:  [97 %-100 %] 99 % (07/19 0900) FiO2 (%):  [29.6 %-97.6 %] 30.2 % (07/19 0900)  HEMODYNAMICS:   VENTILATOR SETTINGS: Vent Mode:  [-] PSV;CPAP FiO2  (%):  [  29.6 %-97.6 %] 30.2 % Set Rate:  [14 bmp] 14 bmp Vt Set:  [500 mL] 500 mL PEEP:  [5 cmH20] 5 cmH20 Pressure Support:  [10 cmH20] 10 cmH20 Plateau Pressure:  [14 cmH20-16 cmH20] 15 cmH20  INTAKE / OUTPUT: Intake/Output     07/18 0701 - 07/19 0700 07/19 0701 - 07/20 0700   I.V. (mL/kg) 450 (3.9)    NG/GT 1335    IV Piggyback 135    Total Intake(mL/kg) 1920 (16.7)    Urine (mL/kg/hr) 3070 (1.1) 400 (0.9)   Total Output 3070 400   Net -1150 -400        Stool Occurrence 1 x      PHYSICAL EXAMINATION: General: comatose Neuro: no spont movement, no w/d from pain, gag+ HEENT: PERRL, EOMI Cardiovascular:  RRR s M Lungs: resps even, non labored on vent, clear anteriorly Abdomen:  Soft, nontender, bowel sounds diminished Musculoskeletal: warm, 1-2+ BUE edema, 1+ symmetric BLE edema  LABS: BMET  Recent Labs Lab 05/08/13 0510 05/10/13 0430 05/11/13 0400 05/12/13 0435 05/13/13 0323 05/14/13 0500  NA 147* 148* 149* 151* 153* 159*  K 3.9 3.7 4.0 3.6 3.5 3.5  CL 108 113* 112 116* 119* 123*  CO2 29 27 24 27 29 31   GLUCOSE 166* 170* 341* 170* 183* 185*  BUN 64* 96* 106* 109* 85* 75*  CREATININE 1.78* 1.77* 1.81* 1.44* 0.99 0.91  CALCIUM 8.6 8.2* 8.1* 8.5 8.4 8.5  MG 2.3  --   --  2.5  --   --   PHOS 5.7*  --   --  3.6  --   --      CBC  Recent Labs Lab 05/11/13 0400 05/13/13 0323 05/14/13 0500  HGB 12.3 10.6* 9.8*  HCT 37.3 33.1* 32.1*  WBC 20.1* 17.8* 15.3*  PLT 403* 251 254    CXR:  Dg Chest Port 1 View  05/14/2013   *RADIOLOGY REPORT*  Clinical Data: Evaluate endotracheal tube position  PORTABLE CHEST - 1 VIEW  Comparison: 05/13/2013  Findings: No change in the position of endotracheal tube with tip about 4.7 cm above the carina.  No change in position of right central line.  Heart size normal.  No abnormal pulmonary parenchymal opacities.  No change position of the NG tube.  IMPRESSION: Stable appearance of support devices.  Improved bilateral lower lobe  aeration.   Original Report Authenticated By: Esperanza Heir, M.D.   Dg Chest Port 1 View  05/13/2013   *RADIOLOGY REPORT*  Clinical Data: Evaluate endotracheal tube position.  PORTABLE CHEST - 1 VIEW  Comparison: 05/11/2013 through 05/08/2013.  Findings: Endotracheal tube tip is 44 mm from the carina.  Enteric tube is present with redundant loop in the proximal stomach.  Right IJ central line is unchanged with the tip at the cavoatrial junction.  Cardiopericardial silhouette appears similar.  Pulmonary vascular congestion.  Mild basilar airspace disease compatible with pulmonary edema.  Small right pleural effusion.  Compared yesterday's exam, the pulmonary aeration appears slightly worse. Retrocardiac density is present likely representing atelectasis, edema and effusion.  IMPRESSION:  1.  Stable support apparatus. 2.  Worsening pulmonary aeration compatible with developing pulmonary edema and small right pleural effusion.   Original Report Authenticated By: Andreas Newport, M.D.     ASSESSMENT / PLAN:  PULMONARY A:  Acute respiratory failure due to AMS  P:   -SBTs but no extubation with no mental status  -Cont vent bundle  CARDIOVASCULAR   A: propofol/pentobarb induced hypotension, resolved  H/O CHF - compensated Chronic hypertension P:  -off pressors    RENAL A:  Acute on CRI, nonoliguric, uremic Hypernatremia  P:   -Increase free water -f/u chem   GASTROINTESTINAL A:  No issues P:   -Cont TFs   HEMATOLOGIC A:  Mild anemia. P:  -Monitor CBC intermittently -On LMWH for DVT prophy   INFECTIOUS A:  No overt infections P:   -Micro and abx as above   ENDOCRINE  A:  DM2. Hyperglycemia. 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  Need to discuss goals of care, if family wants to cont aggressive care will need trach, PEG.   CRITICAL CARE:  The patient is critically ill with multiple organ systems failure and requires high complexity decision making for assessment and support, frequent evaluation and titration of therapies, application of advanced monitoring technologies and extensive interpretation of multiple databases. Critical Care Time devoted to patient care services described in this note is 35 minutes.   *Care during the described time interval was provided by me and/or other providers on the critical care team. I have reviewed this patient's available data, including medical history, events of note, physical examination and test results as part of my evaluation.  Patient seen and examined, agree with above note.  I dictated the care and orders written for this patient under my direction.  Alyson Reedy, MD [phone]

## 2013-05-15 ENCOUNTER — Inpatient Hospital Stay (HOSPITAL_COMMUNITY): Payer: Medicaid Other

## 2013-05-15 LAB — BASIC METABOLIC PANEL
GFR calc Af Amer: >90 mL/min (ref >90)
GFR calc non Af Amer: 78 mL/min — ABNORMAL LOW (ref >90)
Glucose, Bld: 146 mg/dL — ABNORMAL HIGH (ref 70–99)
Potassium: 3.4 mEq/L — ABNORMAL LOW (ref 3.5–5.1)
Sodium: 162 mEq/L (ref 135–145)

## 2013-05-15 LAB — GLUCOSE, CAPILLARY
Glucose-Capillary: 130 mg/dL — ABNORMAL HIGH (ref 70–99)
Glucose-Capillary: 114 mg/dL — ABNORMAL HIGH (ref 70–99)
Glucose-Capillary: 114 mg/dL — ABNORMAL HIGH (ref 70–99)

## 2013-05-15 MED ORDER — FREE WATER
300.0000 mL | Freq: Four times a day (QID) | Status: DC
  Administered 2013-05-15 – 2013-05-18 (×12): 300 mL

## 2013-05-15 MED ORDER — LACOSAMIDE 200 MG PO TABS
200.0000 mg | ORAL_TABLET | Freq: Two times a day (BID) | ORAL | Status: DC
  Administered 2013-05-15 – 2013-06-17 (×65): 200 mg
  Filled 2013-05-15 (×67): qty 1

## 2013-05-15 NOTE — Progress Notes (Signed)
[facility] ADULT ICU REPLACEMENT PROTOCOL FOR AM LAB REPLACEMENT ONLY  The patient does not apply for the [facility] Adult ICU Electrolyte Replacment Protocol based on the criteria listed below:   1. Is GFR >/= 40 ml/min? no  Patient's GFR today is  2. Is urine output >/= 0.5 ml/kg/hr for the last 6 hours? no Patient's UOP is  ml/kg/hr 3. Is BUN < 60 mg/dL? no  Patient's BUN today is 63 4. Abnormal electrolyte(s): k 3.4 5. Ordered repletion with: NA 6. If Pope panic level lab has been reported, has the CCM MD in charge been notified? yes.   Physician:  Dr Darrick Penna  Brenda Pope, Brenda Pope 05/15/2013 5:09 AM

## 2013-05-15 NOTE — Progress Notes (Signed)
PULMONARY  / CRITICAL CARE MEDICINE  Name: Brenda Pope MRN: [id number] DOB: 07/24/1956    ADMISSION DATE:  05/02/2013 CONSULTATION DATE:  7/7/104  REFERRING MD :  Transfer from [facility] PRIMARY SERVICE:  PCCM  CHIEF COMPLAINT:  Status epilepticus  BRIEF PATIENT DESCRIPTION: 57 yo with past medical history of CVAs/TIAs and diastolic heart failure, severe baseline hypertension with noncompliance, poorly controlled diabetes admitted to [facility] on 7/5 with acute dyspnea and suspected CHF exacerbation.  On the day of admission developed developed neurological findings consistent with acute CVA. On 7/6 intubated for airway protection during suspected seizure activity.  Hypotensive post intubation requiring vasopressors. MRI confirmed small acute infarcts of the right parietal lobe. On 7/7 transferred to [facility] for further management.  SIGNIFICANT EVENTS / STUDIES:  7/5  Admitted to [facility] [facility] with CHF exacerbation; stroke symptoms 7/6  Suspected seizure activity, intubated for airway protection; hypotensive post intubation requiring vasopressors 7/6  Brain MRI:  Multiple small acute infarcts R parietal lobe, possible small infarcts left basal ganglia and central pons 7/6  TTE:  No source of CVA, EF 55-60% 7/6  Carotid Doppler:  No evidence of hemodynamically significant stenosis 7/7  Transferred to [facility], PCCM serivce 7/7 Neuro consult: "Concern patient is on SE in the context of recent cortical stroke." 7/8 MRI brain: Confluent and scattered right MCA and right PCA acute infarcts, plus scattered small left hemisphere and occasional posterior fossa acute infarcts. The vast majority of these infarcts are newly seen since 05/01/2013.  This appearance suggests embolic phenomena which severely affected more proximal/medium-sized vessels of the right MCA and PCA territories. Cytotoxic edema without significant mass effect at this time. No associated hemorrhage 7/8 EEG: severe diffuse  encephalopathic process with asymmetrical involvement. Findings reported from the left hemisphere were nonspecific. Findings record from the right hemisphere were consistent with acute large area of infarction. No frank epileptiform activity was recorded. 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  7/10 LP performed by Neuro:  7/10 Burst suppression with propofol. Norepi initiated to prevent propofol induced hypotension. Consideration of primary CNS vasculitis. Serologies ordered>> CRP, ESR markedly elevated. High dose methylpred ordered. Insulin gtt ordered while on high dose steriods 7/10 propofol ineffective at burst suppression. pentobarbitol initiated 7/11 CT head: Right frontal lobe, right parietal lobe, right occipital lobe and posterior right temporal lobe acute/subacute infarcts. Surrounding edema with local mass effect. Minimal petechial hemorrhage suspected particularly along the superior margin of the infarct. No gross hemorrhage noted. No midline shift 7/11 angiogram eval for vasculitis: smooth focal areas of narrowing of prox PCAs and distal pericallosal. Subcortical branches, suspicious of vasculitis. 7/14 TEE: no evidence of thrombus  LINES / TUBES: L radial A-line 7/10 >> 7/12 ETT 7/6 >>   R IJ CVL 7/6 >>    CULTURES: MRSA PCR 7/07 >> POS Blood culture >>ng CSF 7/10 >> ng   ANTIBIOTICS: None  SUBJ:  Comatose off all sedative infusions x 7ds  VITAL SIGNS: Temp:  [98.1 F (36.7 C)-100 F (37.8 C)] 100 F (37.8 C) (07/20 0700) Pulse Rate:  [69-83] 76 (07/20 1000) Resp:  [16-21] 16 (07/20 1000) BP: (154-171)/(44-65) 158/47 mmHg (07/20 1000) SpO2:  [99 %-100 %] 99 % (07/20 1000) FiO2 (%):  [29.9 %-30.3 %] 29.9 % (07/20 1000)  HEMODYNAMICS:   VENTILATOR SETTINGS: Vent Mode:  [-] PRVC FiO2 (%):  [29.9 %-  30.3 %]  29.9 % Set Rate:  [14 bmp] 14 bmp Vt Set:  [500 mL] 500 mL PEEP:  [5 cmH20-5.1 cmH20] 5.1 cmH20 Pressure Support:  [10 cmH20] 10 cmH20 Plateau Pressure:  [16 cmH20-18 cmH20] 18 cmH20  INTAKE / OUTPUT: Intake/Output     07/19 0701 - 07/20 0700 07/20 0701 - 07/21 0700   I.V. (mL/kg) 440 (3.8)    NG/GT 1435 135   IV Piggyback 25    Total Intake(mL/kg) 1900 (16.5) 135 (1.2)   Urine (mL/kg/hr) 2740 (1) 1100 (2.6)   Total Output 2740 1100   Net -840 -965          PHYSICAL EXAMINATION: General: comatose Neuro: no spont movement, no w/d from pain, gag+ HEENT: PERRL, EOMI Cardiovascular:  RRR s M Lungs: resps even, non labored on vent, few scattered rhonchi  Abdomen:  Soft, nontender, bowel sounds diminished Musculoskeletal: warm, 1-2+ BUE edema, 1+ symmetric BLE edema  LABS: BMET  Recent Labs Lab 05/11/13 0400 05/12/13 0435 05/13/13 0323 05/14/13 0500 05/15/13 0315  NA 149* 151* 153* 159* 162*  K 4.0 3.6 3.5 3.5 3.4*  CL 112 116* 119* 123* 125*  CO2 24 27 29 31  33*  GLUCOSE 341* 170* 183* 185* 146*  BUN 106* 109* 85* 75* 63*  CREATININE 1.81* 1.44* 0.99 0.91 0.82  CALCIUM 8.1* 8.5 8.4 8.5 8.4  MG  --  2.5  --   --   --   PHOS  --  3.6  --   --   --      CBC  Recent Labs Lab 05/11/13 0400 05/13/13 0323 05/14/13 0500  HGB 12.3 10.6* 9.8*  HCT 37.3 33.1* 32.1*  WBC 20.1* 17.8* 15.3*  PLT 403* 251 254    CXR:  Dg Chest Port 1 View  05/14/2013   *RADIOLOGY REPORT*  Clinical Data: Evaluate endotracheal tube position  PORTABLE CHEST - 1 VIEW  Comparison: 05/13/2013  Findings: No change in the position of endotracheal tube with tip about 4.7 cm above the carina.  No change in position of right central line.  Heart size normal.  No abnormal pulmonary parenchymal opacities.  No change position of the NG tube.  IMPRESSION: Stable appearance of support devices.  Improved bilateral lower lobe aeration.   Original Report Authenticated By: Esperanza Heir, M.D.      ASSESSMENT / PLAN:  PULMONARY A:  Acute respiratory failure due to AMS  P:   -SBTs but no extubation with no mental status  -Cont vent bundle  CARDIOVASCULAR   A: propofol/pentobarb induced hypotension, resolved H/O CHF - compensated Chronic hypertension P:  -off pressors    RENAL A:  Acute on CRI, nonoliguric, uremic Hypernatremia  P:   -Increase free water 7/20 -f/u chem   GASTROINTESTINAL A:  No issues P:   -Cont TFs   HEMATOLOGIC A:  Mild anemia. P:  -Monitor CBC intermittently -On LMWH for DVT prophy   INFECTIOUS A:  No overt infections P:   -Micro and abx as above   ENDOCRINE  A:  DM2. Hyperglycemia. P:   -lantus, SSI   NEUROLOGIC A:  Acute CVA in multiple territories, unclear etiology Acute encephalopathy, coma Status epilepticus Concern for CNS vasculitis-received pulse dose steroids P:   -Mgmt per Neuro -?brain bx -MRI, EEG 7/20 -Family mtg 7/21  CRITICAL CARE:  The patient is critically ill with multiple organ systems failure and requires high complexity decision making for assessment and support, frequent evaluation and titration of therapies,  application of advanced monitoring technologies and extensive interpretation of multiple databases. Critical Care Time devoted to patient care services described in this note is 35 minutes.   *Care during the described time interval was provided by me and/or other providers on the critical care team. I have reviewed this patient's available data, including medical history, events of note, physical examination and test results as part of my evaluation.  Alyson Reedy, M.D. [facility] Pulmonary/Critical Care Medicine. Pager: [phone]. After hours pager: [phone].

## 2013-05-15 NOTE — Progress Notes (Signed)
Pt transported to MRI and back to 3113 with Caesar Chestnut, RRT, RCP assistance.

## 2013-05-15 NOTE — Plan of Care (Signed)
Problem: Phase II Progression Outcomes Goal: Neuro status stablized/improved Outcome: Not Progressing Pt remains unresponsive to noxious stimuli. Gag and corneals present.

## 2013-05-16 ENCOUNTER — Inpatient Hospital Stay (HOSPITAL_COMMUNITY): Payer: Medicaid Other

## 2013-05-16 LAB — COMPREHENSIVE METABOLIC PANEL
ALT: 134 U/L — ABNORMAL HIGH (ref 0–35)
AST: 94 U/L — ABNORMAL HIGH (ref 0–37)
Albumin: 1.6 g/dL — ABNORMAL LOW (ref 3.5–5.2)
Alkaline Phosphatase: 125 U/L — ABNORMAL HIGH (ref 39–117)
BUN: 59 mg/dL — ABNORMAL HIGH (ref 6–23)
Chloride: 125 mEq/L — ABNORMAL HIGH (ref 96–112)
Potassium: 3.2 mEq/L — ABNORMAL LOW (ref 3.5–5.1)
Sodium: 162 mEq/L (ref 135–145)
Total Bilirubin: 0.2 mg/dL — ABNORMAL LOW (ref 0.3–1.2)
Total Protein: 5.3 g/dL — ABNORMAL LOW (ref 6.0–8.3)

## 2013-05-16 LAB — GLUCOSE, CAPILLARY
Glucose-Capillary: 142 mg/dL — ABNORMAL HIGH (ref 70–99)
Glucose-Capillary: 106 mg/dL — ABNORMAL HIGH (ref 70–99)
Glucose-Capillary: 103 mg/dL — ABNORMAL HIGH (ref 70–99)
Glucose-Capillary: 196 mg/dL — ABNORMAL HIGH (ref 70–99)

## 2013-05-16 LAB — PHENYTOIN LEVEL, TOTAL: Phenytoin Lvl: 8.1 ug/mL — ABNORMAL LOW (ref 10.0–20.0)

## 2013-05-16 MED ORDER — INSULIN GLARGINE 100 UNIT/ML ~~LOC~~ SOLN
30.0000 [IU] | Freq: Every day | SUBCUTANEOUS | Status: DC
  Administered 2013-05-16 – 2013-05-17 (×2): 30 [IU] via SUBCUTANEOUS
  Filled 2013-05-16 (×3): qty 0.3

## 2013-05-16 MED ORDER — POTASSIUM CL IN DEXTROSE 5% 20 MEQ/L IV SOLN: 20.0000 meq | INTRAVENOUS | Status: DC

## 2013-05-16 MED ORDER — INSULIN ASPART 100 UNIT/ML ~~LOC~~ SOLN
0.0000 [IU] | SUBCUTANEOUS | Status: DC
  Administered 2013-05-16: 3 [IU] via SUBCUTANEOUS
  Administered 2013-05-17: 8 [IU] via SUBCUTANEOUS
  Administered 2013-05-17: 5 [IU] via SUBCUTANEOUS
  Administered 2013-05-17: 8 [IU] via SUBCUTANEOUS
  Administered 2013-05-17: 12:00:00 via SUBCUTANEOUS
  Administered 2013-05-17 (×2): 5 [IU] via SUBCUTANEOUS
  Administered 2013-05-18: 8 [IU] via SUBCUTANEOUS
  Administered 2013-05-18: 5 [IU] via SUBCUTANEOUS

## 2013-05-16 MED ORDER — ACETAMINOPHEN 160 MG/5ML PO SOLN
500.0000 mg | ORAL | Status: DC | PRN
  Administered 2013-05-16 – 2013-06-17 (×9): 500 mg
  Filled 2013-05-16 (×9): qty 20.3

## 2013-05-16 MED ORDER — POTASSIUM CHLORIDE 20 MEQ/15ML (10%) PO LIQD
40.0000 meq | Freq: Once | ORAL | Status: AC
  Administered 2013-05-16: 40 meq
  Filled 2013-05-16: qty 30

## 2013-05-16 MED ORDER — POTASSIUM CL IN DEXTROSE 5% 20 MEQ/L IV SOLN
20.0000 meq | INTRAVENOUS | Status: DC
  Administered 2013-05-16 – 2013-05-18 (×4): 20 meq via INTRAVENOUS
  Filled 2013-05-16 (×7): qty 1000

## 2013-05-16 NOTE — Progress Notes (Signed)
PULMONARY  / CRITICAL CARE MEDICINE  Name: Brenda Pope MRN: [id number] DOB: [birth year]    ADMISSION DATE:  05/02/2013 CONSULTATION DATE:  7/7/104  REFERRING MD :  Transfer from [facility] PRIMARY SERVICE:  PCCM  CHIEF COMPLAINT:  Status epilepticus  BRIEF PATIENT DESCRIPTION: 57 yo with past medical history of CVAs/TIAs and diastolic heart failure, severe baseline hypertension with noncompliance, poorly controlled diabetes admitted to [facility] on 7/5 with acute dyspnea and suspected CHF exacerbation.  On the day of admission developed developed neurological findings consistent with acute CVA. On 7/6 intubated for airway protection during suspected seizure activity.  Hypotensive post intubation requiring vasopressors. MRI confirmed small acute infarcts of the right parietal lobe. On 7/7 transferred to [facility] for further management.  SIGNIFICANT EVENTS / STUDIES:  7/5  Admitted to [facility] with CHF exacerbation; stroke symptoms 7/6  Suspected seizure activity, intubated for airway protection; hypotensive post intubation requiring vasopressors 7/6  Brain MRI:  Multiple small acute infarcts R parietal lobe, possible small infarcts left basal ganglia and central pons 7/6  TTE:  No source of CVA, EF 55-60% 7/6  Carotid Doppler:  No evidence of hemodynamically significant stenosis 7/7  Transferred to [facility], PCCM serivce 7/7 Neuro consult: "Concern patient is on SE in the context of recent cortical stroke." 7/8 MRI brain: Confluent and scattered right MCA and right PCA acute infarcts, plus scattered small left hemisphere and occasional posterior fossa acute infarcts. The vast majority of these infarcts are newly seen since 05/01/2013.  This appearance suggests embolic phenomena which severely affected more proximal/medium-sized vessels of the right MCA and PCA territories. Cytotoxic edema without significant mass effect at this time. No associated hemorrhage 7/8 EEG: severe diffuse  encephalopathic process with asymmetrical involvement. Findings reported from the left hemisphere were nonspecific. Findings record from the right hemisphere were consistent with acute large area of infarction. No frank epileptiform activity was recorded. 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  7/10 LP performed by Neuro:  7/10 Burst suppression with propofol. Norepi initiated to prevent propofol induced hypotension. Consideration of primary CNS vasculitis. Serologies ordered>> CRP, ESR markedly elevated. High dose methylpred ordered. Insulin gtt ordered while on high dose steriods 7/10 propofol ineffective at burst suppression. pentobarbitol initiated 7/11 CT head: Right frontal lobe, right parietal lobe, right occipital lobe and posterior right temporal lobe acute/subacute infarcts. Surrounding edema with local mass effect. Minimal petechial hemorrhage suspected particularly along the superior margin of the infarct. No gross hemorrhage noted. No midline shift 7/11 angiogram eval for vasculitis: smooth focal areas of narrowing of prox PCAs and distal pericallosal. Subcortical branches, suspicious of vasculitis. 7/14 TEE: no evidence of thrombus 7/20 MRI brain: Overall mildly regressed signal abnormality in the brain associated with multi focal infarcts. The most confluent infarcts  in the right posterior MCA and PCA territories do demonstrate persistent restricted diffusion, but less cytotoxic edema. There is trace petechial hemorrhage, but no malignant hemorrhagic transformation or associated mass effect. Slight extension of a central pontine lacunar infarct (pontine perforator artery territory) without mass effect or hemorrhage 7/21 EEG: severe encephalopathy, non specific as to cause. No electrography seizures noted.    LINES / TUBES: L  radial A-line 7/10 >> 7/12 R IJ CVL 7/6 >> 7/21 ETT 7/6 >>   PICC 7/21 >>    CULTURES: MRSA PCR 7/07 >> POS Blood culture >>ng  CSF 7/10 >> ng   ANTIBIOTICS: None  SUBJ:  Remains comatose  VITAL SIGNS: Temp:  [98 F (36.7 C)-99.8 F (37.7 C)] 99.5 F (37.5 C) (07/21 0402) Pulse Rate:  [69-76] 71 (07/21 0738) Resp:  [15-21] 18 (07/21 0738) BP: (144-177)/(47-59) 169/51 mmHg (07/21 0738) SpO2:  [95 %-99 %] 97 % (07/21 0738) FiO2 (%):  [29.9 %-40 %] 40 % (07/21 0738)  HEMODYNAMICS:   VENTILATOR SETTINGS: Vent Mode:  [-] CPAP;PSV FiO2 (%):  [29.9 %-40 %] 40 % Set Rate:  [14 bmp] 14 bmp Vt Set:  [500 mL] 500 mL PEEP:  [5 cmH20] 5 cmH20 Pressure Support:  [8 cmH20-10 cmH20] 8 cmH20 Plateau Pressure:  [11 cmH20-14 cmH20] 14 cmH20  INTAKE / OUTPUT: Intake/Output     07/20 0701 - 07/21 0700 07/21 0701 - 07/22 0700   I.V. (mL/kg) 480 (4.2)    NG/GT 1375.5    IV Piggyback 45    Total Intake(mL/kg) 1900.5 (16.5)    Urine (mL/kg/hr) 2920 (1.1)    Total Output 2920     Net -1019.5            PHYSICAL EXAMINATION: General: comatose Neuro: no spont movement, no w/d from pain, gag+ HEENT: PERRL, EOMI Cardiovascular:  RRR s M Lungs: resps even, non labored on vent, few scattered rhonchi  Abdomen:  Soft, nontender, bowel sounds diminished Musculoskeletal: warm, 1-2+ BUE edema, 1+ symmetric BLE edema  LABS: BMET  Recent Labs Lab 05/12/13 0435 05/13/13 0323 05/14/13 0500 05/15/13 0315 05/16/13 0220  NA 151* 153* 159* 162* 162*  K 3.6 3.5 3.5 3.4* 3.2*  CL 116* 119* 123* 125* 125*  CO2 27 29 31  33* 32  GLUCOSE 170* 183* 185* 146* 129*  BUN 109* 85* 75* 63* 59*  CREATININE 1.44* 0.99 0.91 0.82 0.84  CALCIUM 8.5 8.4 8.5 8.4 8.3*  MG 2.5  --   --   --   --   PHOS 3.6  --   --   --   --      CBC  Recent Labs Lab 05/11/13 0400 05/13/13 0323 05/14/13 0500  HGB 12.3 10.6* 9.8*  HCT 37.3 33.1* 32.1*  WBC 20.1* 17.8* 15.3*  PLT 403* 251 254    CXR:  NNF    ASSESSMENT / PLAN:  PULMONARY A:  Acute respiratory failure due to AMS  Poor cognition prohibits extubation P:   Cont PSV as tolerated Cont vent bundle  CARDIOVASCULAR  A: propofol/pentobarb induced hypotension, resolved H/O CHF - compensated Chronic hypertension P:  Monitor  RENAL A:  Acute on CRI, nonoliguric Hypernatremia  P:   Monitor BMET Correct electrolytes as needed  GASTROINTESTINAL A:  No issues P:   -Cont TFs   HEMATOLOGIC A:  Mild anemia. P:  -Monitor CBC intermittently -On LMWH for DVT prophy   INFECTIOUS A:  No overt infections P:   -Micro and abx as above   ENDOCRINE  A:  DM2. Hyperglycemia. 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 I described our ongoing uncertainty re: the diagnosis and her lack of improvement despite 2 wks of support, extensive evaluation and mutiple empiric therapies. They understand that her prognosis for functional recovery is extremely poor. I  indicated that it would be medically acceptable at this point to extubate with no plans for re-intubation. I described the alternative of trach/G-tube and SNF in what would likely be a PVS. They are considering the alternatives   CRITICAL CARE:  The patient is critically ill with multiple organ systems failure and requires high complexity decision making for assessment and support, frequent evaluation and titration of therapies, application of advanced monitoring technologies and extensive interpretation of multiple databases. Critical Care Time devoted to patient care services described in this note is 55 minutes.   Billy Fischer, MD ; [facility] [phone].  After 5:30 PM or weekends, call  [phone]

## 2013-05-16 NOTE — Progress Notes (Signed)
Portable EEG completed

## 2013-05-16 NOTE — Progress Notes (Signed)
UR of chart completed.  Family meeting this am with Dr. Sung Amabile, assigned RN-Elaine, myself, pt's husband, daughter and numerous siblings and their spouses.  Pt's condition was discussed as well as options for the pt's continued care, i.e. Aggressive care with placement of trach and PEG vs comfort care.  MD also addressed code status with the family.  No decisions were made at the time of the meeting. Family was to discuss and get back with the MD in the next couple of days.

## 2013-05-16 NOTE — Progress Notes (Signed)
Unable to replace K 3.2 with electrolyte protocol d/t no urine output being recorded

## 2013-05-16 NOTE — Progress Notes (Deleted)
Patient agitated and restless despite pain, suctioning, and repositioning interventions. BP elevated and tachycardic. MD notified. New orders given. See MAR. Will continue to monitor patient closely. [place], Glennys Schorsch M  

## 2013-05-16 NOTE — Progress Notes (Signed)
CRITICAL VALUE ALERT  Critical value received: sodium = 162  Date of notification:  01/14/2013  Time of notification:  0335  Critical value read back:yes  Nurse who received alert: Jonelle Sidle  MD notified (1st page):  Dr Thad Ranger  Time of first page:  [phone]  MD notified (2nd page):  Time of second page:  Responding MD:  Dr Thad Ranger  Time MD responded:  [phone]

## 2013-05-16 NOTE — Progress Notes (Deleted)
Patient agitated and restless despite pain, suctioning, and repositioning interventions. BP elevated and tachycardic. MD notified. New orders given. See MAR. Will continue to monitor patient closely. [place], [place] Scheiderer M  

## 2013-05-16 NOTE — Progress Notes (Signed)
eLink Physician-Brief Progress Note Patient Name: Brenda Pope DOB: 1956-06-07 MRN: [id number]  Date of Service  05/16/2013   HPI/Events of Note  Hypokalemia   eICU Interventions  Potassium replaced      Mohamadou Maciver 05/16/2013, 6:43 AM

## 2013-05-16 NOTE — Progress Notes (Signed)
NUTRITION FOLLOW UP  Intervention:    Continue Vital AF 1.2 at 45 ml/h with prostat 30 ml 5 times daily to provide a total of 1796 kcals (25 kcals/kg ideal weight), 156 gm protein, 876 ml free water daily.  Nutrition Dx:   Inadequate oral intake related to inability to eat as evidenced by NPO status; ongoing.  Goal:   Enteral nutrition to provide 60-70% of estimated calorie needs (22-25 kcals/kg ideal body weight) and 100% of estimated protein needs, based on ASPEN guidelines for permissive underfeeding in critically ill obese individuals. Met.  Monitor:   TF tolerance/adequacy, labs, weight trend, vent status, overall goals of care.  Assessment:   S/P family meeting with physician. Family to decide on options for continued care (PEG/trach vs comfort care) and discuss with physician in the next few days. Free water flushes were increased 7/20 due to hypernatremia. Patient remains intubated on ventilator support.  MV: 8 Temp:Temp (24hrs), Avg:99.1 F (37.3 C), Min:98 F (36.7 C), Max:99.9 F (37.7 C)   Height: Ht Readings from Last 1 Encounters:  05/02/13 6' (1.829 m)    Weight Status:   Wt Readings from Last 1 Encounters:  05/07/13 254 lb 3.1 oz (115.3 kg)  05/06/13  257 lb 0.9 oz (116.6 kg)  05/02/13  238 lb 1.6 oz (108 kg)   Re-estimated needs:  Kcal: 2050 Protein: 145 gm protein Fluid: 2-2.2 L   Skin: no issues  Diet Order:  NPO  TF Order: Vital AF 1.2 at 45 ml/h with prostat 30 ml 5 times daily to provide a total of 1796 kcals (25 kcals/kg ideal weight), 156 gm protein, 876 ml free water daily.  Free water flushes: 300 ml every 6 hours  Intake/Output Summary (Last 24 hours) at 05/16/13 1555 Last data filed at 05/16/13 1500  Gross per 24 hour  Intake 1015.5 ml  Output   1730 ml  Net -714.5 ml    Last BM: 7/20   Labs:   Recent Labs Lab 05/12/13 0435  05/14/13 0500 05/15/13 0315 05/16/13 0220  NA 151*  < > 159* 162* 162*  K 3.6  < > 3.5 3.4* 3.2*   CL 116*  < > 123* 125* 125*  CO2 27  < > 31 33* 32  BUN 109*  < > 75* 63* 59*  CREATININE 1.44*  < > 0.91 0.82 0.84  CALCIUM 8.5  < > 8.5 8.4 8.3*  MG 2.5  --   --   --   --   PHOS 3.6  --   --   --   --   GLUCOSE 170*  < > 185* 146* 129*  < > = values in this interval not displayed.  CBG (last 3)   Recent Labs  05/16/13 0359 05/16/13 0813 05/16/13 1240  GLUCAP 106* 117* 103*    Scheduled Meds: . antiseptic oral rinse  1 application Mouth Rinse QID  . aspirin  325 mg Per Tube Daily  . chlorhexidine  15 mL Mouth/Throat BID  . enoxaparin (LOVENOX) injection  40 mg Subcutaneous Q24H  . feeding supplement  30 mL Per Tube 5 X Daily  . free water  300 mL Per Tube Q6H  . insulin aspart  0-15 Units Subcutaneous Q4H  . insulin glargine  30 Units Subcutaneous QHS  . lacosamide  200 mg Per Tube BID  . levETIRAcetam  1,000 mg Per Tube BID  . pantoprazole sodium  40 mg Per Tube Daily  . phenytoin  100  mg Per Tube TID    Continuous Infusions: . sodium chloride 20 mL/hr at 05/14/13 0000  . dextrose 5 % with KCl 20 mEq / L 20 mEq (05/16/13 1000)  . feeding supplement (VITAL AF 1.2 CAL) 1,000 mL (05/16/13 0137)    Joaquin Courts, RD, LDN, CNSC Pager [phone] After Hours Pager [phone]

## 2013-05-16 NOTE — Progress Notes (Signed)
Patient's temperature 100.5 axillary. MD notified and new orders received. See MAR. Will continue to monitor the patient closely. [place], [place] M

## 2013-05-16 NOTE — Procedures (Signed)
EEG report.  Brief clinical history: 57 years with multiple cerebral infarcts and resolved NCSE but persistent comatose state.  Technique: this is a 17 channel routine scalp EEG performed  In the ICU setting, with bipolar and monopolar montages arranged in accordance to the international 10/20 system of electrode placement. One channel was dedicated to EKG recording.  Patient is comatose, intubated on the vent. No activating procedures performed.  Description: the study is characterized by continuous, generalized, monomorphic, unreactive, predominantly delta slowing that doesn't follow an ictal pattern at any time during recording.  No focal or generalized epileptiform discharges noted.  EKG showed sinus rhythm.  Impression: this is an abnormal EEG with findings consistent with a severe encephalopathy, non specific as to cause. No electrography seizures noted.  Clinical correlation is advised.  Wyatt Portela, MD

## 2013-05-17 LAB — GLUCOSE, CAPILLARY
Glucose-Capillary: 201 mg/dL — ABNORMAL HIGH (ref 70–99)
Glucose-Capillary: 221 mg/dL — ABNORMAL HIGH (ref 70–99)
Glucose-Capillary: 228 mg/dL — ABNORMAL HIGH (ref 70–99)
Glucose-Capillary: 256 mg/dL — ABNORMAL HIGH (ref 70–99)
Glucose-Capillary: 277 mg/dL — ABNORMAL HIGH (ref 70–99)

## 2013-05-17 LAB — BASIC METABOLIC PANEL
CO2: 32 mEq/L (ref 19–32)
Chloride: 122 mEq/L — ABNORMAL HIGH (ref 96–112)
Glucose, Bld: 238 mg/dL — ABNORMAL HIGH (ref 70–99)
Potassium: 3.4 mEq/L — ABNORMAL LOW (ref 3.5–5.1)
Sodium: 157 mEq/L — ABNORMAL HIGH (ref 135–145)

## 2013-05-17 NOTE — Progress Notes (Signed)
PULMONARY  / CRITICAL CARE MEDICINE  Name: Brenda Pope MRN: [id number] DOB: 1955-12-09    ADMISSION DATE:  05/02/2013 CONSULTATION DATE:  7/7/104  REFERRING MD :  Transfer from [facility] PRIMARY SERVICE:  PCCM  CHIEF COMPLAINT:  Status epilepticus  BRIEF PATIENT DESCRIPTION: 57 yo with past medical history of CVAs/TIAs and diastolic heart failure, severe baseline hypertension with noncompliance, poorly controlled diabetes admitted to [facility] on 7/5 with acute dyspnea and suspected CHF exacerbation.  On the day of admission developed developed neurological findings consistent with acute CVA. On 7/6 intubated for airway protection during suspected seizure activity.  Hypotensive post intubation requiring vasopressors. MRI confirmed small acute infarcts of the right parietal lobe. On 7/7 transferred to [facility] for further management.  SIGNIFICANT EVENTS / STUDIES:  7/5  Admitted to [facility] [facility] with CHF exacerbation; stroke symptoms 7/6  Suspected seizure activity, intubated for airway protection; hypotensive post intubation requiring vasopressors 7/6  Brain MRI:  Multiple small acute infarcts R parietal lobe, possible small infarcts left basal ganglia and central pons 7/6  TTE:  No source of CVA, EF 55-60% 7/6  Carotid Doppler:  No evidence of hemodynamically significant stenosis 7/7  Transferred to [facility], PCCM serivce 7/7 Neuro consult: "Concern patient is on SE in the context of recent cortical stroke." 7/8 MRI brain: Confluent and scattered right MCA and right PCA acute infarcts, plus scattered small left hemisphere and occasional posterior fossa acute infarcts. The vast majority of these infarcts are newly seen since 05/01/2013.  This appearance suggests embolic phenomena which severely affected more proximal/medium-sized vessels of the right MCA and PCA territories. Cytotoxic edema without significant mass effect at this time. No associated hemorrhage 7/8 EEG: severe diffuse  encephalopathic process with asymmetrical involvement. Findings reported from the left hemisphere were nonspecific. Findings record from the right hemisphere were consistent with acute large area of infarction. No frank epileptiform activity was recorded. 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  7/10 LP performed by Neuro:  7/10 Burst suppression with propofol. Norepi initiated to prevent propofol induced hypotension. Consideration of primary CNS vasculitis. Serologies ordered>> CRP, ESR markedly elevated. High dose methylpred ordered. Insulin gtt ordered while on high dose steriods 7/10 propofol ineffective at burst suppression. pentobarbitol initiated 7/11 CT head: Right frontal lobe, right parietal lobe, right occipital lobe and posterior right temporal lobe acute/subacute infarcts. Surrounding edema with local mass effect. Minimal petechial hemorrhage suspected particularly along the superior margin of the infarct. No gross hemorrhage noted. No midline shift 7/11 angiogram eval for vasculitis: smooth focal areas of narrowing of prox PCAs and distal pericallosal. Subcortical branches, suspicious of vasculitis. 7/14 TEE: no evidence of thrombus 7/20 MRI brain: Overall mildly regressed signal abnormality in the brain associated with multi focal infarcts. The most confluent infarcts  in the right posterior MCA and PCA territories do demonstrate persistent restricted diffusion, but less cytotoxic edema. There is trace petechial hemorrhage, but no malignant hemorrhagic transformation or associated mass effect. Slight extension of a central pontine lacunar infarct (pontine perforator artery territory) without mass effect or hemorrhage 7/21 EEG: severe encephalopathy, nonspecific as to cause. No electrography seizures noted.  7/22 Family indicates that  they do not wish to pursue trach/G-tube and SNF. However, they are not ready to "give up" yet citing their concern that all of the meds used to induced coma might not have fully  washed out of her system yet. I have noted that she developed sever hypernatremia and correcting this might improve her MS. We will continue our support for a couple more days. She will be DNR in event of cardiac arrest  LINES / TUBES: L radial A-line 7/10 >> 7/12 R IJ CVL 7/6 >> 7/21 ETT 7/6 >>     CULTURES: MRSA PCR 7/07 >> POS Blood culture >>ng CSF 7/10 >> ng   ANTIBIOTICS: None  SUBJ:  Remains comatose  VITAL SIGNS: Temp:  [98.9 F (37.2 C)-101.9 F (38.8 C)] 98.9 F (37.2 C) (07/22 1230) Pulse Rate:  [67-82] 73 (07/22 1400) Resp:  [14-19] 16 (07/22 1400) BP: (148-174)/(46-58) 170/55 mmHg (07/22 1400) SpO2:  [96 %-99 %] 98 % (07/22 1400) FiO2 (%):  [40 %] 40 % (07/22 1135)  HEMODYNAMICS:   VENTILATOR SETTINGS: Vent Mode:  [-] PSV;CPAP FiO2 (%):  [40 %] 40 % Set Rate:  [14 bmp] 14 bmp Vt Set:  [500 mL] 500 mL PEEP:  [5 cmH20] 5 cmH20 Pressure Support:  [8 cmH20] 8 cmH20 Plateau Pressure:  [10 cmH20-15 cmH20] 15 cmH20  INTAKE / OUTPUT: Intake/Output     07/21 0701 - 07/22 0700 07/22 0701 - 07/23 0700   I.V. (mL/kg) 2840 (24.6)    NG/GT 1120    IV Piggyback     Total Intake(mL/kg) 3960 (34.3)    Urine (mL/kg/hr) 1460 (0.5)    Total Output 1460     Net +2500            PHYSICAL EXAMINATION: General: comatose Neuro: no spont movement, no w/d from pain HEENT: PERRL, EOMI Cardiovascular:  RRR s M Lungs: resps even, non labored on vent, few scattered rhonchi  Abdomen:  Soft, nontender, bowel sounds diminished Musculoskeletal: warm, 1-2+ BUE edema, 1+ symmetric BLE edema  LABS: BMET  Recent Labs Lab 05/12/13 0435 05/13/13 0323 05/14/13 0500 05/15/13 0315 05/16/13 0220 05/17/13 0545  NA 151* 153* 159* 162* 162* 157*  K 3.6 3.5 3.5 3.4* 3.2* 3.4*  CL 116* 119* 123* 125*  125* 122*  CO2 27 29 31  33* 32 32  GLUCOSE 170* 183* 185* 146* 129* 238*  BUN 109* 85* 75* 63* 59* 55*  CREATININE 1.44* 0.99 0.91 0.82 0.84 0.85  CALCIUM 8.5 8.4 8.5 8.4 8.3* 8.3*  MG 2.5  --   --   --   --   --   PHOS 3.6  --   --   --   --   --      CBC  Recent Labs Lab 05/11/13 0400 05/13/13 0323 05/14/13 0500  HGB 12.3 10.6* 9.8*  HCT 37.3 33.1* 32.1*  WBC 20.1* 17.8* 15.3*  PLT 403* 251 254    CXR: NNF    ASSESSMENT / PLAN:  PULMONARY A:  Acute respiratory failure due to AMS  Poor cognition prohibits extubation P:   Cont PSV as tolerated Cont vent bundle  CARDIOVASCULAR  A: propofol/pentobarb induced hypotension, resolved H/O CHF - compensated Chronic hypertension P:  Monitor  RENAL A:  Acute on CRI, nonoliguric Hypernatremia  P:   Monitor BMET Correct electrolytes as needed  GASTROINTESTINAL A:  No issues P:   -Cont TFs   HEMATOLOGIC A:  Mild anemia. P:  -Monitor CBC intermittently -On LMWH for DVT prophy   INFECTIOUS A:  No overt infections P:   -Micro and abx as above   ENDOCRINE  A:  DM2. Hyperglycemia. 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 However, they are not ready to "give up" yet citing their concern that all of the meds used to induced coma might not have fully washed out of her system yet. I have noted that she developed sever hypernatremia and correcting this might improve her MS. We will continue our support for a couple more days. She will be DNR in event of cardiac arrest   CRITICAL CARE:  The patient is critically ill with multiple organ systems failure and requires high complexity decision making for assessment and support, frequent evaluation and titration of  therapies, application of advanced monitoring technologies and extensive interpretation of multiple databases. Critical Care Time devoted to patient care services described in this note is 40 minutes.   Billy Fischer, MD ; [facility] [phone].  After 5:30 PM or weekends, call [phone]

## 2013-05-17 NOTE — Progress Notes (Signed)
[facility] ADULT ICU REPLACEMENT PROTOCOL FOR AM LAB REPLACEMENT ONLY  The patient does not apply for the [facility] Adult ICU Electrolyte Replacment Protocol based on the criteria listed below:   1. Is GFR >/= 40 ml/min? yes  Patient's GFR today is 55 2. Is urine output >/= 0.5 ml/kg/hr for the last 6 hours? yes Patient's UOP is 0.85 ml/kg/hr 3. Is BUN < 60 mg/dL? yes  Patient's BUN today is 75 4. Abnormal electrolyte(s): K 3.4 5. Ordered repletion with: NA unable to replace d/t IVF with K 6. If Pope panic level lab has been reported, has the CCM MD in charge been notified? yes.   Physician:  Dr Darrick Penna  Brenda Pope, Brenda Pope 05/17/2013 6:39 AM

## 2013-05-17 NOTE — Progress Notes (Signed)
NEURO HOSPITALIST PROGRESS NOTE   SUBJECTIVE:                                                                                                                        Remains essentially unchanged. Daughter and close family friend at bedside.  OBJECTIVE:                                                                                                                           Vital signs in last 24 hours: Temp:  [98.9 F (37.2 C)-101.9 F (38.8 C)] 99.5 F (37.5 C) (07/22 1600) Pulse Rate:  [67-82] 73 (07/22 1400) Resp:  [14-19] 16 (07/22 1400) BP: (148-174)/(46-58) 170/55 mmHg (07/22 1400) SpO2:  [96 %-99 %] 98 % (07/22 1400) FiO2 (%):  [40 %] 40 % (07/22 1545)  Intake/Output from previous day: 07/21 0701 - 07/22 0700 In: 3960 [I.V.:2840; NG/GT:1120] Out: 1460 [Urine:1460] Intake/Output this shift:   Nutritional status:    Past Medical History  Diagnosis Date  . Stroke   . Diabetes mellitus without complication   . Hypertension   . CHF (congestive heart failure)     Neurologic Exam:  Mental status: comatose.  CN 2-12: pupils measure 3-4 mm bilaterally, with sluggish light reactivity. No gaze preference. EOM  Present. Couldn't elicit a corneal response in the left but present in the right. Gag present. She  Motor: no spontaneous movements.  Sensory: doesn't react to pain.  Coordination and gait: no tested   Lab Results: Lab Results  Component Value Date/Time   CHOL 126 05/03/2013  4:35 AM   Lipid Panel No results found for this basename: CHOL, TRIG, HDL, CHOLHDL, VLDL, LDLCALC,  in the last 72 hours  Studies/Results: No results found.  MEDICATIONS  I have reviewed the patient's current medications.  ASSESSMENT/PLAN:                                                                                                            57 years old admitted 05/02/13 with profound coma in the context of predominantly right MCA and right PCA territories as well as lacunar infarcts pons and cerebellum + non convulsive status epilepticus that required burst suppression with pentobarbital for several days. Repeat MRI showed no new changes and EEG 7/21 consistent with encephalopathy but no overt electrographic seizures. I had an ample conversation with patient's husband, sister, and other family members and expressed to them that although at this moment she has preserved brainstem and cortical function, I seriously doubt she will be able to have a meaningful recovery from this neurological insult and her ability to function will be importantly compromised. Neurology will continue to follow.  Wyatt Portela, MD  [facility] [phone]  05/17/2013, 5:40 PM

## 2013-05-17 NOTE — Progress Notes (Signed)
Inpatient Diabetes Program Recommendations  AACE/ADA: New Consensus Statement on Inpatient Glycemic Control (2013)  Target Ranges:  Prepandial:   less than 140 mg/dL      Peak postprandial:   less than 180 mg/dL (1-2 hours)      Critically ill patients:  140 - 180 mg/dL   Hyperglycemia return  Lantus dose decreased from 40 units to 30 units, cbg's back into the 200's. Inpatient Diabetes Program Recommendations HgbA1C: done, thank you  Please increase lantus back to 40 units at HS

## 2013-05-18 DIAGNOSIS — J9600 Acute respiratory failure, unspecified whether with hypoxia or hypercapnia: Secondary | ICD-10-CM

## 2013-05-18 LAB — GLUCOSE, CAPILLARY
Glucose-Capillary: 239 mg/dL — ABNORMAL HIGH (ref 70–99)
Glucose-Capillary: 245 mg/dL — ABNORMAL HIGH (ref 70–99)
Glucose-Capillary: 265 mg/dL — ABNORMAL HIGH (ref 70–99)
Glucose-Capillary: 273 mg/dL — ABNORMAL HIGH (ref 70–99)

## 2013-05-18 LAB — COMPREHENSIVE METABOLIC PANEL
ALT: 155 U/L — ABNORMAL HIGH (ref 0–35)
AST: 56 U/L — ABNORMAL HIGH (ref 0–37)
Alkaline Phosphatase: 146 U/L — ABNORMAL HIGH (ref 39–117)
CO2: 28 mEq/L (ref 19–32)
Calcium: 8 mg/dL — ABNORMAL LOW (ref 8.4–10.5)
GFR calc Af Amer: >90 mL/min (ref >90)
Glucose, Bld: 275 mg/dL — ABNORMAL HIGH (ref 70–99)
Potassium: 3.6 mEq/L (ref 3.5–5.1)
Sodium: 151 mEq/L — ABNORMAL HIGH (ref 135–145)
Total Protein: 5 g/dL — ABNORMAL LOW (ref 6.0–8.3)

## 2013-05-18 MED ORDER — FREE WATER
200.0000 mL | Freq: Three times a day (TID) | Status: DC
  Administered 2013-05-18 – 2013-05-20 (×6): 200 mL

## 2013-05-18 MED ORDER — INSULIN GLARGINE 100 UNIT/ML ~~LOC~~ SOLN
40.0000 [IU] | Freq: Every day | SUBCUTANEOUS | Status: DC
  Administered 2013-05-18 – 2013-06-16 (×29): 40 [IU] via SUBCUTANEOUS
  Filled 2013-05-18 (×34): qty 0.4

## 2013-05-18 MED ORDER — INSULIN ASPART 100 UNIT/ML ~~LOC~~ SOLN
0.0000 [IU] | SUBCUTANEOUS | Status: DC
  Administered 2013-05-18 (×2): 7 [IU] via SUBCUTANEOUS
  Administered 2013-05-18 (×2): 11 [IU] via SUBCUTANEOUS
  Administered 2013-05-19: 7 [IU] via SUBCUTANEOUS
  Administered 2013-05-19 (×2): 4 [IU] via SUBCUTANEOUS
  Administered 2013-05-19: 7 [IU] via SUBCUTANEOUS
  Administered 2013-05-19: 4 [IU] via SUBCUTANEOUS
  Administered 2013-05-19 (×2): 7 [IU] via SUBCUTANEOUS
  Administered 2013-05-20: 4 [IU] via SUBCUTANEOUS
  Administered 2013-05-20 (×4): 7 [IU] via SUBCUTANEOUS
  Administered 2013-05-20 – 2013-05-21 (×4): 4 [IU] via SUBCUTANEOUS
  Administered 2013-05-21: 7 [IU] via SUBCUTANEOUS
  Administered 2013-05-21: 4 [IU] via SUBCUTANEOUS
  Administered 2013-05-22: 7 [IU] via SUBCUTANEOUS
  Administered 2013-05-22 (×3): 4 [IU] via SUBCUTANEOUS
  Administered 2013-05-22: 7 [IU] via SUBCUTANEOUS
  Administered 2013-05-22: 4 [IU] via SUBCUTANEOUS
  Administered 2013-05-23 (×2): 7 [IU] via SUBCUTANEOUS
  Administered 2013-05-23 – 2013-05-24 (×4): 4 [IU] via SUBCUTANEOUS
  Administered 2013-05-24: 7 [IU] via SUBCUTANEOUS
  Administered 2013-05-24: 4 [IU] via SUBCUTANEOUS
  Administered 2013-05-24 (×3): 7 [IU] via SUBCUTANEOUS
  Administered 2013-05-25 (×3): 3 [IU] via SUBCUTANEOUS
  Administered 2013-05-25: 4 [IU] via SUBCUTANEOUS
  Administered 2013-05-25: 3 [IU] via SUBCUTANEOUS
  Administered 2013-05-25: 7 [IU] via SUBCUTANEOUS
  Administered 2013-05-26 (×5): 4 [IU] via SUBCUTANEOUS
  Administered 2013-05-26: 7 [IU] via SUBCUTANEOUS
  Administered 2013-05-27 (×2): 3 [IU] via SUBCUTANEOUS
  Administered 2013-05-27: 4 [IU] via SUBCUTANEOUS
  Administered 2013-05-27 (×2): 3 [IU] via SUBCUTANEOUS
  Administered 2013-05-27 – 2013-05-28 (×2): 4 [IU] via SUBCUTANEOUS
  Administered 2013-05-28 (×2): 3 [IU] via SUBCUTANEOUS
  Administered 2013-05-28: 4 [IU] via SUBCUTANEOUS
  Administered 2013-05-28 (×2): 3 [IU] via SUBCUTANEOUS
  Administered 2013-05-29 (×5): 4 [IU] via SUBCUTANEOUS
  Administered 2013-05-29: 7 [IU] via SUBCUTANEOUS
  Administered 2013-05-30: 4 [IU] via SUBCUTANEOUS
  Administered 2013-05-30: 7 [IU] via SUBCUTANEOUS
  Administered 2013-05-30 (×3): 4 [IU] via SUBCUTANEOUS
  Administered 2013-05-30 – 2013-05-31 (×6): 3 [IU] via SUBCUTANEOUS
  Administered 2013-06-01 (×3): 4 [IU] via SUBCUTANEOUS
  Administered 2013-06-01: 3 [IU] via SUBCUTANEOUS
  Administered 2013-06-01 (×2): 4 [IU] via SUBCUTANEOUS
  Administered 2013-06-02: 7 [IU] via SUBCUTANEOUS
  Administered 2013-06-02 (×3): 4 [IU] via SUBCUTANEOUS
  Administered 2013-06-02: 7 [IU] via SUBCUTANEOUS
  Administered 2013-06-02 – 2013-06-03 (×3): 4 [IU] via SUBCUTANEOUS
  Administered 2013-06-03: 3 [IU] via SUBCUTANEOUS
  Administered 2013-06-03 – 2013-06-05 (×12): 4 [IU] via SUBCUTANEOUS
  Administered 2013-06-05: 3 [IU] via SUBCUTANEOUS
  Administered 2013-06-05 – 2013-06-06 (×3): 4 [IU] via SUBCUTANEOUS
  Administered 2013-06-06: 5 [IU] via SUBCUTANEOUS
  Administered 2013-06-06: 4 [IU] via SUBCUTANEOUS
  Administered 2013-06-06 (×2): 3 [IU] via SUBCUTANEOUS
  Administered 2013-06-06: 4 [IU] via SUBCUTANEOUS
  Administered 2013-06-07: 3 [IU] via SUBCUTANEOUS
  Administered 2013-06-07 (×5): 4 [IU] via SUBCUTANEOUS
  Administered 2013-06-07: 3 [IU] via SUBCUTANEOUS
  Administered 2013-06-08: 4 [IU] via SUBCUTANEOUS
  Administered 2013-06-08: 3 [IU] via SUBCUTANEOUS
  Administered 2013-06-08 – 2013-06-09 (×5): 4 [IU] via SUBCUTANEOUS
  Administered 2013-06-09: 3 [IU] via SUBCUTANEOUS
  Administered 2013-06-09 (×2): 4 [IU] via SUBCUTANEOUS
  Administered 2013-06-09: 3 [IU] via SUBCUTANEOUS
  Administered 2013-06-10 (×4): 4 [IU] via SUBCUTANEOUS
  Administered 2013-06-10: 3 [IU] via SUBCUTANEOUS
  Administered 2013-06-11 (×3): 4 [IU] via SUBCUTANEOUS
  Administered 2013-06-11: 3 [IU] via SUBCUTANEOUS
  Administered 2013-06-12: 4 [IU] via SUBCUTANEOUS
  Administered 2013-06-12: 3 [IU] via SUBCUTANEOUS
  Administered 2013-06-12: 7 [IU] via SUBCUTANEOUS
  Administered 2013-06-12 (×2): 3 [IU] via SUBCUTANEOUS
  Administered 2013-06-13: 4 [IU] via SUBCUTANEOUS
  Administered 2013-06-13: 3 [IU] via SUBCUTANEOUS
  Administered 2013-06-13: 4 [IU] via SUBCUTANEOUS
  Administered 2013-06-13 – 2013-06-14 (×4): 3 [IU] via SUBCUTANEOUS
  Administered 2013-06-14: 4 [IU] via SUBCUTANEOUS
  Administered 2013-06-15: 3 [IU] via SUBCUTANEOUS
  Administered 2013-06-15 (×2): 4 [IU] via SUBCUTANEOUS
  Administered 2013-06-15: 3 [IU] via SUBCUTANEOUS
  Administered 2013-06-15 – 2013-06-16 (×3): 4 [IU] via SUBCUTANEOUS
  Administered 2013-06-16: 3 [IU] via SUBCUTANEOUS
  Administered 2013-06-16 (×2): 4 [IU] via SUBCUTANEOUS
  Administered 2013-06-17: 7 [IU] via SUBCUTANEOUS
  Administered 2013-06-17: 3 [IU] via SUBCUTANEOUS

## 2013-05-18 MED ORDER — POTASSIUM CL IN DEXTROSE 5% 20 MEQ/L IV SOLN
20.0000 meq | INTRAVENOUS | Status: DC
  Administered 2013-05-18 – 2013-05-19 (×3): 20 meq via INTRAVENOUS
  Filled 2013-05-18 (×3): qty 1000

## 2013-05-18 NOTE — Progress Notes (Signed)
PULMONARY  / CRITICAL CARE MEDICINE  Name: Brenda Pope MRN: [id number] DOB: 08/12/1956    ADMISSION DATE:  05/02/2013 CONSULTATION DATE:  7/7/104  REFERRING MD :  Transfer from [facility] PRIMARY SERVICE:  PCCM  CHIEF COMPLAINT:  Status epilepticus  BRIEF PATIENT DESCRIPTION: 57 yo with past medical history of CVAs/TIAs and diastolic heart failure, severe baseline hypertension with noncompliance, poorly controlled diabetes admitted to [facility] on 7/5 with acute dyspnea and suspected CHF exacerbation.  On the day of admission developed developed neurological findings consistent with acute CVA. On 7/6 intubated for airway protection during suspected seizure activity.  Hypotensive post intubation requiring vasopressors. MRI confirmed small acute infarcts of the right parietal lobe. On 7/7 transferred to [facility] for further management.  SIGNIFICANT EVENTS / STUDIES:  7/5  Admitted to [facility] with CHF exacerbation; stroke symptoms 7/6  Suspected seizure activity, intubated for airway protection; hypotensive post intubation requiring vasopressors 7/6  Brain MRI:  Multiple small acute infarcts R parietal lobe, possible small infarcts left basal ganglia and central pons 7/6  TTE:  No source of CVA, EF 55-60% 7/6  Carotid Doppler:  No evidence of hemodynamically significant stenosis 7/7  Transferred to [facility], PCCM serivce 7/7 Neuro consult: "Concern patient is on SE in the context of recent cortical stroke." 7/8 MRI brain: Confluent and scattered right MCA and right PCA acute infarcts, plus scattered small left hemisphere and occasional posterior fossa acute infarcts. The vast majority of these infarcts are newly seen since 05/01/2013.  This appearance suggests embolic phenomena which severely affected more proximal/medium-sized vessels of the right MCA and PCA territories. Cytotoxic edema without significant mass effect at this time. No associated hemorrhage 7/8 EEG: severe diffuse  encephalopathic process with asymmetrical involvement. Findings reported from the left hemisphere were nonspecific. Findings record from the right hemisphere were consistent with acute large area of infarction. No frank epileptiform activity was recorded. 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  7/10 LP performed by Neuro:  7/10 Burst suppression with propofol. Norepi initiated to prevent propofol induced hypotension. Consideration of primary CNS vasculitis. Serologies ordered>> CRP, ESR markedly elevated. High dose methylpred ordered. Insulin gtt ordered while on high dose steriods 7/10 propofol ineffective at burst suppression. pentobarbitol initiated 7/11 CT head: Right frontal lobe, right parietal lobe, right occipital lobe and posterior right temporal lobe acute/subacute infarcts. Surrounding edema with local mass effect. Minimal petechial hemorrhage suspected particularly along the superior margin of the infarct. No gross hemorrhage noted. No midline shift 7/11 angiogram eval for vasculitis: smooth focal areas of narrowing of prox PCAs and distal pericallosal. Subcortical branches, suspicious of vasculitis. 7/14 TEE: no evidence of thrombus 7/20 MRI brain: Overall mildly regressed signal abnormality in the brain associated with multi focal infarcts. The most confluent infarcts  in the right posterior MCA and PCA territories do demonstrate persistent restricted diffusion, but less cytotoxic edema. There is trace petechial hemorrhage, but no malignant hemorrhagic transformation or associated mass effect. Slight extension of a central pontine lacunar infarct (pontine perforator artery territory) without mass effect or hemorrhage 7/21 EEG: severe encephalopathy, nonspecific as to cause. No electrography seizures noted.  7/22 Family indicates that  they do not wish to pursue trach/G-tube and SNF. However, they are not ready to "give up" yet citing their concern that all of the meds used to induced coma might not have fully  washed out of her system yet. I have noted that she developed sever hypernatremia and correcting this might improve her MS. We will continue our support for a couple more days. She will be DNR in event of cardiac arrest  LINES / TUBES: L radial A-line 7/10 >> 7/12 R IJ CVL 7/6 >> 7/21 ETT 7/6 >>     CULTURES: MRSA PCR 7/07 >> POS Blood culture >>ng CSF 7/10 >> ng   ANTIBIOTICS: None  SUBJ:  Remains comatose  VITAL SIGNS: Temp:  [98.3 F (36.8 C)-100.5 F (38.1 C)] 98.3 F (36.8 C) (07/23 0400) Pulse Rate:  [65-76] 75 (07/23 0700) Resp:  [14-19] 16 (07/23 0700) BP: (160-179)/(51-66) 178/59 mmHg (07/23 0700) SpO2:  [96 %-100 %] 100 % (07/23 0700) FiO2 (%):  [40 %] 40 % (07/23 0807)  HEMODYNAMICS:   VENTILATOR SETTINGS: Vent Mode:  [-] PSV;CPAP FiO2 (%):  [40 %] 40 % Set Rate:  [14 bmp] 14 bmp Vt Set:  [500 mL] 500 mL PEEP:  [5 cmH20] 5 cmH20 Pressure Support:  [8 cmH20] 8 cmH20 Plateau Pressure:  [14 cmH20-15 cmH20] 14 cmH20  INTAKE / OUTPUT: Intake/Output     07/22 0701 - 07/23 0700 07/23 0701 - 07/24 0700   I.V. (mL/kg) 2400 (20.8)    NG/GT 1160    Total Intake(mL/kg) 3560 (30.9)    Urine (mL/kg/hr) 2240 (0.8)    Total Output 2240     Net +1320            PHYSICAL EXAMINATION: General: comatose Neuro: no spont movement, no w/d from pain HEENT: PERRL, EOMI Cardiovascular:  RRR s M Lungs: resps even, non labored on vent, few scattered rhonchi  Abdomen:  Soft, nontender, bowel sounds diminished Musculoskeletal: warm, 1-2+ BUE edema, 1+ symmetric BLE edema  LABS: BMET  Recent Labs Lab 05/12/13 0435  05/14/13 0500 05/15/13 0315 05/16/13 0220 05/17/13 0545 05/18/13 0738  NA 151*  < > 159* 162* 162* 157* 151*  K 3.6  < > 3.5 3.4* 3.2* 3.4* 3.6  CL 116*  < > 123* 125* 125*  122* 117*  CO2 27  < > 31 33* 32 32 28  GLUCOSE 170*  < > 185* 146* 129* 238* 275*  BUN 109*  < > 75* 63* 59* 55* 43*  CREATININE 1.44*  < > 0.91 0.82 0.84 0.85 0.75  CALCIUM 8.5  < > 8.5 8.4 8.3* 8.3* 8.0*  MG 2.5  --   --   --   --   --   --   PHOS 3.6  --   --   --   --   --   --   < > = values in this interval not displayed.   CBC  Recent Labs Lab 05/13/13 0323 05/14/13 0500  HGB 10.6* 9.8*  HCT 33.1* 32.1*  WBC 17.8* 15.3*  PLT 251 254    CXR: NNF    ASSESSMENT / PLAN:  PULMONARY A:  Acute respiratory failure due to AMS  Poor cognition prohibits extubation P:   Cont PSV as tolerated Cont vent bundle  CARDIOVASCULAR  A: propofol/pentobarb induced hypotension, resolved H/O CHF - compensated Chronic hypertension P:  Monitor  RENAL A:  Acute on CRI, nonoliguric Hypernatremia  P:   Monitor BMET Correct electrolytes as needed  GASTROINTESTINAL A:  No issues P:   -Cont TFs   HEMATOLOGIC A:  Mild anemia. P:  -Monitor CBC intermittently -On LMWH for DVT prophy   INFECTIOUS A:  No overt infections P:   -Micro and abx as above   ENDOCRINE  A:  DM2. Hyperglycemia. 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 Critical Care Time devoted to patient care services described in this note is 30 minutes.   Billy Fischer, MD ; [facility] [phone].  After 5:30 PM or weekends, call [phone]

## 2013-05-18 NOTE — Progress Notes (Signed)
Respiratory therapy note-sutures removed by Dr. Sung Amabile, patient is currently weaning on ventilator, tolerating well.

## 2013-05-19 ENCOUNTER — Inpatient Hospital Stay (HOSPITAL_COMMUNITY): Payer: Medicaid Other

## 2013-05-19 DIAGNOSIS — E119 Type 2 diabetes mellitus without complications: Secondary | ICD-10-CM

## 2013-05-19 LAB — GLUCOSE, CAPILLARY
Glucose-Capillary: 201 mg/dL — ABNORMAL HIGH (ref 70–99)
Glucose-Capillary: 205 mg/dL — ABNORMAL HIGH (ref 70–99)

## 2013-05-19 LAB — CBC
HCT: 27.3 % — ABNORMAL LOW (ref 36.0–46.0)
MCH: 29.9 pg (ref 26.0–34.0)
MCHC: 31.9 g/dL (ref 30.0–36.0)
MCV: 93.8 fL (ref 78.0–100.0)
Platelets: 212 10*3/uL (ref 150–400)
RDW: 12.2 % (ref 11.5–15.5)
WBC: 10.9 10*3/uL — ABNORMAL HIGH (ref 4.0–10.5)

## 2013-05-19 LAB — BASIC METABOLIC PANEL
BUN: 43 mg/dL — ABNORMAL HIGH (ref 6–23)
CO2: 29 mEq/L (ref 19–32)
Calcium: 8.2 mg/dL — ABNORMAL LOW (ref 8.4–10.5)
Chloride: 116 mEq/L — ABNORMAL HIGH (ref 96–112)
Creatinine, Ser: 0.74 mg/dL (ref 0.50–1.10)
Glucose, Bld: 185 mg/dL — ABNORMAL HIGH (ref 70–99)

## 2013-05-19 MED ORDER — POTASSIUM CHLORIDE 20 MEQ/15ML (10%) PO LIQD
40.0000 meq | Freq: Once | ORAL | Status: AC
  Administered 2013-05-19: 40 meq
  Filled 2013-05-19: qty 30

## 2013-05-19 MED ORDER — POTASSIUM CL IN DEXTROSE 5% 20 MEQ/L IV SOLN
20.0000 meq | INTRAVENOUS | Status: DC
  Administered 2013-05-20: 20 meq via INTRAVENOUS
  Filled 2013-05-19 (×2): qty 1000

## 2013-05-19 NOTE — Progress Notes (Signed)
Subjective: No improvement in patient's level of responsiveness reported. No recurrent seizure activity reported.  Objective: Current vital signs: BP 183/61  Pulse 80  Temp(Src) 100 F (37.8 C) (Axillary)  Resp 18  Ht 6' (1.829 m)  Wt 115.3 kg (254 lb 3.1 oz)  BMI 34.47 kg/m2  SpO2 100%  Neurologic Exam: Patient is intubated and on mechanical ventilation. She was on no sedating medications. Patient responded to noxious stimuli with partial eye-opening only. She had gaze to the right side with no purposeful eye movements. Pupils were equal and reacted normally to light. Extraocular movements were intact to oculocephalic maneuvers. No facial weakness was noted. Muscle tone was flaccid throughout. Patient had no abnormal posturing of extremities. She had no withdrawal movements to noxious stimuli. Deep tendon reflexes were absent in lower extremities. Plantar responses were mute bilaterally.  Lab Results: Results for orders placed during the hospital encounter of 05/02/13 (from the past 48 hour(s))  GLUCOSE, CAPILLARY     Status: Abnormal   Collection Time    05/17/13 12:07 PM      Result Value Range   Glucose-Capillary 221 (*) 70 - 99 mg/dL  GLUCOSE, CAPILLARY     Status: Abnormal   Collection Time    05/17/13  3:37 PM      Result Value Range   Glucose-Capillary 256 (*) 70 - 99 mg/dL   Comment 1 Notify RN     Comment 2 Documented in Chart    GLUCOSE, CAPILLARY     Status: Abnormal   Collection Time    05/17/13  7:47 PM      Result Value Range   Glucose-Capillary 224 (*) 70 - 99 mg/dL   Comment 1 Notify RN     Comment 2 Documented in Chart    GLUCOSE, CAPILLARY     Status: Abnormal   Collection Time    05/18/13 12:04 AM      Result Value Range   Glucose-Capillary 265 (*) 70 - 99 mg/dL  GLUCOSE, CAPILLARY     Status: Abnormal   Collection Time    05/18/13  4:20 AM      Result Value Range   Glucose-Capillary 239 (*) 70 - 99 mg/dL   Comment 1 Notify RN     COMPREHENSIVE METABOLIC PANEL     Status: Abnormal   Collection Time    05/18/13  7:38 AM      Result Value Range   Sodium 151 (*) 135 - 145 mEq/L   Potassium 3.6  3.5 - 5.1 mEq/L   Chloride 117 (*) 96 - 112 mEq/L   CO2 28  19 - 32 mEq/L   Glucose, Bld 275 (*) 70 - 99 mg/dL   BUN 43 (*) 6 - 23 mg/dL   Creatinine, Ser 1.61  0.50 - 1.10 mg/dL   Calcium 8.0 (*) 8.4 - 10.5 mg/dL   Total Protein 5.0 (*) 6.0 - 8.3 g/dL   Albumin 1.6 (*) 3.5 - 5.2 g/dL   AST 56 (*) 0 - 37 U/L   ALT 155 (*) 0 - 35 U/L   Alkaline Phosphatase 146 (*) 39 - 117 U/L   Total Bilirubin 0.2 (*) 0.3 - 1.2 mg/dL   GFR calc non Af Amer >90  >90 mL/min   GFR calc Af Amer >90  >90 mL/min   Comment:            The eGFR has been calculated     using the CKD EPI equation.  This calculation has not been     validated in all clinical     situations.     eGFR's persistently     <90 mL/min signify     possible Chronic Kidney Disease.  GLUCOSE, CAPILLARY     Status: Abnormal   Collection Time    05/18/13  8:10 AM      Result Value Range   Glucose-Capillary 245 (*) 70 - 99 mg/dL  GLUCOSE, CAPILLARY     Status: Abnormal   Collection Time    05/18/13 11:48 AM      Result Value Range   Glucose-Capillary 273 (*) 70 - 99 mg/dL  GLUCOSE, CAPILLARY     Status: Abnormal   Collection Time    05/18/13  4:48 PM      Result Value Range   Glucose-Capillary 235 (*) 70 - 99 mg/dL   Comment 1 Notify RN     Comment 2 Documented in Chart    GLUCOSE, CAPILLARY     Status: Abnormal   Collection Time    05/18/13  7:40 PM      Result Value Range   Glucose-Capillary 221 (*) 70 - 99 mg/dL   Comment 1 Notify RN     Comment 2 Documented in Chart    GLUCOSE, CAPILLARY     Status: Abnormal   Collection Time    05/19/13 12:13 AM      Result Value Range   Glucose-Capillary 232 (*) 70 - 99 mg/dL  GLUCOSE, CAPILLARY     Status: Abnormal   Collection Time    05/19/13  4:18 AM      Result Value Range   Glucose-Capillary 201 (*)  70 - 99 mg/dL   Comment 1 Notify RN    BASIC METABOLIC PANEL     Status: Abnormal   Collection Time    05/19/13  5:40 AM      Result Value Range   Sodium 150 (*) 135 - 145 mEq/L   Potassium 3.3 (*) 3.5 - 5.1 mEq/L   Chloride 116 (*) 96 - 112 mEq/L   CO2 29  19 - 32 mEq/L   Glucose, Bld 185 (*) 70 - 99 mg/dL   BUN 43 (*) 6 - 23 mg/dL   Creatinine, Ser 1.61  0.50 - 1.10 mg/dL   Calcium 8.2 (*) 8.4 - 10.5 mg/dL   GFR calc non Af Amer >90  >90 mL/min   GFR calc Af Amer >90  >90 mL/min   Comment:            The eGFR has been calculated     using the CKD EPI equation.     This calculation has not been     validated in all clinical     situations.     eGFR's persistently     <90 mL/min signify     possible Chronic Kidney Disease.  CBC     Status: Abnormal   Collection Time    05/19/13  5:40 AM      Result Value Range   WBC 10.9 (*) 4.0 - 10.5 K/uL   RBC 2.91 (*) 3.87 - 5.11 MIL/uL   Hemoglobin 8.7 (*) 12.0 - 15.0 g/dL   HCT 09.6 (*) 04.5 - 40.9 %   MCV 93.8  78.0 - 100.0 fL   MCH 29.9  26.0 - 34.0 pg   MCHC 31.9  30.0 - 36.0 g/dL   RDW 81.1  91.4 - 78.2 %   Platelets 212  150 - 400 K/uL  GLUCOSE, CAPILLARY     Status: Abnormal   Collection Time    05/19/13  8:26 AM      Result Value Range   Glucose-Capillary 162 (*) 70 - 99 mg/dL    Studies/Results: Dg Chest Port 1 View  05/19/2013   *RADIOLOGY REPORT*  Clinical Data: Respiratory failure, follow-up  PORTABLE CHEST - 1 VIEW  Comparison: Portable chest x-ray of 05/14/2013  Findings: The tip of the endotracheal tube remains approximately 5.2 cm above the carina.  The lungs appear well aerated.  Mild cardiomegaly is stable.  IMPRESSION: Slightly better aeration.  No change in position of endotracheal tube.   Original Report Authenticated By: Dwyane Dee, M.D.    Medications: I have reviewed the patient's current medications.  Assessment/Plan: Encephalopathic state of unclear etiology but likely multifactorial. Patient has had  multiple episodes of hypotension in addition to multiple bilateral cerebral infarctions, the most prominent of which involves the right middle cerebral artery territory. Patient also has had status epilepticus which has resolved. She's on combination of Dilantin, Keppra and Vimpat. She's on no sedating medications at this point in addition no improvement in level of responsiveness, remaining comatosed.  Prognosis for recovery to any meaningful functional level is extremely poor, as patient is likely to have severe disabling deficits associated with prolonged diffuse encephalopathy, as well as multiple cerebral infarctions. It's likely that she would need SNF level of care continuously, if she survives current illnesses. Prognosis was discussed with patient's spouse.  No further specific neurologic intervention is indicated. We will continue to follow this patient on an as needed basis.  C.R. Roseanne Reno, MD [facility] [phone]  05/19/2013  11:48 AM

## 2013-05-19 NOTE — Progress Notes (Signed)
PULMONARY  / CRITICAL CARE MEDICINE  Name: Brenda Pope MRN: [id number] DOB: 11-04-1955    ADMISSION DATE:  05/02/2013 CONSULTATION DATE:  7/7/104  REFERRING MD :  Transfer from [facility] PRIMARY SERVICE:  PCCM  CHIEF COMPLAINT:  Status epilepticus  BRIEF PATIENT DESCRIPTION: 57 yo with past medical history of CVAs/TIAs and diastolic heart failure, severe baseline hypertension with noncompliance, poorly controlled diabetes admitted to [facility] on 7/5 with acute dyspnea and suspected CHF exacerbation.  On the day of admission developed developed neurological findings consistent with acute CVA. On 7/6 intubated for airway protection during suspected seizure activity.  Hypotensive post intubation requiring vasopressors. MRI confirmed small acute infarcts of the right parietal lobe. On 7/7 transferred to [facility] for further management.  SIGNIFICANT EVENTS / STUDIES:  7/5  Admitted to [facility] with CHF exacerbation; stroke symptoms 7/6  Suspected seizure activity, intubated for airway protection; hypotensive post intubation requiring vasopressors 7/6  Brain MRI:  Multiple small acute infarcts R parietal lobe, possible small infarcts left basal ganglia and central pons 7/6  TTE:  No source of CVA, EF 55-60% 7/6  Carotid Doppler:  No evidence of hemodynamically significant stenosis 7/7  Transferred to [facility], PCCM serivce 7/7 Neuro consult: "Concern patient is on SE in the context of recent cortical stroke." 7/8 MRI brain: Confluent and scattered right MCA and right PCA acute infarcts, plus scattered small left hemisphere and occasional posterior fossa acute infarcts. The vast majority of these infarcts are newly seen since 05/01/2013.  This appearance suggests embolic phenomena which severely affected more proximal/medium-sized vessels of the right MCA and PCA territories. Cytotoxic edema without significant mass effect at this time. No associated hemorrhage 7/8 EEG: severe diffuse  encephalopathic process with asymmetrical involvement. Findings reported from the left hemisphere were nonspecific. Findings record from the right hemisphere were consistent with acute large area of infarction. No frank epileptiform activity was recorded. 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  7/10 LP performed by Neuro:  7/10 Burst suppression with propofol. Norepi initiated to prevent propofol induced hypotension. Consideration of primary CNS vasculitis. Serologies ordered>> CRP, ESR markedly elevated. High dose methylpred ordered. Insulin gtt ordered while on high dose steriods 7/10 propofol ineffective at burst suppression. pentobarbitol initiated 7/11 CT head: Right frontal lobe, right parietal lobe, right occipital lobe and posterior right temporal lobe acute/subacute infarcts. Surrounding edema with local mass effect. Minimal petechial hemorrhage suspected particularly along the superior margin of the infarct. No gross hemorrhage noted. No midline shift 7/11 angiogram eval for vasculitis: smooth focal areas of narrowing of prox PCAs and distal pericallosal. Subcortical branches, suspicious of vasculitis. 7/14 TEE: no evidence of thrombus 7/20 MRI brain: Overall mildly regressed signal abnormality in the brain associated with multi focal infarcts. The most confluent infarcts  in the right posterior MCA and PCA territories do demonstrate persistent restricted diffusion, but less cytotoxic edema. There is trace petechial hemorrhage, but no malignant hemorrhagic transformation or associated mass effect. Slight extension of a central pontine lacunar infarct (pontine perforator artery territory) without mass effect or hemorrhage 7/21 EEG: severe encephalopathy, nonspecific as to cause. No electrography seizures noted.  7/22 Family indicates that  they do not wish to pursue trach/G-tube and SNF. However, they are not ready to "give up" yet citing their concern that all of the meds used to induced coma might not have fully  washed out of her system yet. I have noted that she developed sever hypernatremia and correcting this might improve her MS. We will continue our support for a couple more days. She will be DNR in event of cardiac arrest 7/24 Family plans on one way extubation when daughter arrives back in town 7/25 or 7/26   LINES / TUBES: L radial A-line 7/10 >> 7/12 R IJ CVL 7/6 >> 7/21 ETT 7/6 >>     CULTURES: MRSA PCR 7/07 >> POS Blood culture >>ng CSF 7/10 >> ng   ANTIBIOTICS: None  SUBJ:  Remains comatose. Spont cough. Occasional withdrawal.   VITAL SIGNS: Temp:  [99.5 F (37.5 C)-100 F (37.8 C)] 100 F (37.8 C) (07/24 0800) Pulse Rate:  [71-81] 78 (07/24 1248) Resp:  [14-23] 18 (07/24 1248) BP: (97-183)/(46-67) 179/53 mmHg (07/24 1248) SpO2:  [95 %-100 %] 99 % (07/24 1248) FiO2 (%):  [40 %] 40 % (07/24 1248)  HEMODYNAMICS:   VENTILATOR SETTINGS: Vent Mode:  [-] PSV FiO2 (%):  [40 %] 40 % Set Rate:  [14 bmp] 14 bmp Vt Set:  [500 mL] 500 mL PEEP:  [5 cmH20] 5 cmH20 Pressure Support:  [8 cmH20-10 cmH20] 10 cmH20 Plateau Pressure:  [15 cmH20] 15 cmH20  INTAKE / OUTPUT: Intake/Output     07/23 0701 - 07/24 0700 07/24 0701 - 07/25 0700   I.V. (mL/kg) 1800 (15.6) 75 (0.7)   NG/GT 1280 180   Total Intake(mL/kg) 3080 (26.7) 255 (2.2)   Urine (mL/kg/hr) 2220 (0.8) 355 (0.5)   Total Output 2220 355   Net +860 -100        Stool Occurrence 3 x      PHYSICAL EXAMINATION: General: comatose Neuro: Remains comatose. Spont cough. Occasional withdrawal. HEENT: PERRL, EOMI Cardiovascular:  RRR s M Lungs: resps even, non labored on vent, few scattered rhonchi  Abdomen:  Soft, nontender, bowel sounds diminished Musculoskeletal: warm, 1-2+ BUE edema, 1+ symmetric BLE edema  LABS: BMET  Recent Labs Lab  05/15/13 0315 05/16/13 0220 05/17/13 0545 05/18/13 0738 05/19/13 0540  NA 162* 162* 157* 151* 150*  K 3.4* 3.2* 3.4* 3.6 3.3*  CL 125* 125* 122* 117* 116*  CO2 33* 32 32 28 29  GLUCOSE 146* 129* 238* 275* 185*  BUN 63* 59* 55* 43* 43*  CREATININE 0.82 0.84 0.85 0.75 0.74  CALCIUM 8.4 8.3* 8.3* 8.0* 8.2*     CBC  Recent Labs Lab 05/13/13 0323 05/14/13 0500 05/19/13 0540  HGB 10.6* 9.8* 8.7*  HCT 33.1* 32.1* 27.3*  WBC 17.8* 15.3* 10.9*  PLT 251 254 212    CXR: NNF    ASSESSMENT / PLAN:  PULMONARY A:  Acute respiratory failure due to AMS  Poor cognition prohibits extubation P:   Cont PSV as tolerated Cont vent bundle Plan one way extubation once daughter returns to town  CARDIOVASCULAR  A: propofol/pentobarb induced hypotension, resolved H/O CHF - compensated Chronic hypertension P:  Monitor  RENAL A:  Acute on CRI, nonoliguric Hypernatremia  P:   Monitor BMET Correct electrolytes as needed  GASTROINTESTINAL A:  No issues P:   -Cont TFs   HEMATOLOGIC A:  Mild anemia. P:  -Monitor CBC intermittently -On LMWH for DVT prophy   INFECTIOUS A:  No overt infections P:   -Micro and abx as above   ENDOCRINE  A:  DM2. Hyperglycemia. P:   -Cont lantus, SSI   NEUROLOGIC A:  Acute CVA in multiple territories, unclear etiology Persistent coma Status epilepticus  Concern for CNS vasculitis-received pulse dose steroids P:   -Eval and mgmt per Neuro. Prognosis deemed poor   Plan one way extubation 7/25 or 7/26 after daughter returns to town. Will have NGT placed and cont medical therapies including nutrtition, IVFs, anti-convulsants after extubation     Billy Fischer, MD ; [facility] service Mobile [phone].  After 5:30 PM or weekends, call [phone]

## 2013-05-20 ENCOUNTER — Inpatient Hospital Stay (HOSPITAL_COMMUNITY): Payer: Medicaid Other

## 2013-05-20 LAB — GLUCOSE, CAPILLARY
Glucose-Capillary: 180 mg/dL — ABNORMAL HIGH (ref 70–99)
Glucose-Capillary: 197 mg/dL — ABNORMAL HIGH (ref 70–99)
Glucose-Capillary: 209 mg/dL — ABNORMAL HIGH (ref 70–99)
Glucose-Capillary: 245 mg/dL — ABNORMAL HIGH (ref 70–99)

## 2013-05-20 MED ORDER — POTASSIUM CL IN DEXTROSE 5% 20 MEQ/L IV SOLN
20.0000 meq | INTRAVENOUS | Status: DC
  Administered 2013-05-21 – 2013-05-28 (×3): 20 meq via INTRAVENOUS
  Filled 2013-05-20 (×11): qty 1000

## 2013-05-20 MED ORDER — FREE WATER
200.0000 mL | Freq: Four times a day (QID) | Status: DC
  Administered 2013-05-20 – 2013-06-05 (×58): 200 mL

## 2013-05-20 NOTE — Progress Notes (Signed)
PULMONARY  / CRITICAL CARE MEDICINE  Name: Brenda Pope MRN: [id number] DOB: December 02, 1955    ADMISSION DATE:  05/02/2013 CONSULTATION DATE:  7/7/104  REFERRING MD :  Transfer from [facility] PRIMARY SERVICE:  PCCM  CHIEF COMPLAINT:  Status epilepticus  BRIEF PATIENT DESCRIPTION: 57 yo with past medical history of CVAs/TIAs and diastolic heart failure, severe baseline hypertension with noncompliance, poorly controlled diabetes admitted to [facility] on 7/5 with acute dyspnea and suspected CHF exacerbation.  On the day of admission developed developed neurological findings consistent with acute CVA. On 7/6 intubated for airway protection during suspected seizure activity.  Hypotensive post intubation requiring vasopressors. MRI confirmed small acute infarcts of the right parietal lobe. On 7/7 transferred to [facility] for further management.  SIGNIFICANT EVENTS / STUDIES:  7/5  [facility] with CHF exacerbation; stroke symptoms 7/6  Suspected seizure activity, intubated for airway protection; hypotensive post intubation requiring vasopressors 7/6  Brain MRI:  Multiple small acute infarcts R parietal lobe, possible small infarcts left basal ganglia and central pons 7/6  TTE:  No source of CVA, EF 55-60% 7/6  Carotid Doppler:  No evidence of hemodynamically significant stenosis 7/7  Transferred to [facility], PCCM serivce 7/7 Neuro consult: "Concern patient is on SE in the context of recent cortical stroke." 7/8 MRI brain: Confluent and scattered right MCA and right PCA acute infarcts, plus scattered small left hemisphere and occasional posterior fossa acute infarcts. The vast majority of these infarcts are newly seen since 05/01/2013.  This appearance suggests embolic phenomena which severely affected more proximal/medium-sized vessels of the right MCA and PCA territories. Cytotoxic edema without significant mass effect at this time. No associated hemorrhage 7/8 EEG: severe diffuse  encephalopathic process with asymmetrical involvement. Findings reported from the left hemisphere were nonspecific. Findings record from the right hemisphere were consistent with acute large area of infarction. No frank epileptiform activity was recorded. 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  7/10 LP performed by Neuro:  7/10 Burst suppression with propofol. Norepi initiated to prevent propofol induced hypotension. Consideration of primary CNS vasculitis. Serologies ordered>> CRP, ESR markedly elevated. High dose methylpred ordered. Insulin gtt ordered while on high dose steriods 7/10 propofol ineffective at burst suppression. pentobarbitol initiated 7/11 CT head: Right frontal lobe, right parietal lobe, right occipital lobe and posterior right temporal lobe acute/subacute infarcts. Surrounding edema with local mass effect. Minimal petechial hemorrhage suspected particularly along the superior margin of the infarct. No gross hemorrhage noted. No midline shift 7/11 angiogram eval for vasculitis: smooth focal areas of narrowing of prox PCAs and distal pericallosal. Subcortical branches, suspicious of vasculitis. 7/14 TEE: no evidence of thrombus 7/20 MRI brain: Overall mildly regressed signal abnormality in the brain associated with multi focal infarcts. The most confluent infarcts  in the right posterior MCA and PCA territories do demonstrate persistent restricted diffusion, but less cytotoxic edema. There is trace petechial hemorrhage, but no malignant hemorrhagic transformation or associated mass effect. Slight extension of a central pontine lacunar infarct (pontine perforator artery territory) without mass effect or hemorrhage 7/21 EEG: severe encephalopathy, nonspecific as to cause. No electrography seizures noted.  7/22 Family indicates that  they do not wish to pursue trach/G-tube and SNF. However, they are not ready to "give up" yet citing their concern that all of the meds used to induced coma might not have fully  washed out of her system yet. I have noted that she developed sever hypernatremia and correcting this might improve her MS. We will continue our support for a couple more days. She will be DNR in event of cardiac arrest 7/24 Family plans on one way extubation when daughter arrives back in town 7/25 or 7/26   LINES / TUBES: L radial A-line 7/10 >> 7/12 R IJ CVL 7/6 >> 7/21 ETT 7/6 >>     CULTURES: MRSA PCR 7/07 >> POS Blood culture >>ng CSF 7/10 >> ng   ANTIBIOTICS: None  SUBJ:  Partial eye opening to voice. Not F/C. No withdrawal from pain  VITAL SIGNS: Temp:  [99 F (37.2 C)-99.5 F (37.5 C)] 99 F (37.2 C) (07/25 0409) Pulse Rate:  [68-79] 75 (07/25 0805) Resp:  [10-19] 15 (07/25 0805) BP: (105-184)/(46-152) 184/152 mmHg (07/25 0805) SpO2:  [99 %-100 %] 100 % (07/25 0805) FiO2 (%):  [40 %] 40 % (07/25 1200)  HEMODYNAMICS:   VENTILATOR SETTINGS: Vent Mode:  [-] PSV;CPAP FiO2 (%):  [40 %] 40 % Set Rate:  [14 bmp] 14 bmp Vt Set:  [500 mL] 500 mL PEEP:  [5 cmH20] 5 cmH20 Pressure Support:  [10 cmH20] 10 cmH20 Plateau Pressure:  [14 cmH20-15 cmH20] 15 cmH20  INTAKE / OUTPUT: Intake/Output     07/24 0701 - 07/25 0700 07/25 0701 - 07/26 0700   I.V. (mL/kg) 1038.3 (9)    NG/GT 1680 425   Total Intake(mL/kg) 2718.3 (23.6) 425 (3.7)   Urine (mL/kg/hr) 2180 (0.8)    Total Output 2180     Net +538.3 +425        Stool Occurrence 2 x      PHYSICAL EXAMINATION: General: minimally responsive Neuro: Remains comatose. Spont cough. Partial eye opening HEENT: PERRL, EOMI Cardiovascular:  RRR s M Lungs: resps even, non labored on vent, few scattered rhonchi  Abdomen:  Soft, nontender, bowel sounds diminished Musculoskeletal: warm, 1-2+ BUE edema, 1+ symmetric BLE edema  LABS: BMET  Recent  Labs Lab 05/15/13 0315 05/16/13 0220 05/17/13 0545 05/18/13 0738 05/19/13 0540  NA 162* 162* 157* 151* 150*  K 3.4* 3.2* 3.4* 3.6 3.3*  CL 125* 125* 122* 117* 116*  CO2 33* 32 32 28 29  GLUCOSE 146* 129* 238* 275* 185*  BUN 63* 59* 55* 43* 43*  CREATININE 0.82 0.84 0.85 0.75 0.74  CALCIUM 8.4 8.3* 8.3* 8.0* 8.2*     CBC  Recent Labs Lab 05/14/13 0500 05/19/13 0540  HGB 9.8* 8.7*  HCT 32.1* 27.3*  WBC 15.3* 10.9*  PLT 254 212    CXR: NNF    ASSESSMENT / PLAN:  PULMONARY A:  Acute respiratory failure due to AMS  Poor cognition prohibits extubation P:   Cont PSV as tolerated Cont vent bundle Plan one way extubation once daughter returns to town  CARDIOVASCULAR  A: propofol/pentobarb induced hypotension, resolved H/O CHF - compensated Chronic hypertension P:  Monitor  RENAL A:  Acute on CRI, nonoliguric Hypernatremia  P:   Monitor BMET Correct electrolytes as needed  GASTROINTESTINAL A:  No issues P:   -Cont TFs   HEMATOLOGIC A:  Mild anemia. P:  -Monitor CBC intermittently -On LMWH for DVT prophy   INFECTIOUS A:  No overt infections P:   -Micro and abx as above   ENDOCRINE  A:  DM2. Hyperglycemia. P:   -Cont lantus, SSI   NEUROLOGIC A:  Acute CVA in multiple territories, unclear etiology Persistent coma Status epilepticus Concern  for CNS vasculitis-received pulse dose steroids P:   -Eval and mgmt per Neuro. Prognosis deemed poor   Plan one way extubation 7/25 or 7/26 after daughter returns to town. Will have NGT placed and cont medical therapies including nutrtition, IVFs, anti-convulsants after extubation. Will meet with husband to clarify timing     Billy Fischer, MD ; [facility] service Mobile [phone].  After 5:30 PM or weekends, call [phone]

## 2013-05-21 LAB — BASIC METABOLIC PANEL
BUN: 41 mg/dL — ABNORMAL HIGH (ref 6–23)
CO2: 30 mEq/L (ref 19–32)
Calcium: 8.3 mg/dL — ABNORMAL LOW (ref 8.4–10.5)
Chloride: 113 mEq/L — ABNORMAL HIGH (ref 96–112)
Creatinine, Ser: 0.68 mg/dL (ref 0.50–1.10)
GFR calc Af Amer: >90 mL/min (ref >90)

## 2013-05-21 LAB — GLUCOSE, CAPILLARY: Glucose-Capillary: 178 mg/dL — ABNORMAL HIGH (ref 70–99)

## 2013-05-21 NOTE — Progress Notes (Signed)
PULMONARY  / CRITICAL CARE MEDICINE  Name: Kanoelani Dobies MRN: [id number] DOB: 22-Aug-1956    ADMISSION DATE:  05/02/2013 CONSULTATION DATE:  7/7/104  REFERRING MD :  Transfer from [facility] PRIMARY SERVICE:  PCCM  CHIEF COMPLAINT:  Status epilepticus  BRIEF PATIENT DESCRIPTION: 57 y/o with past medical history of CVAs/TIAs and diastolic heart failure, severe baseline hypertension with noncompliance, poorly controlled diabetes admitted to [facility] on 7/5 with acute dyspnea and suspected CHF exacerbation.  On the day of admission developed developed neurological findings consistent with acute CVA. 7/6 intubated for airway protection during suspected seizure activity.  Hypotensive post intubation requiring vasopressors. MRI confirmed small acute infarcts of the right parietal lobe. On 7/7 transferred to [facility] for further management.  SIGNIFICANT EVENTS / STUDIES:  7/05 -  Admitted to [facility] with CHF exacerbation; stroke symptoms 7/06 - Suspected seizure activity, intubated for airway protection; hypotensive post intubation requiring vasopressors 7/06 -  Brain MRI:  Multiple small acute infarcts R parietal lobe, possible small infarcts left basal ganglia and central pons 7/06 - TTE:  No source of CVA, EF 55-60% 7/06 - Carotid Doppler:  No evidence of hemodynamically significant stenosis 7/07 - Transferred to [facility], PCCM serivce 7/07 - Neuro consult: "Concern patient is on SE in the context of recent cortical stroke." 7/08 - MRI brain: Confluent and scattered right MCA and right PCA acute infarcts, plus scattered small left hemisphere and occasional posterior fossa acute infarcts. The vast majority of these infarcts are newly seen since 05/01/2013.  This appearance suggests embolic phenomena which severely affected more proximal/medium-sized vessels of the right MCA and PCA territories. Cytotoxic edema without significant mass effect at this time. No associated hemorrhage 7/08 -  EEG: severe diffuse encephalopathic process with asymmetrical involvement. Findings reported from the left hemisphere were nonspecific. Findings record from the right hemisphere were consistent with acute large area of infarction. No frank epileptiform activity was recorded. 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  7/10 - LP performed by Neuro:  7/10 - Burst suppression with propofol. Norepi initiated to prevent propofol induced hypotension. Consideration of primary CNS vasculitis. Serologies ordered>> CRP, ESR markedly elevated. High dose methylpred ordered. Insulin gtt ordered while on high dose steriods 7/10 - propofol ineffective at burst suppression. pentobarbitol initiated 7/11 - CT head: Right frontal lobe, right parietal lobe, right occipital lobe and posterior right temporal lobe acute/subacute infarcts. Surrounding edema with local mass effect. Minimal petechial hemorrhage suspected particularly along the superior margin of the infarct. No gross hemorrhage noted. No midline shift 7/11 - angiogram eval for vasculitis: smooth focal areas of narrowing of prox PCAs and distal pericallosal. Subcortical branches, suspicious of vasculitis. 7/14 - TEE: no evidence of thrombus 7/20 - MRI brain: Overall mildly regressed signal abnormality in the brain associated with multi focal infarcts. The most confluent infarcts in the right posterior MCA and PCA territories do demonstrate persistent restricted diffusion, but less cytotoxic edema. There is trace petechial hemorrhage, but no malignant hemorrhagic transformation or associated mass effect. Slight extension of a central pontine lacunar infarct (pontine perforator artery territory) without mass effect or hemorrhage 7/21 - EEG: severe encephalopathy, nonspecific as to cause. No electrography seizures  noted.  7/22 - Family indicates that they do not wish to pursue trach/G-tube and SNF. However, they are not ready to "give up" yet citing their concern  that all of the meds used to induced coma might not have fully washed out of her system yet. I have noted that she developed severe hypernatremia and correcting this might improve her MS. We will continue our support for a couple more days. She will be DNR in event of cardiac arrest 7/24 - Family plans on one way extubation when daughter arrives back in town 7/25 or 7/26   LINES / TUBES: L radial A-line 7/10 >> 7/12 R IJ CVL 7/6 >> 7/21 ETT 7/6 >>     CULTURES: MRSA PCR 7/07 >> POS Blood culture >>ng CSF 7/10 >> ng   ANTIBIOTICS: None  SUBJ:    VITAL SIGNS: Temp:  [98 F (36.7 C)-100 F (37.8 C)] 99.6 F (37.6 C) (07/26 0417) Pulse Rate:  [65-79] 76 (07/26 0700) Resp:  [14-19] 16 (07/26 0700) BP: (141-176)/(48-64) 164/61 mmHg (07/26 0700) SpO2:  [99 %-100 %] 100 % (07/26 0700) FiO2 (%):  [39.8 %-40.3 %] 40.3 % (07/26 0700) Weight:  [272 lb 0.8 oz (123.4 kg)] 272 lb 0.8 oz (123.4 kg) (07/26 0600)  HEMODYNAMICS:   VENTILATOR SETTINGS: Vent Mode:  [-] PRVC FiO2 (%):  [39.8 %-40.3 %] 40.3 % Set Rate:  [14 bmp] 14 bmp Vt Set:  [500 mL] 500 mL PEEP:  [5 cmH20] 5 cmH20 Pressure Support:  [10 cmH20] 10 cmH20 Plateau Pressure:  [14 cmH20-16 cmH20] 16 cmH20  INTAKE / OUTPUT: Intake/Output     07/25 0701 - 07/26 0700 07/26 0701 - 07/27 0700   I.V. (mL/kg) 590 (4.8) 20 (0.2)   NG/GT 1880 45   Total Intake(mL/kg) 2470 (20) 65 (0.5)   Urine (mL/kg/hr) 2855 (1)    Total Output 2855     Net -385 +65          PHYSICAL EXAMINATION: General: chronically ill on vent Neuro: Remains comatose. Spont cough. Partial eye opening, noted downward movt of R thumb on sisters command, one squeeze on hand on R but will not repeat  HEENT: PERRL, EOMI Cardiovascular:  RRR s M Lungs: resps even, non labored on vent, few scattered rhonchi   Abdomen:  Soft, nontender, bowel sounds diminished Musculoskeletal: warm, 1-2+ BUE edema, 1+ symmetric BLE edema  LABS: BMET  Recent Labs Lab 05/16/13 0220 05/17/13 0545 05/18/13 0738 05/19/13 0540 05/21/13 0545  NA 162* 157* 151* 150* 148*  K 3.2* 3.4* 3.6 3.3* 3.6  CL 125* 122* 117* 116* 113*  CO2 32 32 28 29 30   GLUCOSE 129* 238* 275* 185* 211*  BUN 59* 55* 43* 43* 41*  CREATININE 0.84 0.85 0.75 0.74 0.68  CALCIUM 8.3* 8.3* 8.0* 8.2* 8.3*     CBC  Recent Labs Lab 05/19/13 0540  HGB 8.7*  HCT 27.3*  WBC 10.9*  PLT 212    CXR: NNF    ASSESSMENT / PLAN:  PULMONARY A:   Acute respiratory failure due to AMS - Poor cognition prohibits extubation P:   Cont PSV as tolerated Cont vent bundle   CARDIOVASCULAR  A:  Propofol/pentobarb induced hypotension - resolved H/O CHF - compensated Chronic hypertension P:  Monitor  RENAL A:   Acute on CRI - nonoliguric Hypernatremia P:   Monitor BMET Correct electrolytes as needed  GASTROINTESTINAL A:  No issues P:   -Cont TFs   HEMATOLOGIC A:  Mild anemia. P:  -Monitor CBC intermittently -On LMWH for DVT prophy   INFECTIOUS A:  No overt infections P:   -Micro and abx as above   ENDOCRINE  A:  DM2. Hyperglycemia. Diabetic Neuropathy - very severe, to point she had stepped on a tack and walked with it in her foot for 3 days before she realized it was there P:   -Cont lantus, SSI   NEUROLOGIC A:   Acute CVA in multiple territories, unclear etiology Persistent coma Status epilepticus Concern for CNS vasculitis-received pulse dose steroids P:   -Eval and mgmt per Neuro. Prognosis deemed poor   7/26: Extensive discussion with husband, sister and two brothers this am.  They have a very close family.  Reviewed current state of health and previous health status with family.  She was essentially motorized chair bound prior to admit, minimal ambulation at home in house.  They feel if she could  achieve a state of awareness of family that she would accept any physical state that accompanied it.  However, they do not feel she would want to live on machines without awareness of surroundings.  She has had some movement of R hand to command ???if reflex vs purposeful as can not get her to repeat specific movements.  Even with the development of movement in hand, her prognosis for meaningful recovery seems very poor.  Family agrees DNR in the event of cardiac arrest.    Canary Brim, NP-C [facility] Pgr: [phone] or [phone]  If family wants to press forward, would advise tstomy early next week  The patient is critically ill with multiple organ systems failure and requires high complexity decision making for assessment and support, frequent evaluation and titration of therapies, application of advanced monitoring technologies and extensive interpretation of multiple databases. Critical Care Time devoted to patient care services described in this note is 35 minutes.   ALVA,RAKESH V.

## 2013-05-21 NOTE — Progress Notes (Addendum)
Subjective: Continues to be comatose despite pentobarbital being stopped since 7/13.   Exam: Filed Vitals:   05/21/13 1900  BP: 156/53  Pulse: 73  Temp:   Resp: 19   Gen: In bed, NAD MS: Eyes open minimally to noxious stimulation. Does not fixate or track.   When asked to wiggle thumb, she appears to a single time, but this is not repeatable or reliable.   CN:No blink to threat, eyes with right field preference, but cross midline to doll's eye maneuver. Pupils reactive bilaterally with right slightly larger than left as previously noted. Corneals are intact.  Motor: minimal flexion to noxious stimulus in the left arm and bilateral legs. With noxious stimulus, does move right arm minimally.  Sensory:as above.  DTR:1+ and symmetric at biceps and patellae  I have reviewed her MRI from 7/20 - slight extension of pontine infarct, otherwise unchanged.   Impression: 57 yo F with multifocal infarcts who developed status epilepticus that was refractory requiring burst suppression with pentobarbital. She has not had any pentobarbital for 2 weeks but remains comatose. Even if her deficits only mirrored her MRI findings, she would be severely disabled by the strokes that she has had, but with no awakening for two weeks, I feel that the prognosis for recovery to any independent state to be low.   Recommendations: 1) Would continue to advise patient that her prognosis is poor.   [name], MD [facility] [phone]  If 7pm- 7am, please page neurology on call at [phone].

## 2013-05-22 ENCOUNTER — Inpatient Hospital Stay (HOSPITAL_COMMUNITY): Payer: Medicaid Other

## 2013-05-22 LAB — CBC
HCT: 24.5 % — ABNORMAL LOW (ref 36.0–46.0)
MCV: 91.1 fL (ref 78.0–100.0)
RBC: 2.69 MIL/uL — ABNORMAL LOW (ref 3.87–5.11)
WBC: 7.6 10*3/uL (ref 4.0–10.5)

## 2013-05-22 LAB — BASIC METABOLIC PANEL
CO2: 27 mEq/L (ref 19–32)
Chloride: 111 mEq/L (ref 96–112)
GFR calc Af Amer: >90 mL/min (ref >90)
Potassium: 3.5 mEq/L (ref 3.5–5.1)
Sodium: 146 mEq/L — ABNORMAL HIGH (ref 135–145)

## 2013-05-22 LAB — GLUCOSE, CAPILLARY
Glucose-Capillary: 166 mg/dL — ABNORMAL HIGH (ref 70–99)
Glucose-Capillary: 186 mg/dL — ABNORMAL HIGH (ref 70–99)
Glucose-Capillary: 210 mg/dL — ABNORMAL HIGH (ref 70–99)

## 2013-05-22 NOTE — Progress Notes (Signed)
PULMONARY  / CRITICAL CARE MEDICINE  Name: Brenda Pope MRN: [id number] DOB: 1956-02-04    ADMISSION DATE:  05/02/2013 CONSULTATION DATE:  7/7/104  REFERRING MD :  Transfer from [facility] PRIMARY SERVICE:  PCCM  CHIEF COMPLAINT:  Status epilepticus  BRIEF PATIENT DESCRIPTION: 57 y/o with past medical history of CVAs/TIAs and diastolic heart failure, severe baseline hypertension with noncompliance, poorly controlled diabetes admitted to [facility] on 7/5 with acute dyspnea and suspected CHF exacerbation.  On the day of admission developed developed neurological findings consistent with acute CVA. 7/6 intubated for airway protection during suspected seizure activity.  Hypotensive post intubation requiring vasopressors. MRI confirmed small acute infarcts of the right parietal lobe. On 7/7 transferred to [facility] for further management.  SIGNIFICANT EVENTS / STUDIES:  7/05 -  Admitted to [facility] with CHF exacerbation; stroke symptoms 7/06 - Suspected seizure activity, intubated for airway protection; hypotensive post intubation requiring vasopressors 7/06 -  Brain MRI:  Multiple small acute infarcts R parietal lobe, possible small infarcts left basal ganglia and central pons 7/06 - TTE:  No source of CVA, EF 55-60% 7/06 - Carotid Doppler:  No evidence of hemodynamically significant stenosis 7/07 - Transferred to [facility], PCCM serivce 7/07 - Neuro consult: "Concern patient is on SE in the context of recent cortical stroke." 7/08 - MRI brain: Confluent and scattered right MCA and right PCA acute infarcts, plus scattered small left hemisphere and occasional posterior fossa acute infarcts. The vast majority of these infarcts are newly seen since 05/01/2013.  This appearance suggests embolic phenomena which severely affected more proximal/medium-sized vessels of the right MCA and PCA territories. Cytotoxic edema without significant mass effect at this time. No associated hemorrhage 7/08 -  EEG: severe diffuse encephalopathic process with asymmetrical involvement. Findings reported from the left hemisphere were nonspecific. Findings record from the right hemisphere were consistent with acute large area of infarction. No frank epileptiform activity was recorded. 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  7/10 - LP performed by Neuro:  7/10 - Burst suppression with propofol. Norepi initiated to prevent propofol induced hypotension. Consideration of primary CNS vasculitis. Serologies ordered>> CRP, ESR markedly elevated. High dose methylpred ordered. Insulin gtt ordered while on high dose steriods 7/10 - propofol ineffective at burst suppression. pentobarbitol initiated 7/11 - CT head: Right frontal lobe, right parietal lobe, right occipital lobe and posterior right temporal lobe acute/subacute infarcts. Surrounding edema with local mass effect. Minimal petechial hemorrhage suspected particularly along the superior margin of the infarct. No gross hemorrhage noted. No midline shift 7/11 - angiogram eval for vasculitis: smooth focal areas of narrowing of prox PCAs and distal pericallosal. Subcortical branches, suspicious of vasculitis. 7/14 - TEE: no evidence of thrombus 7/20 - MRI brain: Overall mildly regressed signal abnormality in the brain associated with multi focal infarcts. The most confluent infarcts in the right posterior MCA and PCA territories do demonstrate persistent restricted diffusion, but less cytotoxic edema. There is trace petechial hemorrhage, but no malignant hemorrhagic transformation or associated mass effect. Slight extension of a central pontine lacunar infarct (pontine perforator artery territory) without mass effect or hemorrhage 7/21 - EEG: severe encephalopathy, nonspecific as to cause. No electrography seizures  noted.  7/22 - Family indicates that they do not wish to pursue trach/G-tube and SNF. However, they are not ready to "give up" yet citing their concern  that all of the meds used to induced coma might not have fully washed out of her system yet. I have noted that she developed severe hypernatremia and correcting this might improve her MS. We will continue our support for a couple more days. She will be DNR in event of cardiac arrest 7/24 - Family plans on one way extubation when daughter arrives back in town 7/25 or 7/26 7/27 - persistent request from family for one more EEG to ensure slowing before removal of ETT / comfort care   LINES / TUBES: L radial A-line 7/10 >> 7/12 R IJ CVL 7/6 >> 7/21 ETT 7/6 >>     CULTURES: MRSA PCR 7/07 >> POS Blood culture >>ng CSF 7/10 >> ng   ANTIBIOTICS: None  SUBJ:  RN reports no acute events Afebrile Following commands!!!   VITAL SIGNS: Temp:  [97.9 F (36.6 C)-100 F (37.8 C)] 97.9 F (36.6 C) (07/27 0700) Pulse Rate:  [62-79] 69 (07/27 1000) Resp:  [13-22] 14 (07/27 1000) BP: (128-178)/(50-103) 170/70 mmHg (07/27 1000) SpO2:  [96 %-100 %] 98 % (07/27 1000) FiO2 (%):  [39.7 %-40.5 %] 40.1 % (07/27 1000)  HEMODYNAMICS:   VENTILATOR SETTINGS: Vent Mode:  [-] CPAP;PSV FiO2 (%):  [39.7 %-40.5 %] 40.1 % Set Rate:  [14 bmp] 14 bmp Vt Set:  [500 mL] 500 mL PEEP:  [5 cmH20] 5 cmH20 Pressure Support:  [10 cmH20] 10 cmH20 Plateau Pressure:  [14 cmH20-16 cmH20] 14 cmH20  INTAKE / OUTPUT: Intake/Output     07/26 0701 - 07/27 0700 07/27 0701 - 07/28 0700   I.V. (mL/kg) 420 (3.4) 40 (0.3)   NG/GT 1545 90   Total Intake(mL/kg) 1965 (15.9) 130 (1.1)   Urine (mL/kg/hr) 1460 (0.5) 200 (0.4)   Stool 1 (0)    Total Output 1461 200   Net +504 -70          PHYSICAL EXAMINATION: General: chronically ill on vent Neuro: follows commands, stuck out tongue & moves rt thumb HEENT: PERRL, EOMI Cardiovascular:  RRR s M Lungs: resps even, non  labored on vent, few scattered rhonchi  Abdomen:  Soft, nontender, bowel sounds diminished Musculoskeletal: warm, 1-2+ BUE edema, 1+ symmetric BLE edema  LABS: BMET  Recent Labs Lab 05/17/13 0545 05/18/13 0738 05/19/13 0540 05/21/13 0545 05/22/13 0941  NA 157* 151* 150* 148* 146*  K 3.4* 3.6 3.3* 3.6 3.5  CL 122* 117* 116* 113* 111  CO2 32 28 29 30 27   GLUCOSE 238* 275* 185* 211* 196*  BUN 55* 43* 43* 41* 40*  CREATININE 0.85 0.75 0.74 0.68 0.59  CALCIUM 8.3* 8.0* 8.2* 8.3* 8.4   CBC  Recent Labs Lab 05/19/13 0540 05/22/13 0941  HGB 8.7* 8.2*  HCT 27.3* 24.5*  WBC 10.9* 7.6  PLT 212 182    CXR: NNF   ASSESSMENT / PLAN:  PULMONARY A:   Acute respiratory failure due to AMS - Poor cognition prohibits extubation P:   Cont PSV as tolerated Cont vent bundle Plan for trach - d/w husband who is agreeable   CARDIOVASCULAR  A:  Propofol/pentobarb induced hypotension - resolved H/O CHF - compensated Chronic hypertension P:  Monitor  RENAL A:   Acute on CRI - nonoliguric Hypernatremia P:   Monitor BMET Correct electrolytes as needed  GASTROINTESTINAL A:  No issues P:   -Cont TFs   HEMATOLOGIC A:  Mild anemia. P:  -Monitor CBC intermittently -On LMWH for DVT prophy   INFECTIOUS A:  No overt  infections P:   -Micro and abx as above   ENDOCRINE  A:  DM2. Hyperglycemia. Diabetic Neuropathy - very severe, to point she had stepped on a tack and walked with it in her foot for 3 days before she realized it was there P:   -Cont lantus, SSI   NEUROLOGIC A:   Acute CVA in multiple territories, unclear etiology Persistent coma Status epilepticus Concern for CNS vasculitis-received pulse dose steroids P:   -neuro signed off -aggressive rehab  7/26:  Family agrees DNR in the event of cardiac arrest.   7/27:  approx 1 hour spent with family regarding natural progression of current brain injury / end of life care, long term vent / trach support  etc.     Canary Brim, NP-C [facility] Pgr: [phone] or [phone]   The patient is critically ill with multiple organ systems failure and requires high complexity decision making for assessment and support, frequent evaluation and titration of therapies, application of advanced monitoring technologies and extensive interpretation of multiple databases. Critical Care Time devoted to patient care services described in this note is 35 minutes.    Demaree Liberto V.

## 2013-05-23 LAB — CBC
HCT: 25 % — ABNORMAL LOW (ref 36.0–46.0)
Hemoglobin: 8.4 g/dL — ABNORMAL LOW (ref 12.0–15.0)
MCV: 91.2 fL (ref 78.0–100.0)
RDW: 12.3 % (ref 11.5–15.5)
WBC: 5.9 10*3/uL (ref 4.0–10.5)

## 2013-05-23 LAB — GLUCOSE, CAPILLARY
Glucose-Capillary: 178 mg/dL — ABNORMAL HIGH (ref 70–99)
Glucose-Capillary: 200 mg/dL — ABNORMAL HIGH (ref 70–99)
Glucose-Capillary: 205 mg/dL — ABNORMAL HIGH (ref 70–99)
Glucose-Capillary: 212 mg/dL — ABNORMAL HIGH (ref 70–99)
Glucose-Capillary: 243 mg/dL — ABNORMAL HIGH (ref 70–99)

## 2013-05-23 LAB — BASIC METABOLIC PANEL
BUN: 41 mg/dL — ABNORMAL HIGH (ref 6–23)
CO2: 28 mEq/L (ref 19–32)
Chloride: 112 mEq/L (ref 96–112)
Creatinine, Ser: 0.58 mg/dL (ref 0.50–1.10)
GFR calc Af Amer: >90 mL/min (ref >90)
Potassium: 4.2 mEq/L (ref 3.5–5.1)

## 2013-05-23 NOTE — Progress Notes (Signed)
PT Cancellation Note  Patient Details Name: Crestina Strike MRN: [id number] DOB: [birth year]   Cancelled Treatment:    Reason Eval/Treat Not Completed: Patient not medically ready (MD: Notes state pt in coma.  Please advise if coma stim desired?)   INGOLD,Merrisa Skorupski 05/23/2013, 9:34 AM  [facility] [phone] (pager)

## 2013-05-23 NOTE — Progress Notes (Signed)
PULMONARY  / CRITICAL CARE MEDICINE  Name: Brenda Pope MRN: [id number] DOB: 04/19/1956    ADMISSION DATE:  05/02/2013 CONSULTATION DATE:  7/7/104  REFERRING MD :  Transfer from [facility] PRIMARY SERVICE:  PCCM  CHIEF COMPLAINT:  Status epilepticus  BRIEF PATIENT DESCRIPTION: 57 y/o with past medical history of CVAs/TIAs and diastolic heart failure, severe baseline hypertension with noncompliance, poorly controlled diabetes admitted to [facility] on 7/5 with acute dyspnea and suspected CHF exacerbation.  On the day of admission developed developed neurological findings consistent with acute CVA. 7/6 intubated for airway protection during suspected seizure activity.  Hypotensive post intubation requiring vasopressors. MRI confirmed small acute infarcts of the right parietal lobe. On 7/7 transferred to [facility] for further management.  SIGNIFICANT EVENTS / STUDIES:  7/05 -  Admitted to [facility] with CHF exacerbation; stroke symptoms 7/06 - Suspected seizure activity, intubated for airway protection; hypotensive post intubation requiring vasopressors 7/06 -  Brain MRI:  Multiple small acute infarcts R parietal lobe, possible small infarcts left basal ganglia and central pons 7/06 - TTE:  No source of CVA, EF 55-60% 7/06 - Carotid Doppler:  No evidence of hemodynamically significant stenosis 7/07 - Transferred to [facility], PCCM serivce 7/07 - Neuro consult: "Concern patient is on SE in the context of recent cortical stroke." 7/08 - MRI brain: Confluent and scattered right MCA and right PCA acute infarcts, plus scattered small left hemisphere and occasional posterior fossa acute infarcts. The vast majority of these infarcts are newly seen since 05/01/2013.  This appearance suggests embolic phenomena which severely affected more proximal/medium-sized vessels of the right MCA and PCA territories. Cytotoxic edema without significant mass effect at this time. No associated hemorrhage 7/08 -  EEG: severe diffuse encephalopathic process with asymmetrical involvement. Findings reported from the left hemisphere were nonspecific. Findings record from the right hemisphere were consistent with acute large area of infarction. No frank epileptiform activity was recorded. 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  7/10 - LP performed by Neuro:  7/10 - Burst suppression with propofol. Norepi initiated to prevent propofol induced hypotension. Consideration of primary CNS vasculitis. Serologies ordered>> CRP, ESR markedly elevated. High dose methylpred ordered. Insulin gtt ordered while on high dose steriods 7/10 - propofol ineffective at burst suppression. pentobarbitol initiated 7/11 - CT head: Right frontal lobe, right parietal lobe, right occipital lobe and posterior right temporal lobe acute/subacute infarcts. Surrounding edema with local mass effect. Minimal petechial hemorrhage suspected particularly along the superior margin of the infarct. No gross hemorrhage noted. No midline shift 7/11 - angiogram eval for vasculitis: smooth focal areas of narrowing of prox PCAs and distal pericallosal. Subcortical branches, suspicious of vasculitis. 7/14 - TEE: no evidence of thrombus 7/20 - MRI brain: Overall mildly regressed signal abnormality in the brain associated with multi focal infarcts. The most confluent infarcts in the right posterior MCA and PCA territories do demonstrate persistent restricted diffusion, but less cytotoxic edema. There is trace petechial hemorrhage, but no malignant hemorrhagic transformation or associated mass effect. Slight extension of a central pontine lacunar infarct (pontine perforator artery territory) without mass effect or hemorrhage 7/21 - EEG: severe encephalopathy, nonspecific as to cause. No electrography seizures  noted.  7/22 - Family indicates that they do not wish to pursue trach/G-tube and SNF. However, they are not ready to "give up" yet citing their concern  that all of the meds used to induced coma might not have fully washed out of her system yet. I have noted that she developed severe hypernatremia and correcting this might improve her MS. We will continue our support for a couple more days. She will be DNR in event of cardiac arrest 7/24 - Family plans on one way extubation when daughter arrives back in town 7/25 or 7/26 7/27 - persistent request from family for one more EEG to ensure slowing before removal of ETT / comfort care 7/28 - In light of pt following commands this AM, family is now considering Trach and rehab for patient. Husband to decide in the coming days.     LINES / TUBES: L radial A-line 7/10 >> 7/12 R IJ CVL 7/6 >> 7/21 ETT 7/6 >>     CULTURES: MRSA PCR 7/07 >> POS Blood culture >>ng CSF 7/10 >> ng   ANTIBIOTICS: None  SUBJ:  RN reports no acute events Afebrile Following commands intermittently per nurse. She is following multiple commands this AM.    VITAL SIGNS: Temp:  [98 F (36.7 C)-99.4 F (37.4 C)] 98.9 F (37.2 C) (07/28 0749) Pulse Rate:  [59-72] 66 (07/28 1000) Resp:  [13-19] 13 (07/28 1000) BP: (135-180)/(49-68) 165/65 mmHg (07/28 1000) SpO2:  [8 %-100 %] 99 % (07/28 1000) FiO2 (%):  [39.7 %-40.4 %] 39.9 % (07/28 1000)  HEMODYNAMICS:   VENTILATOR SETTINGS: Vent Mode:  [-] CPAP;PSV FiO2 (%):  [39.7 %-40.4 %] 39.9 % Set Rate:  [14 bmp] 14 bmp Vt Set:  [50 mL-500 mL] 500 mL PEEP:  [5 cmH20] 5 cmH20 Pressure Support:  [10 cmH20] 10 cmH20 Plateau Pressure:  [12 cmH20-14 cmH20] 14 cmH20  INTAKE / OUTPUT: Intake/Output     07/27 0701 - 07/28 0700 07/28 0701 - 07/29 0700   I.V. (mL/kg) 400 (3.2) 40 (0.3)   NG/GT 960 90   Total Intake(mL/kg) 1360 (11) 130 (1.1)   Urine (mL/kg/hr) 1250 (0.4) 80 (0.2)   Stool     Total Output 1250 80   Net +110  +50          PHYSICAL EXAMINATION: General: chronically ill on vent Neuro: opens eyes to voice, following commands intermittently  HEENT: PERRL Cardiovascular:  RRR s M Lungs: resps even, non labored on vent, few scattered rhonchi  Abdomen:  Soft, nontender, bowel sounds diminished Musculoskeletal: warm, 1-2+ BUE edema, 1+ symmetric BLE edema  LABS: BMET  Recent Labs Lab 05/18/13 0738 05/19/13 0540 05/21/13 0545 05/22/13 0941 05/23/13 0535  NA 151* 150* 148* 146* 147*  K 3.6 3.3* 3.6 3.5 4.2  CL 117* 116* 113* 111 112  CO2 28 29 30 27 28   GLUCOSE 275* 185* 211* 196* 233*  BUN 43* 43* 41* 40* 41*  CREATININE 0.75 0.74 0.68 0.59 0.58  CALCIUM 8.0* 8.2* 8.3* 8.4 8.6   CBC  Recent Labs Lab 05/19/13 0540 05/22/13 0941 05/23/13 0535  HGB 8.7* 8.2* 8.4*  HCT 27.3* 24.5* 25.0*  WBC 10.9* 7.6 5.9  PLT 212 182 179    CXR: Last CXR 7/27 with no new findings   ASSESSMENT / PLAN:  PULMONARY A:   Acute respiratory failure due to AMS - Poor cognition prohibits extubation P:   Cont PSV as tolerated Cont vent bundle Husband is considering trach and rehab for patient. Family to decide in the coming days. Husband also advised that pt will go to rehab after trach and that she will likely have pt/ot for months  and need to have a PEG tube in place. Also advised that pt will likely need a nursing facility for trach care and will probably not go home in the foreseeable future.   CARDIOVASCULAR  A:  Propofol/pentobarb induced hypotension - resolved H/O CHF - compensated Chronic hypertension P:  Monitor  RENAL A:   Acute on CRI - nonoliguric Hypernatremia P:   Monitor BMET Correct electrolytes as needed  GASTROINTESTINAL A:  No issues P:   -Cont TFs   HEMATOLOGIC A:  Mild anemia. P:  -Monitor CBC intermittently  -On LMWH for DVT prophy   INFECTIOUS A:  No overt infections P:   - Micro and abx as above - Trend fever curve and CBC    ENDOCRINE  A:   DM2. Hyperglycemia. 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   7/27:  approx 1 hour spent with family regarding natural progression of current brain injury / end of life care, long term vent / trach support etc.    Jennifer Little PA-S  CC time 60 min.  Patient seen and examined, agree with above note.  I dictated the care and orders written for this patient under my direction.  Alyson Reedy, MD [phone]

## 2013-05-23 NOTE — Progress Notes (Signed)
NUTRITION FOLLOW UP  Intervention:    Continue Vital AF 1.2 at 45 ml/h with prostat 30 ml 5 times daily to provide a total of 1796 kcals (25 kcals/kg ideal weight), 156 gm protein, 876 ml free water daily.  Nutrition Dx:   Inadequate oral intake related to inability to eat as evidenced by NPO status; ongoing.  Goal:   Enteral nutrition to provide 60-70% of estimated calorie needs (22-25 kcals/kg ideal body weight) and 100% of estimated protein needs, based on ASPEN guidelines for permissive underfeeding in critically ill obese individuals. Met.  Monitor:   TF tolerance/adequacy, labs, weight trend, vent status, overall goals of care.  Assessment:   Per review of physician notes, noted plans for possible tracheostomy. Family still deciding on goals of care. Tolerating TF well at goal rate to meet nutrition goal.  Patient remains intubated on ventilator support.  MV: 8 Temp:Temp (24hrs), Avg:98.7 F (37.1 C), Min:98 F (36.7 C), Max:99.4 F (37.4 C)   Height: Ht Readings from Last 1 Encounters:  05/02/13 6' (1.829 m)    Weight Status:  Trending up with positive fluid status. Wt Readings from Last 1 Encounters:  05/21/13 272 lb 0.8 oz (123.4 kg)  05/07/13  254 lb 3.1 oz (115.3 kg)  05/06/13  257 lb 0.9 oz (116.6 kg)  05/02/13  238 lb 1.6 oz (108 kg)   Re-estimated needs:  Kcal: 2050 Protein: 145 gm protein Fluid: 2-2.2 L   Skin: no issues  Diet Order:  NPO  TF Order: Vital AF 1.2 at 45 ml/h with prostat 30 ml 5 times daily to provide a total of 1796 kcals (25 kcals/kg ideal weight), 156 gm protein, 876 ml free water daily.  Free water flushes: 200 ml every 6 hours  Intake/Output Summary (Last 24 hours) at 05/23/13 1101 Last data filed at 05/23/13 0900  Gross per 24 hour  Intake   1230 ml  Output    930 ml  Net    300 ml    Last BM: 7/28 (diarrhea)   Labs:   Recent Labs Lab 05/21/13 0545 05/22/13 0941 05/23/13 0535  NA 148* 146* 147*  K 3.6 3.5 4.2   CL 113* 111 112  CO2 30 27 28   BUN 41* 40* 41*  CREATININE 0.68 0.59 0.58  CALCIUM 8.3* 8.4 8.6  GLUCOSE 211* 196* 233*    CBG (last 3)   Recent Labs  05/23/13 0022 05/23/13 0426 05/23/13 0747  GLUCAP 243* 212* 200*    Scheduled Meds: . antiseptic oral rinse  1 application Mouth Rinse QID  . aspirin  325 mg Per Tube Daily  . chlorhexidine  15 mL Mouth/Throat BID  . enoxaparin (LOVENOX) injection  40 mg Subcutaneous Q24H  . feeding supplement  30 mL Per Tube 5 X Daily  . free water  200 mL Per Tube Q6H  . insulin aspart  0-20 Units Subcutaneous Q4H  . insulin glargine  40 Units Subcutaneous QHS  . lacosamide  200 mg Per Tube BID  . levETIRAcetam  1,000 mg Per Tube BID  . pantoprazole sodium  40 mg Per Tube Daily  . phenytoin  100 mg Per Tube TID    Continuous Infusions: . sodium chloride Stopped (05/17/13 2000)  . dextrose 5 % with KCl 20 mEq / L 20 mEq (05/23/13 0900)  . feeding supplement (VITAL AF 1.2 CAL) 1,000 mL (05/23/13 0900)    Joaquin Courts, RD, LDN, CNSC Pager [phone] After Hours Pager [phone]

## 2013-05-23 NOTE — Progress Notes (Signed)
Long discussion with pt's husband and sister and brother about their concerns related to the patient's care over the past few days.  Husband feels that he deserved an explanation for neuro service signing off the patient's case.  Husband and siblings all agreed that the patient is at a different place neurologically than she was at this time last week.  They all agreed, and reiterated when I asked again, that they want to proceed with a tracheostomy.  They asked then what the patient's options would be after the trach was placed.  I reviewed all of the options listed below, and this is at least the second time that they have been discussed. At the end of the discussion, the husband asked that I contact Dr. Roseanne Reno (neuro) and let him know that he wanted to speak with him; that I contact the CCM MD to let him know that he was concerned that each MD was telling him a different story; that I notify the unit director that while he was extremely satisfied with the nursing care in the unit, he was not pleased with the medical care.  All of the requests the husband made of me have been completed--I have spoken to Dr. Roseanne Reno, Dr. Molli Knock and with Lawrence Santiago.  Options given to husband are as follows: 1) [facility] (or any other LTACH) is NOT an option for this patient because she is uninsured.  Even if she eventually has Medicaid approved, she can't go to [facility] because they don't accept Medicaid coverage.   2) If pt unable to wean from the vent OR unable to get to at least 28% trach collar, vent SNF in [place] will be the closest facility choice for her.  3) If pt able to wean to 28% trach collar AND has a PEG or is able to take a diet, she can go to a local SNF.  There are few that accept Medicaid (which is only in the application stage) AND a trach, and that even if there is only one choice, that will be the facility to where she will have to transfer.  4) Home is a choice although the husband is on disability  for his back and would have to have significant family support to be able to provide 24hr care to the patient. I did explain to him that any HH arranged before her Medicaid approval would be paid privately by him.  5) Husband also asked about transferring patient to [facility] or [facility].  I expressed this to the CCM MD who said that he would support transport if the family can provide the name and number of an accepting MD. I will make the husband aware of this option immediately.

## 2013-05-24 ENCOUNTER — Inpatient Hospital Stay (HOSPITAL_COMMUNITY): Payer: Medicaid Other

## 2013-05-24 LAB — BLOOD GAS, ARTERIAL
Acid-Base Excess: 5.1 mmol/L — ABNORMAL HIGH (ref 0.0–2.0)
O2 Saturation: 99.9 %
Patient temperature: 98.6
TCO2: 30.8 mmol/L (ref 0–100)
pCO2 arterial: 45.7 mmHg — ABNORMAL HIGH (ref 35.0–45.0)

## 2013-05-24 LAB — GLUCOSE, CAPILLARY
Glucose-Capillary: 193 mg/dL — ABNORMAL HIGH (ref 70–99)
Glucose-Capillary: 210 mg/dL — ABNORMAL HIGH (ref 70–99)
Glucose-Capillary: 220 mg/dL — ABNORMAL HIGH (ref 70–99)

## 2013-05-24 LAB — BASIC METABOLIC PANEL
Calcium: 8.6 mg/dL (ref 8.4–10.5)
Creatinine, Ser: 0.62 mg/dL (ref 0.50–1.10)
GFR calc Af Amer: >90 mL/min (ref >90)
GFR calc non Af Amer: >90 mL/min (ref >90)
Sodium: 147 mEq/L — ABNORMAL HIGH (ref 135–145)

## 2013-05-24 LAB — MAGNESIUM: Magnesium: 2.3 mg/dL (ref 1.5–2.5)

## 2013-05-24 LAB — CBC
MCH: 29.8 pg (ref 26.0–34.0)
MCV: 91.6 fL (ref 78.0–100.0)
Platelets: 199 10*3/uL (ref 150–400)
RDW: 12.5 % (ref 11.5–15.5)

## 2013-05-24 MED ORDER — ETOMIDATE 2 MG/ML IV SOLN
40.0000 mg | Freq: Once | INTRAVENOUS | Status: AC
  Administered 2013-05-25: 40 mg via INTRAVENOUS
  Filled 2013-05-24: qty 20

## 2013-05-24 MED ORDER — PROPOFOL 10 MG/ML IV EMUL
5.0000 ug/kg/min | Freq: Once | INTRAVENOUS | Status: AC
  Administered 2013-05-25: 33.095 ug/kg/min via INTRAVENOUS

## 2013-05-24 MED ORDER — MIDAZOLAM HCL 2 MG/2ML IJ SOLN
4.0000 mg | Freq: Once | INTRAMUSCULAR | Status: AC
  Administered 2013-05-25: 4 mg via INTRAVENOUS
  Filled 2013-05-24 (×2): qty 4

## 2013-05-24 MED ORDER — VECURONIUM BROMIDE 10 MG IV SOLR
10.0000 mg | Freq: Once | INTRAVENOUS | Status: DC
  Filled 2013-05-24: qty 10

## 2013-05-24 MED ORDER — FENTANYL CITRATE 0.05 MG/ML IJ SOLN
200.0000 ug | Freq: Once | INTRAMUSCULAR | Status: AC
  Administered 2013-05-25: 200 ug via INTRAVENOUS
  Filled 2013-05-24 (×2): qty 4

## 2013-05-24 NOTE — Progress Notes (Signed)
Spoke with nursing about this pt.  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  Pt not following consistent commands.  Once in awhile she will but nursing feels this pt is not ready for therapy at this time.  Per the chart, this therapist agrees.  Please reconsult when medically ready for therapy. Tory Emerald, [place] [phone]

## 2013-05-24 NOTE — Progress Notes (Signed)
PULMONARY  / CRITICAL CARE MEDICINE  Name: Manali Mcelmurry MRN: [id number] DOB: 1956/06/01    ADMISSION DATE:  05/02/2013 CONSULTATION DATE:  7/7/104  REFERRING MD :  Transfer from [facility] PRIMARY SERVICE:  PCCM  CHIEF COMPLAINT:  Status epilepticus  BRIEF PATIENT DESCRIPTION: 57 y/o with past medical history of CVAs/TIAs and diastolic heart failure, severe baseline hypertension with noncompliance, poorly controlled diabetes admitted to [facility] on 7/5 with acute dyspnea and suspected CHF exacerbation.  On the day of admission developed developed neurological findings consistent with acute CVA. 7/6 intubated for airway protection during suspected seizure activity.  Hypotensive post intubation requiring vasopressors. MRI confirmed small acute infarcts of the right parietal lobe. On 7/7 transferred to [facility] for further management.  SIGNIFICANT EVENTS / STUDIES:  7/05 -  Admitted to  regional with CHF exacerbation; stroke symptoms 7/06 - Suspected seizure activity, intubated for airway protection; hypotensive post intubation requiring vasopressors 7/06 -  Brain MRI:  Multiple small acute infarcts R parietal lobe, possible small infarcts left basal ganglia and central pons 7/06 - TTE:  No source of CVA, EF 55-60% 7/06 - Carotid Doppler:  No evidence of hemodynamically significant stenosis 7/07 - Transferred to [facility], PCCM serivce 7/07 - Neuro consult: "Concern patient is on SE in the context of recent cortical stroke." 7/08 - MRI brain: Confluent and scattered right MCA and right PCA acute infarcts, plus scattered small left hemisphere and occasional posterior fossa acute infarcts. The vast majority of these infarcts are newly seen since 05/01/2013.  This appearance suggests embolic phenomena which severely affected more proximal/medium-sized vessels of the right MCA and PCA territories. Cytotoxic edema without significant mass effect at this time. No associated hemorrhage 7/08 -  EEG: severe diffuse encephalopathic process with asymmetrical involvement. Findings reported from the left hemisphere were nonspecific. Findings record from the right hemisphere were consistent with acute large area of infarction. No frank epileptiform activity was recorded. 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  7/10 - LP performed by Neuro:  7/10 - Burst suppression with propofol. Norepi initiated to prevent propofol induced hypotension. Consideration of primary CNS vasculitis. Serologies ordered>> CRP, ESR markedly elevated. High dose methylpred ordered. Insulin gtt ordered while on high dose steriods 7/10 - propofol ineffective at burst suppression. pentobarbitol initiated 7/11 - CT head: Right frontal lobe, right parietal lobe, right occipital lobe and posterior right temporal lobe acute/subacute infarcts. Surrounding edema with local mass effect. Minimal petechial hemorrhage suspected particularly along the superior margin of the infarct. No gross hemorrhage noted. No midline shift 7/11 - angiogram eval for vasculitis: smooth focal areas of narrowing of prox PCAs and distal pericallosal. Subcortical branches, suspicious of vasculitis. 7/14 - TEE: no evidence of thrombus 7/20 - MRI brain: Overall mildly regressed signal abnormality in the brain associated with multi focal infarcts. The most confluent infarcts in the right posterior MCA and PCA territories do demonstrate persistent restricted diffusion, but less cytotoxic edema. There is trace petechial hemorrhage, but no malignant hemorrhagic transformation or associated mass effect. Slight extension of a central pontine lacunar infarct (pontine perforator artery territory) without mass effect or hemorrhage 7/21 - EEG: severe encephalopathy, nonspecific as to cause. No electrography seizures  noted.  7/22 - Family indicates that they do not wish to pursue trach/G-tube and SNF. However, they are not ready to "give up" yet citing their concern  that all of the meds used to induced coma might not have fully washed out of her system yet. I have noted that she developed severe hypernatremia and correcting this might improve her MS. We will continue our support for a couple more days. She will be DNR in event of cardiac arrest 7/24 - Family plans on one way extubation when daughter arrives back in town 7/25 or 7/26 7/27 - persistent request from family for one more EEG to ensure slowing before removal of ETT / comfort care 7/28 - In light of pt following commands this AM, family is now considering Trach and rehab for patient. Husband to decide in the coming days.  7/28- Case manager explained that LTAC is not an option for this patient because she is uninsured.  7/29- Family has decided to do trach. Trach scheduled for 10am 7/29. Husband remains unsure about PEG tube.     LINES / TUBES: L radial A-line 7/10 >> 7/12 R IJ CVL 7/6 >> 7/21 ETT 7/6 >>     CULTURES: MRSA PCR 7/07 >> POS Blood culture >>ng CSF 7/10 >> ng   ANTIBIOTICS: None  SUBJ:  No acute events, patient afebrile, she is not following commands this AM.    VITAL SIGNS: Temp:  [98.7 F (37.1 C)-99.8 F (37.7 C)] 99.8 F (37.7 C) (07/29 0825) Pulse Rate:  [62-77] 72 (07/29 1000) Resp:  [12-19] 18 (07/29 1000) BP: (139-171)/(47-67) 161/61 mmHg (07/29 1000) SpO2:  [97 %-100 %] 97 % (07/29 1000) FiO2 (%):  [39.8 %-98.8 %] 40.1 % (07/29 1000) Weight:  [125.9 kg (277 lb 9 oz)] 125.9 kg (277 lb 9 oz) (07/29 0500)  HEMODYNAMICS:   VENTILATOR SETTINGS: Vent Mode:  [-] PSV;CPAP FiO2 (%):  [39.8 %-98.8 %] 40.1 % Set Rate:  [14 bmp] 14 bmp Vt Set:  [500 mL] 500 mL PEEP:  [5 cmH20] 5 cmH20 Pressure Support:  [10 cmH20] 10 cmH20 Plateau Pressure:  [12 cmH20-14 cmH20] 14 cmH20  INTAKE / OUTPUT: Intake/Output      07/28 0701 - 07/29 0700 07/29 0701 - 07/30 0700   I.V. (mL/kg) 480 (3.8) 60 (0.5)   NG/GT 1850 335   Total Intake(mL/kg) 2330 (18.5) 395 (3.1)   Urine (mL/kg/hr) 1540 (0.5) 320 (0.7)   Total Output 1540 320   Net +790 +75        Stool Occurrence 1 x 1 x     PHYSICAL EXAMINATION: General: chronically ill on vent Neuro: opens eyes to voice, following commands intermittently. Less this AM HEENT: PERRL Cardiovascular:  RRR s M Lungs: resps even, non labored on vent, few scattered rhonchi  Abdomen:  Soft, nontender, bowel sounds diminished Musculoskeletal: warm, 1-2+ BUE edema, 1+ symmetric BLE edema  LABS: BMET  Recent Labs Lab 05/19/13 0540 05/21/13 0545 05/22/13 0941 05/23/13 0535 05/24/13 0335  NA 150* 148* 146* 147* 147*  K 3.3* 3.6 3.5 4.2 3.6  CL 116* 113* 111 112 112  CO2 29 30 27 28 30   GLUCOSE 185* 211* 196* 233* 228*  BUN 43* 41* 40* 41* 43*  CREATININE 0.74 0.68 0.59 0.58 0.62  CALCIUM 8.2* 8.3* 8.4 8.6 8.6  MG  --   --   --   --  2.3  PHOS  --   --   --   --  3.1   CBC  Recent Labs Lab 05/22/13 0941 05/23/13 0535 05/24/13 0335  HGB 8.2* 8.4* 8.2*  HCT 24.5* 25.0* 25.2*  WBC 7.6 5.9 5.2  PLT 182 179 199    CXR: Stable, no new findings. Bilateral pleural effusions and bibasilar opacities.    ASSESSMENT / PLAN:  PULMONARY A:   Acute respiratory failure due to AMS - Poor cognition prohibits extubation P:   - Cont PSV as tolerated - Cont vent bundle - Husband is considering trach and rehab for patient. Family to decide in the coming days. Husband also advised that pt will go to rehab after trach and that she will likely have pt/ot for months and need to have a PEG tube in place. Also advised that pt will likely need a nursing facility for trach care and will probably not go home in the foreseeable future.  - Another family meeting on 7/29. Family has decided to have trach done, but still undecided about PEG tube. Trach to be done 7/30 at 10AM.    CARDIOVASCULAR  A:  Propofol/pentobarb induced hypotension - resolved H/O CHF - compensated Chronic hypertension P:  Monitor  RENAL A:   Acute on CRI - nonoliguric Hypernatremia P:   Monitor BMET Correct electrolytes and free water deficit as needed   GASTROINTESTINAL A:  No issues P:   -Cont TFs   HEMATOLOGIC A:  Mild anemia. P:  -Monitor CBC intermittently  -On LMWH for DVT prophy   INFECTIOUS A:  No overt infections P:   - Micro and abx as above - Trend fever curve and CBC    ENDOCRINE  A:  DM2. Hyperglycemia. 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   7/27:  approx 1 hour spent with family regarding natural progression of current brain injury / end of life care, long term vent / trach support etc.    7/29: Meeting with Case Manager and family to discuss future plans regarding Janina Mayo, after an extensive discussion, husband finally made a decision that he would like for the patient to be trached but remains unsure about a feeding tube.  Despite my best efforts to convince him of need for PEG he remains unsure about it.  Will trach on 7/30 at 10 AM.   CC time 90 min.  Alyson Reedy, M.D. [facility] Pulmonary/Critical Care Medicine. Pager: [phone]. After hours pager: [phone].

## 2013-05-24 NOTE — Progress Notes (Signed)
PT Cancellation Note  Patient Details Name: Brenda Pope MRN: [id number] DOB: 02/05/1956   Cancelled Treatment:    Reason Eval/Treat Not Completed: Medical issues which prohibited therapy;Patient not medically ready.  Per nursing, pt not consistently following command and not ready to participate with therapy.  She is scheduled for trach 7/30.   Please reorder PT at that time if pt becomes more able to participate.  Will sign off at this time and ask for reorder when pt more able to participate. 05/24/2013  [place] Bing, PT [phone]  (pager) .  Illya Gienger, Eliseo Gum 05/24/2013, 1:50 PM

## 2013-05-25 ENCOUNTER — Inpatient Hospital Stay (HOSPITAL_COMMUNITY): Payer: Medicaid Other

## 2013-05-25 ENCOUNTER — Encounter (HOSPITAL_COMMUNITY): Payer: Self-pay | Admitting: *Deleted

## 2013-05-25 LAB — GLUCOSE, CAPILLARY
Glucose-Capillary: 148 mg/dL — ABNORMAL HIGH (ref 70–99)
Glucose-Capillary: 240 mg/dL — ABNORMAL HIGH (ref 70–99)

## 2013-05-25 LAB — BLOOD GAS, ARTERIAL
Drawn by: 39899
MECHVT: 500 mL
PEEP: 5 cmH2O
Patient temperature: 98.6
RATE: 14 resp/min
pCO2 arterial: 41.6 mmHg (ref 35.0–45.0)
pH, Arterial: 7.469 — ABNORMAL HIGH (ref 7.350–7.450)

## 2013-05-25 LAB — BASIC METABOLIC PANEL
BUN: 45 mg/dL — ABNORMAL HIGH (ref 6–23)
Calcium: 8.7 mg/dL (ref 8.4–10.5)
Creatinine, Ser: 0.59 mg/dL (ref 0.50–1.10)
GFR calc non Af Amer: >90 mL/min (ref >90)
Glucose, Bld: 184 mg/dL — ABNORMAL HIGH (ref 70–99)
Sodium: 146 mEq/L — ABNORMAL HIGH (ref 135–145)

## 2013-05-25 LAB — CBC
HCT: 24.2 % — ABNORMAL LOW (ref 36.0–46.0)
RBC: 2.64 MIL/uL — ABNORMAL LOW (ref 3.87–5.11)
WBC: 4.3 10*3/uL (ref 4.0–10.5)

## 2013-05-25 LAB — PHOSPHORUS: Phosphorus: 3.2 mg/dL (ref 2.3–4.6)

## 2013-05-25 LAB — PROTIME-INR: Prothrombin Time: 14.3 seconds (ref 11.6–15.2)

## 2013-05-25 NOTE — Progress Notes (Signed)
PULMONARY  / CRITICAL CARE MEDICINE  Name: Ryonna Cimini MRN: [id number] DOB: 15-Jul-1956    ADMISSION DATE:  05/02/2013 CONSULTATION DATE:  7/7/104  REFERRING MD :  Transfer from [facility] PRIMARY SERVICE:  PCCM  CHIEF COMPLAINT:  Status epilepticus  BRIEF PATIENT DESCRIPTION: 57 y/o with past medical history of CVAs/TIAs and diastolic heart failure, severe baseline hypertension with noncompliance, poorly controlled diabetes admitted to [facility] on 7/5 with acute dyspnea and suspected CHF exacerbation.  On the day of admission developed developed neurological findings consistent with acute CVA. 7/6 intubated for airway protection during suspected seizure activity.  Hypotensive post intubation requiring vasopressors. MRI confirmed small acute infarcts of the right parietal lobe. On 7/7 transferred to [facility] for further management.  SIGNIFICANT EVENTS / STUDIES:  7/05 -  Admitted to  regional with CHF exacerbation; stroke symptoms 7/06 - Suspected seizure activity, intubated for airway protection; hypotensive post intubation requiring vasopressors 7/06 -  Brain MRI:  Multiple small acute infarcts R parietal lobe, possible small infarcts left basal ganglia and central pons 7/06 - TTE:  No source of CVA, EF 55-60% 7/06 - Carotid Doppler:  No evidence of hemodynamically significant stenosis 7/07 - Transferred to [facility], PCCM serivce 7/07 - Neuro consult: "Concern patient is on SE in the context of recent cortical stroke." 7/08 - MRI brain: Confluent and scattered right MCA and right PCA acute infarcts, plus scattered small left hemisphere and occasional posterior fossa acute infarcts. The vast majority of these infarcts are newly seen since 05/01/2013.  This appearance suggests embolic phenomena which severely affected more proximal/medium-sized vessels of the right MCA and PCA territories. Cytotoxic edema without significant mass effect at this time. No associated hemorrhage 7/08 -  EEG: severe diffuse encephalopathic process with asymmetrical involvement. Findings reported from the left hemisphere were nonspecific. Findings record from the right hemisphere were consistent with acute large area of infarction. No frank epileptiform activity was recorded. 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  7/10 - LP performed by Neuro:  7/10 - Burst suppression with propofol. Norepi initiated to prevent propofol induced hypotension. Consideration of primary CNS vasculitis. Serologies ordered>> CRP, ESR markedly elevated. High dose methylpred ordered. Insulin gtt ordered while on high dose steriods 7/10 - propofol ineffective at burst suppression. pentobarbitol initiated 7/11 - CT head: Right frontal lobe, right parietal lobe, right occipital lobe and posterior right temporal lobe acute/subacute infarcts. Surrounding edema with local mass effect. Minimal petechial hemorrhage suspected particularly along the superior margin of the infarct. No gross hemorrhage noted. No midline shift 7/11 - angiogram eval for vasculitis: smooth focal areas of narrowing of prox PCAs and distal pericallosal. Subcortical branches, suspicious of vasculitis. 7/14 - TEE: no evidence of thrombus 7/20 - MRI brain: Overall mildly regressed signal abnormality in the brain associated with multi focal infarcts. The most confluent infarcts in the right posterior MCA and PCA territories do demonstrate persistent restricted diffusion, but less cytotoxic edema. There is trace petechial hemorrhage, but no malignant hemorrhagic transformation or associated mass effect. Slight extension of a central pontine lacunar infarct (pontine perforator artery territory) without mass effect or hemorrhage 7/21 - EEG: severe encephalopathy, nonspecific as to cause. No electrography seizures  noted.  7/22 - Family indicates that they do not wish to pursue trach/G-tube and SNF. However, they are not ready to "give up" yet citing their concern  that all of the meds used to induced coma might not have fully washed out of her system yet. I have noted that she developed severe hypernatremia and correcting this might improve her MS. We will continue our support for a couple more days. She will be DNR in event of cardiac arrest 7/24 - Family plans on one way extubation when daughter arrives back in town 7/25 or 7/26 7/27 - persistent request from family for one more EEG to ensure slowing before removal of ETT / comfort care 7/28 - In light of pt following commands this AM, family is now considering Trach and rehab for patient. Husband to decide in the coming days.  7/28- Case manager explained that LTAC is not an option for this patient because she is uninsured.  7/29- Family has decided to do trach. Trach scheduled for 10am 7/29. Husband remains unsure about PEG tube.  7/30- Trach completed by Dr. Molli Knock    LINES / TUBES: L radial A-line 7/10 >> 7/12 R IJ CVL 7/6 >> 7/21 ETT 7/6 >> 7/30 Trach 7/30 >>>     CULTURES: MRSA PCR 7/07 >> POS Blood culture >>ng CSF 7/10 >> ng   ANTIBIOTICS: None  SUBJ:  No acute events, patient afebrile. Family reports patient is following commands this AM, but nurse has only seen patient withdrawal to pain.     VITAL SIGNS: Temp:  [98.6 F (37 C)-100 F (37.8 C)] 98.6 F (37 C) (07/30 0800) Pulse Rate:  [60-79] 74 (07/30 0900) Resp:  [13-28] 14 (07/30 0900) BP: (123-179)/(43-63) 161/57 mmHg (07/30 0900) SpO2:  [95 %-98 %] 98 % (07/30 0900) FiO2 (%):  [30 %-40.5 %] 40 % (07/30 0900)  HEMODYNAMICS:   VENTILATOR SETTINGS: Vent Mode:  [-] CPAP;PSV FiO2 (%):  [30 %-40.5 %] 40 % Set Rate:  [14 bmp] 14 bmp Vt Set:  [500 mL] 500 mL PEEP:  [5 cmH20] 5 cmH20 Pressure Support:  [8 cmH20] 8 cmH20 Plateau Pressure:  [13 cmH20-15 cmH20] 13  cmH20  INTAKE / OUTPUT: Intake/Output     07/29 0701 - 07/30 0700 07/30 0701 - 07/31 0700   I.V. (mL/kg) 440 (3.5) 40 (0.3)   NG/GT 1920 0   Total Intake(mL/kg) 2360 (18.7) 40 (0.3)   Urine (mL/kg/hr) 1535 (0.5) 200 (0.6)   Total Output 1535 200   Net +825 -160        Stool Occurrence 1 x      PHYSICAL EXAMINATION: General: chronically ill on vent, not following commands this AM Neuro: opens eyes to voice, following commands intermittently. Less this AM HEENT: PERRL Cardiovascular:  RRR s M Lungs: resps even, non labored on vent, few scattered rhonchi  Abdomen:  Soft, nontender, bowel sounds diminished Musculoskeletal: warm, pale, trace edema bilateral lower extremities  LABS: BMET  Recent Labs Lab 05/21/13 0545 05/22/13 0941 05/23/13 0535 05/24/13 0335 05/25/13 0540  NA 148* 146* 147* 147* 146*  K 3.6 3.5 4.2 3.6 3.6  CL 113* 111 112 112 110  CO2 30 27 28 30 30   GLUCOSE 211* 196* 233* 228* 184*  BUN 41* 40* 41* 43* 45*  CREATININE 0.68 0.59 0.58 0.62 0.59  CALCIUM 8.3* 8.4 8.6 8.6 8.7  MG  --   --   --  2.3 2.3  PHOS  --   --   --  3.1 3.2   CBC  Recent Labs Lab 05/23/13 0535 05/24/13 0335 05/25/13 0540  HGB 8.4* 8.2* 7.9*  HCT 25.0* 25.2* 24.2*  WBC  5.9 5.2 4.3  PLT 179 199 197    CXR: Stable, no new findings. Persistent left lower lobe atelectasis and trace left pleural effusion.    ASSESSMENT / PLAN:  PULMONARY A:   Acute respiratory failure due to AMS - Poor cognition prohibits extubation P:   - Cont PSV as tolerated - Cont vent bundle - Trach done today at 10AM by Dr. Molli Knock  CARDIOVASCULAR  A:  Propofol/pentobarb induced hypotension - resolved H/O CHF - compensated Chronic hypertension P:  Monitor  RENAL A:   Acute on CRI - nonoliguric Hypernatremia P:   Monitor BMET Correct electrolytes and free water deficit as needed   GASTROINTESTINAL A:  No issues P:   -Cont TFs   HEMATOLOGIC A:  Mild anemia. P:  -Monitor CBC  intermittently  -On LMWH for DVT prophy   INFECTIOUS A:  No overt infections P:   - Micro and abx as above - Trend fever curve and CBC    ENDOCRINE  A:  DM2. Hyperglycemia. 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   7/27:  approx 1 hour spent with family regarding natural progression of current brain injury / end of life care, long term vent / trach support etc.    7/29: Meeting with Case Manager and family to discuss future plans regarding Janina Mayo, after an extensive discussion, husband finally made a decision that he would like for the patient to be trached but remains unsure about a feeding tube.  Despite my best efforts to convince him of need for PEG he remains unsure about it.  Will trach on 7/30 at 10 AM.   7/30: Trach done by Dr. Sharen Hint Little PA-S  CC time 35 min.  Patient seen and examined, agree with above note.  I dictated the care and orders written for this patient under my direction.  Alyson Reedy, MD [phone]

## 2013-05-25 NOTE — Procedures (Signed)
Percutaneous Tracheostomy Placement  Consent from husband, patient sedated and paralyzed, area cleaned, lidocaine injected, skin incision done followed by blunt dissection, trachea palpated, trachea entered and wire placed and visualized bronchoscopically, trachea dilated and trach placed, visualized above carina pronchoscopically.  CXR ordered and pending.  Alyson Reedy, M.D. [facility] Pulmonary/Critical Care Medicine. Pager: [phone]. After hours pager: [phone].

## 2013-05-25 NOTE — Progress Notes (Signed)
Inpatient Diabetes Program Recommendations  AACE/ADA: New Consensus Statement on Inpatient Glycemic Control (2013)  Target Ranges:  Prepandial:   less than 140 mg/dL      Peak postprandial:   less than 180 mg/dL (1-2 hours)      Critically ill patients:  140 - 180 mg/dL     Results for Brenda Pope, Brenda Pope (MRN [id number]) as of 05/25/2013 13:25  Ref. Range 05/24/2013 00:30 05/24/2013 03:35 05/24/2013 08:22 05/24/2013 12:24 05/24/2013 15:42 05/24/2013 19:48  Glucose-Capillary Latest Range: 70-99 mg/dL 409 (H) 811 (H) 914 (H) 220 (H) 193 (H) 210 (H)    **Noted Tube feeds currently on hold for trach placement  **MD- Once tube feeds resumed, please consider adding Novolog tube feed coverage if patient's CBGs become elevated again once tube feeds resumed Novolog 3 units Q4 hours (hold if tube feeds held for any reason)   Will follow. Ambrose Finland RN, MSN, CDE Diabetes Coordinator Inpatient Diabetes Program [phone]

## 2013-05-25 NOTE — Procedures (Signed)
Bronchoscopy Procedure Note Brenda Pope [id number] 1956-04-15  Procedure: Bronchoscopy Indications: Placement of Tracheostomy  Procedure Details Consent: Risks of procedure as well as the alternatives and risks of each were explained to the (patient/caregiver).  Consent for procedure obtained. Time Out: Verified patient identification, verified procedure, site/side was marked, verified correct patient position, special equipment/implants available, medications/allergies/relevent history reviewed, required imaging and test results available.  Performed  In preparation for procedure, patient was given 100% FiO2 and bronchoscope lubricated. Sedation: Benzodiazepines and Etomidate  Airway entered and the following bronchi were examined: RUL, RML, RLL, LUL and LLL.   Procedures performed: Brushings performed-none Bronchoscope removed.    Evaluation Hemodynamic Status: BP stable throughout; O2 sats: stable throughout Patient's Current Condition: stable Specimens:  None Complications: No apparent complications Patient did tolerate procedure well.   Procedure performed under direct supervision of Dr. Tyson Alias.     perfromed with, tolerated well  Brenda Brim, NP-C [facility] Pgr: [phone] or [phone]  Mcarthur Rossetti. Tyson Alias, MD, FACP Pgr: [phone] [facility]'     05/25/2013

## 2013-05-25 NOTE — Progress Notes (Signed)
Subjective: Patient remains comatose.  Trached.    Objective: Current vital signs: BP 138/52  Pulse 63  Temp(Src) 98.5 F (36.9 C) (Axillary)  Resp 15  Ht 6' (1.829 m)  Wt 125.9 kg (277 lb 9 oz)  BMI 37.64 kg/m2  SpO2 97% Vital signs in last 24 hours: Temp:  [98.5 F (36.9 C)-99.8 F (37.7 C)] 98.5 F (36.9 C) (07/30 1200) Pulse Rate:  [57-79] 63 (07/30 1700) Resp:  [13-28] 15 (07/30 1700) BP: (123-187)/(43-80) 138/52 mmHg (07/30 1700) SpO2:  [95 %-99 %] 97 % (07/30 1700) FiO2 (%):  [30 %-100 %] 40 % (07/30 1700)  Intake/Output from previous day: 07/29 0701 - 07/30 0700 In: 2360 [I.V.:440; NG/GT:1920] Out: 1535 [Urine:1535] Intake/Output this shift: Total I/O In: 500 [I.V.:180; NG/GT:320] Out: 750 [Urine:750] Nutritional status: NPO  Neurologic Exam: Mental Status: Patient does not respond to verbal stimuli.  Opens eyes with deep sternal rub.  Does not follow commands.  No verbalizations noted.  Cranial Nerves: II: patient's eyes blink when confronted visually, pupils right 3 mm, left 3 mm,and reactive bilaterally III,IV,VI: doll's response present bilaterally.  V,VII: corneal reflex present bilaterally  VIII: patient does not respond to verbal stimuli IX,X: gag reflex not tested, XI: trapezius strength unable to test bilaterally XII: tongue strength unable to test Motor: Extremities flaccid throughout.  No spontaneous movement noted.  No purposeful movements noted. Sensory: Does not respond to noxious stimuli in any extremity. Deep Tendon Reflexes:  Absent throughout. Plantars: mute bilaterally Cerebellar: Unable to perform    Lab Results: Basic Metabolic Panel:  Recent Labs Lab 05/21/13 0545 05/22/13 0941 05/23/13 0535 05/24/13 0335 05/25/13 0540  NA 148* 146* 147* 147* 146*  K 3.6 3.5 4.2 3.6 3.6  CL 113* 111 112 112 110  CO2 30 27 28 30 30   GLUCOSE 211* 196* 233* 228* 184*  BUN 41* 40* 41* 43* 45*  CREATININE 0.68 0.59 0.58 0.62 0.59   CALCIUM 8.3* 8.4 8.6 8.6 8.7  MG  --   --   --  2.3 2.3  PHOS  --   --   --  3.1 3.2    Liver Function Tests: No results found for this basename: AST, ALT, ALKPHOS, BILITOT, PROT, ALBUMIN,  in the last 168 hours No results found for this basename: LIPASE, AMYLASE,  in the last 168 hours No results found for this basename: AMMONIA,  in the last 168 hours  CBC:  Recent Labs Lab 05/19/13 0540 05/22/13 0941 05/23/13 0535 05/24/13 0335 05/25/13 0540  WBC 10.9* 7.6 5.9 5.2 4.3  HGB 8.7* 8.2* 8.4* 8.2* 7.9*  HCT 27.3* 24.5* 25.0* 25.2* 24.2*  MCV 93.8 91.1 91.2 91.6 91.7  PLT 212 182 179 199 197    Cardiac Enzymes: No results found for this basename: CKTOTAL, CKMB, CKMBINDEX, TROPONINI,  in the last 168 hours  Lipid Panel: No results found for this basename: CHOL, TRIG, HDL, CHOLHDL, VLDL, LDLCALC,  in the last 168 hours  CBG:  Recent Labs Lab 05/25/13 0002 05/25/13 0430 05/25/13 0827 05/25/13 1127 05/25/13 1608  GLUCAP 240* 162* 148* 121* 125*    Microbiology: Results for orders placed during the hospital encounter of 05/02/13  MRSA PCR SCREENING     Status: Abnormal   Collection Time    05/02/13 10:25 PM      Result Value Range Status   MRSA by PCR POSITIVE (*) NEGATIVE Final   Comment:            The  GeneXpert MRSA Assay (FDA     approved for NASAL specimens     only), is one component of a     comprehensive MRSA colonization     surveillance program. It is not     intended to diagnose MRSA     infection nor to guide or     monitor treatment for     MRSA infections.     RESULT CALLED TO, READ BACK BY AND VERIFIED WITH:     GRAHAM,K RN 0015 05/03/13 MITCHELL,L  CSF CULTURE     Status: None   Collection Time    05/05/13 12:43 PM      Result Value Range Status   Specimen Description CSF   Final   Special Requests 1.0ML FLUID   Final   Gram Stain     Final   Value: CYTOSPIN SLIDE WBC PRESENT,BOTH PMN AND MONONUCLEAR     NO ORGANISMS SEEN   Culture NO  GROWTH 3 DAYS   Final   Report Status 05/08/2013 FINAL   Final  GRAM STAIN     Status: None   Collection Time    05/05/13 12:43 PM      Result Value Range Status   Specimen Description CSF   Final   Special Requests 1.0ML FLUID   Final   Gram Stain     Final   Value: CYTOSPIN SLIDE:     WBC PRESENT,BOTH PMN AND MONONUCLEAR     NO ORGANISMS SEEN   Report Status 05/05/2013 FINAL   Final  CULTURE, BLOOD (ROUTINE X 2)     Status: None   Collection Time    05/05/13  2:50 PM      Result Value Range Status   Specimen Description BLOOD LEFT FINGER   Final   Special Requests BOTTLES DRAWN AEROBIC ONLY [facility]   Final   Culture  Setup Time 05/05/2013 21:09   Final   Culture NO GROWTH 5 DAYS   Final   Report Status 05/11/2013 FINAL   Final  CULTURE, BLOOD (ROUTINE X 2)     Status: None   Collection Time    05/05/13  3:03 PM      Result Value Range Status   Specimen Description BLOOD LEFT ARM   Final   Special Requests BOTTLES DRAWN AEROBIC AND ANAEROBIC 10CC   Final   Culture  Setup Time 05/05/2013 21:09   Final   Culture NO GROWTH 5 DAYS   Final   Report Status 05/11/2013 FINAL   Final    Coagulation Studies:  Recent Labs  05/25/13 0540  LABPROT 14.3  INR 1.13    Imaging: Chest Portable 1 View To Assess Tube Placement And Rule-out Pneumothorax  05/25/2013   *RADIOLOGY REPORT*  Clinical Data: Tracheostomy tube placement  PORTABLE CHEST - 1 VIEW  Comparison: Same date.  Findings: Tracheostomy tube is seen in grossly good position with distal tip approximately 2 cm above the carina.  Dobbhoff tube is seen passing through esophagus into stomach.  No pneumothorax is noted.  No acute pulmonary disease is noted.  Bony thorax is intact.  IMPRESSION: Tracheostomy tube in grossly good position.  No pneumothorax is noted.   Original Report Authenticated By: Lupita Raider.,  M.D.   Dg Chest Port 1 View  05/25/2013   *RADIOLOGY REPORT*  Clinical Data: Evaluate endotracheal tube position.   PORTABLE CHEST - 1 VIEW  Comparison: Chest x-ray 05/24/2013.  Findings: An endotracheal tube is in place with tip  3.8 cm above the carina. A feeding tube is seen extending into the abdomen, however, the tip of the feeding tube extends below the lower margin of the image.  Lung volumes are low.  The left basilar opacity favored to represent subsegmental atelectasis.  No definite consolidative airspace disease.  Possible trace left pleural effusion.  No evidence of pulmonary edema.  Heart size and mediastinal contours are within normal limits allowing for patient rotation to the left.  Atherosclerosis of the thoracic aorta.  IMPRESSION: 1.  Support apparatus, as above. 2.  Persistent left lower lobe subsegmental atelectasis and trace left pleural effusion. 3.  Atherosclerosis.   Original Report Authenticated By: Trudie Reed, M.D.   Dg Chest Port 1 View  05/24/2013   *RADIOLOGY REPORT*  Clinical Data: Endotracheal tube placement  PORTABLE CHEST - 1 VIEW  Comparison: Prior chest x-ray 05/22/2013  Findings: Endotracheal tube is 3.5 cm above the carina.  The enteric feeding tube tip is not visualized but lies below the diaphragm, presumably within the stomach or proximal small bowel. Stable appearance of the chest with bilateral layering pleural effusions and associated bibasilar opacities.  Unchanged cardiomegaly.  No pneumothorax or acute osseous abnormality.  IMPRESSION: No significant interval change in the appearance of chest.   Original Report Authenticated By: Malachy Moan, M.D.    Medications:  I have reviewed the patient's current medications. Scheduled: . antiseptic oral rinse  1 application Mouth Rinse QID  . aspirin  325 mg Per Tube Daily  . chlorhexidine  15 mL Mouth/Throat BID  . enoxaparin (LOVENOX) injection  40 mg Subcutaneous Q24H  . feeding supplement  30 mL Per Tube 5 X Daily  . free water  200 mL Per Tube Q6H  . insulin aspart  0-20 Units Subcutaneous Q4H  . insulin glargine   40 Units Subcutaneous QHS  . lacosamide  200 mg Per Tube BID  . levETIRAcetam  1,000 mg Per Tube BID  . pantoprazole sodium  40 mg Per Tube Daily  . phenytoin  100 mg Per Tube TID  . 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 She has not had any pentobarbital for 2.5 weeks but remains essentially comatose. There are some brain stem findings on examination but other wise responses are minimal.  Prognosis remains poor for functional recovery.  Will continue to follow with you.    LOS: 23 days   Thana Farr, MD [facility] [phone] 05/25/2013  6:52 PM

## 2013-05-25 NOTE — Procedures (Signed)
Central Venous Catheter Insertion Procedure Note Brenda Pope [id number] 11-14-1955  Procedure: Insertion of Central Venous Catheter Indications: Drug and/or fluid administration  Procedure Details Consent: Unable to obtain consent because of emergent medical necessity. Time Out: Verified patient identification, verified procedure, site/side was marked, verified correct patient position, special equipment/implants available, medications/allergies/relevent history reviewed, required imaging and test results available.  Performed  Maximum sterile technique was used including antiseptics, cap, gloves, gown, hand hygiene, mask and sheet. Skin prep: Chlorhexidine; local anesthetic administered A antimicrobial bonded/coated triple lumen catheter was placed in the left femoral vein due to multiple attempts, no other available access using the Seldinger technique.  Evaluation Blood flow good Complications: No apparent complications Patient did tolerate procedure well. Chest X-ray ordered to verify placement.  CXR: n/a.   U/S used in placement.  Alyson Reedy, M.D. [facility] Pulmonary/Critical Care Medicine. Pager: [phone]. After hours pager: [phone].

## 2013-05-25 NOTE — Procedures (Signed)
Bedside Tracheostomy Insertion Procedure Note   Patient Details:   Name: Brenda Pope DOB: 18-Mar-1956 MRN: [id number]  Procedure: Tracheostomy  Pre Procedure Assessment: ET Tube Size:8.0 ET Tube secured at lip (cm): 22 Bite block in place: Yes Breath Sounds: Clear  Post Procedure Assessment: BP 161/57  Pulse 74  Temp(Src) 98.6 F (37 C) (Axillary)  Resp 14  Ht 6' (1.829 m)  Wt 277 lb 9 oz (125.9 kg)  BMI 37.64 kg/m2  SpO2 98% O2 sats: stable throughout Complications: No apparent complications Patient did tolerate procedure well Tracheostomy Brand:Shiley Tracheostomy Style:Cuffed Tracheostomy Size: 6.0 Tracheostomy Secured WUJ:[id number] Tracheostomy Placement Confirmation:Trach cuff visualized and in place and Chest X ray ordered for placement    Leonard Downing 05/25/2013, 11:24 AM

## 2013-05-26 ENCOUNTER — Inpatient Hospital Stay (HOSPITAL_COMMUNITY): Payer: Medicaid Other

## 2013-05-26 ENCOUNTER — Encounter (HOSPITAL_COMMUNITY): Payer: Self-pay | Admitting: Radiology

## 2013-05-26 LAB — CBC
Hemoglobin: 8.4 g/dL — ABNORMAL LOW (ref 12.0–15.0)
MCH: 29.9 pg (ref 26.0–34.0)
RBC: 2.81 MIL/uL — ABNORMAL LOW (ref 3.87–5.11)

## 2013-05-26 LAB — BLOOD GAS, ARTERIAL
Acid-Base Excess: 5.2 mmol/L — ABNORMAL HIGH (ref 0.0–2.0)
Bicarbonate: 29.3 mEq/L — ABNORMAL HIGH (ref 20.0–24.0)
FIO2: 0.4 %
O2 Saturation: 100 %
Patient temperature: 98.6
TCO2: 30.7 mmol/L (ref 0–100)

## 2013-05-26 LAB — MAGNESIUM: Magnesium: 2.3 mg/dL (ref 1.5–2.5)

## 2013-05-26 LAB — PHOSPHORUS: Phosphorus: 3.5 mg/dL (ref 2.3–4.6)

## 2013-05-26 LAB — GLUCOSE, CAPILLARY
Glucose-Capillary: 176 mg/dL — ABNORMAL HIGH (ref 70–99)
Glucose-Capillary: 178 mg/dL — ABNORMAL HIGH (ref 70–99)
Glucose-Capillary: 179 mg/dL — ABNORMAL HIGH (ref 70–99)

## 2013-05-26 LAB — BASIC METABOLIC PANEL
GFR calc Af Amer: >90 mL/min (ref >90)
GFR calc non Af Amer: >90 mL/min (ref >90)
Glucose, Bld: 218 mg/dL — ABNORMAL HIGH (ref 70–99)
Potassium: 3.6 mEq/L (ref 3.5–5.1)
Sodium: 146 mEq/L — ABNORMAL HIGH (ref 135–145)

## 2013-05-26 MED ORDER — POTASSIUM CHLORIDE 20 MEQ/15ML (10%) PO LIQD
40.0000 meq | Freq: Three times a day (TID) | ORAL | Status: AC
  Administered 2013-05-26 (×2): 40 meq
  Filled 2013-05-26 (×2): qty 30

## 2013-05-26 MED ORDER — VANCOMYCIN HCL IN DEXTROSE 1-5 GM/200ML-% IV SOLN
1000.0000 mg | Freq: Once | INTRAVENOUS | Status: AC
  Administered 2013-05-27: 1000 mg via INTRAVENOUS
  Filled 2013-05-26 (×2): qty 200

## 2013-05-26 MED ORDER — ENOXAPARIN SODIUM 40 MG/0.4ML ~~LOC~~ SOLN
40.0000 mg | SUBCUTANEOUS | Status: DC
  Administered 2013-05-26 – 2013-06-16 (×22): 40 mg via SUBCUTANEOUS
  Filled 2013-05-26 (×23): qty 0.4

## 2013-05-26 NOTE — H&P (Signed)
Agree with PA note.  Signed,  Tonica Brasington K. Akai Dollard, MD [facility]  

## 2013-05-26 NOTE — Progress Notes (Signed)
PULMONARY  / CRITICAL CARE MEDICINE  Name: Brenda Pope MRN: [id number] DOB: 02/29/1956    ADMISSION DATE:  05/02/2013 CONSULTATION DATE:  7/7/104  REFERRING MD :  Transfer from [facility] PRIMARY SERVICE:  PCCM  CHIEF COMPLAINT:  Status epilepticus  BRIEF PATIENT DESCRIPTION: 57 y/o with past medical history of CVAs/TIAs and diastolic heart failure, severe baseline hypertension with noncompliance, poorly controlled diabetes admitted to [facility] on 7/5 with acute dyspnea and suspected CHF exacerbation.  On the day of admission developed developed neurological findings consistent with acute CVA. 7/6 intubated for airway protection during suspected seizure activity.  Hypotensive post intubation requiring vasopressors. MRI confirmed small acute infarcts of the right parietal lobe. On 7/7 transferred to [facility] for further management.  SIGNIFICANT EVENTS / STUDIES:  7/05 -  Admitted to [facility] with CHF exacerbation; stroke symptoms 7/06 - Suspected seizure activity, intubated for airway protection; hypotensive post intubation requiring vasopressors 7/06 -  Brain MRI:  Multiple small acute infarcts R parietal lobe, possible small infarcts left basal ganglia and central pons 7/06 - TTE:  No source of CVA, EF 55-60% 7/06 - Carotid Doppler:  No evidence of hemodynamically significant stenosis 7/07 - Transferred to [facility], PCCM serivce 7/07 - Neuro consult: "Concern patient is on SE in the context of recent cortical stroke." 7/08 - MRI brain: Confluent and scattered right MCA and right PCA acute infarcts, plus scattered small left hemisphere and occasional posterior fossa acute infarcts. The vast majority of these infarcts are newly seen since 05/01/2013.  This appearance suggests embolic phenomena which severely affected more proximal/medium-sized vessels of the right MCA and PCA territories. Cytotoxic edema without significant mass effect at this time. No associated hemorrhage 7/08 -  EEG: severe diffuse encephalopathic process with asymmetrical involvement. Findings reported from the left hemisphere were nonspecific. Findings record from the right hemisphere were consistent with acute large area of infarction. No frank epileptiform activity was recorded. 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  7/10 - LP performed by Neuro:  7/10 - Burst suppression with propofol. Norepi initiated to prevent propofol induced hypotension. Consideration of primary CNS vasculitis. Serologies ordered>> CRP, ESR markedly elevated. High dose methylpred ordered. Insulin gtt ordered while on high dose steriods 7/10 - propofol ineffective at burst suppression. pentobarbitol initiated 7/11 - CT head: Right frontal lobe, right parietal lobe, right occipital lobe and posterior right temporal lobe acute/subacute infarcts. Surrounding edema with local mass effect. Minimal petechial hemorrhage suspected particularly along the superior margin of the infarct. No gross hemorrhage noted. No midline shift 7/11 - angiogram eval for vasculitis: smooth focal areas of narrowing of prox PCAs and distal pericallosal. Subcortical branches, suspicious of vasculitis. 7/14 - TEE: no evidence of thrombus 7/20 - MRI brain: Overall mildly regressed signal abnormality in the brain associated with multi focal infarcts. The most confluent infarcts in the right posterior MCA and PCA territories do demonstrate persistent restricted diffusion, but less cytotoxic edema. There is trace petechial hemorrhage, but no malignant hemorrhagic transformation or associated mass effect. Slight extension of a central pontine lacunar infarct (pontine perforator artery territory) without mass effect or hemorrhage 7/21 - EEG: severe encephalopathy, nonspecific as to cause. No electrography seizures  noted.  7/22 - Family indicates that they do not wish to pursue trach/G-tube and SNF. However, they are not ready to "give up" yet citing their concern  that all of the meds used to induced coma might not have fully washed out of her system yet. I have noted that she developed severe hypernatremia and correcting this might improve her MS. We will continue our support for a couple more days. She will be DNR in event of cardiac arrest 7/24 - Family plans on one way extubation when daughter arrives back in town 7/25 or 7/26 7/27 - persistent request from family for one more EEG to ensure slowing before removal of ETT / comfort care 7/28 - In light of pt following commands this AM, family is now considering Trach and rehab for patient. Husband to decide in the coming days.  7/28- Case manager explained that LTAC is not an option for this patient because she is uninsured.  7/29- Family has decided to do trach. Trach scheduled for 10am 7/29. Husband remains unsure about PEG tube.  7/30- Trach completed by Dr. Molli Knock 7/31- Family ok with placement of Gastric tube per IR    LINES / TUBES: L radial A-line 7/10 >> 7/12 R IJ CVL 7/6 >> 7/21 ETT 7/6 >> 7/30 Trach (JY) 7/30  >>>     CULTURES: MRSA PCR 7/07 >> POS Blood culture >>ng CSF 7/10 >> ng   ANTIBIOTICS: None  SUBJ:  No acute events, patient afebrile. Trach in place. Patient unable to follow commands.     VITAL SIGNS: Temp:  [98.1 F (36.7 C)-98.5 F (36.9 C)] 98.5 F (36.9 C) (07/31 0343) Pulse Rate:  [57-91] 74 (07/31 0900) Resp:  [13-24] 15 (07/31 0900) BP: (138-187)/(44-80) 149/54 mmHg (07/31 0900) SpO2:  [96 %-100 %] 97 % (07/31 0900) FiO2 (%):  [40 %-100 %] 40 % (07/31 0900)  HEMODYNAMICS:   VENTILATOR SETTINGS: Vent Mode:  [-] PRVC FiO2 (%):  [40 %-100 %] 40 % Set Rate:  [14 bmp] 14 bmp Vt Set:  [500 mL] 500 mL PEEP:  [5 cmH20] 5 cmH20 Plateau Pressure:  [10 cmH20-16 cmH20] 16 cmH20  INTAKE / OUTPUT: Intake/Output      07/30 0701 - 07/31 0700 07/31 0701 - 08/01 0700   I.V. (mL/kg) 369.7 (2.9) 20 (0.2)   NG/GT 1320 90   Total Intake(mL/kg) 1689.7 (13.4) 110 (0.9)   Urine (mL/kg/hr) 1740 (0.6) 75 (0.2)   Total Output 1740 75   Net -50.3 +35        Stool Occurrence 2 x      PHYSICAL EXAMINATION: General: chronically ill trach in place, not following commands this AM Neuro: opens eyes to voice, following commands intermittently. Less this AM HEENT: PERRL Cardiovascular:  RRR s M Lungs: resps even, non labored on vent, few scattered rhonchi  Abdomen:  Soft, nontender, bowel sounds diminished Musculoskeletal: warm, pale, trace edema bilateral lower extremities  LABS: BMET  Recent Labs Lab 05/22/13 0941 05/23/13 0535 05/24/13 0335 05/25/13 0540 05/26/13 0425  NA 146* 147* 147* 146* 146*  K 3.5 4.2 3.6 3.6 3.6  CL 111 112 112 110 110  CO2 27 28 30 30 31   GLUCOSE 196* 233* 228* 184* 218*  BUN 40* 41* 43* 45* 39*  CREATININE 0.59 0.58 0.62 0.59 0.57  CALCIUM 8.4 8.6 8.6 8.7 8.6  MG  --   --  2.3 2.3 2.3  PHOS  --   --  3.1 3.2 3.5   CBC  Recent Labs Lab 05/24/13 0335 05/25/13 0540 05/26/13 0425  HGB 8.2* 7.9* 8.4*  HCT 25.2* 24.2* 25.4*  WBC 5.2 4.3 5.3  PLT 199 197  211    CXR:  Stable, no new findings. Basilar atelectasis    ASSESSMENT / PLAN:  PULMONARY A:   Acute respiratory failure due to AMS - Trach placed 7/30 P:   - Cont PSV as tolerated - Cont vent bundle - Trach done today at 10AM by Dr. Molli Knock  CARDIOVASCULAR  A:  Propofol/pentobarb induced hypotension - resolved H/O CHF - compensated Chronic hypertension P:  Monitor  RENAL A:   Acute on CRI - nonoliguric Hypernatremia P:   Monitor BMET Correct electrolytes and free water deficit as needed   GASTROINTESTINAL A:  No issues P:   -Cont TFs   HEMATOLOGIC A:  Mild anemia. P:  -Monitor CBC intermittently  -On LMWH for DVT prophy   INFECTIOUS A:  No overt infections P:   - Micro and  abx as above - Trend fever curve and CBC    ENDOCRINE  A:  DM2. Hyperglycemia. 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   7/27:  approx 1 hour spent with family regarding natural progression of current brain injury / end of life care, long term vent / trach support etc.    7/29: Meeting with Case Manager and family to discuss future plans regarding Janina Mayo, after an extensive discussion, husband finally made a decision that he would like for the patient to be trached but remains unsure about a feeding tube.  Despite my best efforts to convince him of need for PEG he remains unsure about it.  Will trach on 7/30 at 10 AM.   7/30: Trach done by Dr. Molli Knock  7/31: Family aggrees with placement of gastric tube by IR  Victorino Dike Little PA-S  Spoke with husband today, ok with peg now that trach is in place, will attempt TC today with vent support overnight then TC again 24/7 in AM if tolerated.  CC time 35 min.  Patient seen and examined, agree with above note.  I dictated the care and orders written for this patient under my direction.  Alyson Reedy, MD [phone]

## 2013-05-26 NOTE — H&P (Signed)
HPI: Brenda Pope is an 57 y.o. female who presented with a CVA and remains intubated. Patient currently has NG in place and request has been made for percutaneous gastric tube placement for nutrition.   Past Medical History:  Past Medical History  Diagnosis Date  . Stroke   . Diabetes mellitus without complication   . Hypertension   . CHF (congestive heart failure)     Past Surgical History: History reviewed. No pertinent past surgical history.  Family History: History reviewed. No pertinent family history.  Social History:  reports that she has never smoked. She has never used smokeless tobacco. Her alcohol and drug histories are not on file.  Allergies:  Allergies  Allergen Reactions  . Penicillins Nausea And Vomiting      Medication List    ASK your doctor about these medications       albuterol 108 (90 BASE) MCG/ACT inhaler  Commonly known as:  PROVENTIL HFA;VENTOLIN HFA  Inhale 2 puffs into the lungs every 6 (six) hours as needed for wheezing.     albuterol (2.5 MG/3ML) 0.083% nebulizer solution  Commonly known as:  PROVENTIL  Take 2.5 mg by nebulization every 6 (six) hours as needed for wheezing.     aspirin EC 81 MG tablet  Take 162 mg by mouth 2 (two) times daily.     buPROPion 75 MG tablet  Commonly known as:  WELLBUTRIN  Take 75 mg by mouth daily.     candesartan 32 MG tablet  Commonly known as:  ATACAND  Take 16 mg by mouth 2 (two) times daily.     clopidogrel 75 MG tablet  Commonly known as:  PLAVIX  Take 75 mg by mouth daily.     EXCEDRIN MIGRAINE PO  Take 1 tablet by mouth daily as needed. For headache     fluticasone 50 MCG/ACT nasal spray  Commonly known as:  FLONASE  Place 2 sprays into the nose daily.     furosemide 40 MG tablet  Commonly known as:  LASIX  Take 80 mg by mouth 2 (two) times daily.     metoprolol succinate 100 MG 24 hr tablet  Commonly known as:  TOPROL-XL  Take 100 mg by mouth daily. Take with or immediately following a  meal.     omeprazole 40 MG capsule  Commonly known as:  PRILOSEC  Take 40 mg by mouth daily.     promethazine 25 MG tablet  Commonly known as:  PHENERGAN  Take 25 mg by mouth every 8 (eight) hours as needed for nausea.     SALINE NA  Place 1 spray into the nose 2 (two) times daily as needed. For nasal congestion     spironolactone 25 MG tablet  Commonly known as:  ALDACTONE  Take 25 mg by mouth daily.     TRILIPIX 135 MG capsule  Generic drug:  Choline Fenofibrate  Take 135 mg by mouth daily.       Please HPI for pertinent positives, otherwise complete 10 system ROS negative.  Physical Exam: BP 164/58  Pulse 78  Temp(Src) 99.9 F (37.7 C) (Axillary)  Resp 25  Ht 6' (1.829 m)  Wt 277 lb 9 oz (125.9 kg)  BMI 37.64 kg/m2  SpO2 96% Body mass index is 37.64 kg/(m^2).  General Appearance:  On ventilator  Head:  Normocephalic, without obvious abnormality, atraumatic  Lungs:   On ventilator, scattered rhonchi  Chest Wall:  No tenderness or deformity  Heart:  Regular rate  and rhythm, S1, S2 normal, no murmur, rub or gallop.  Abdomen:   Soft, non-tender, non distended, (+ hypoactive) BS  Extremities: Extremities with trace edema, no cyanosis   Results for orders placed during the hospital encounter of 05/02/13 (from the past 48 hour(s))  GLUCOSE, CAPILLARY     Status: Abnormal   Collection Time    05/24/13  3:42 PM      Result Value Range   Glucose-Capillary 193 (*) 70 - 99 mg/dL   Comment 1 Notify RN     Comment 2 Documented in Chart    GLUCOSE, CAPILLARY     Status: Abnormal   Collection Time    05/24/13  7:48 PM      Result Value Range   Glucose-Capillary 210 (*) 70 - 99 mg/dL   Comment 1 Notify RN     Comment 2 Documented in Chart    GLUCOSE, CAPILLARY     Status: Abnormal   Collection Time    05/25/13 12:02 AM      Result Value Range   Glucose-Capillary 240 (*) 70 - 99 mg/dL   Comment 1 Notify RN     Comment 2 Documented in Chart    BLOOD GAS, ARTERIAL      Status: Abnormal   Collection Time    05/25/13  4:05 AM      Result Value Range   FIO2 0.40     Delivery systems VENTILATOR     Mode PRESSURE REGULATED VOLUME CONTROL     VT 500     Rate 14     Peep/cpap 5.0     pH, Arterial 7.469 (*) 7.350 - 7.450   pCO2 arterial 41.6  35.0 - 45.0 mmHg   pO2, Arterial 162.0 (*) 80.0 - 100.0 mmHg   Bicarbonate 29.9 (*) 20.0 - 24.0 mEq/L   TCO2 31.2  0 - 100 mmol/L   Acid-Base Excess 6.1 (*) 0.0 - 2.0 mmol/L   O2 Saturation 99.8     Patient temperature 98.6     Collection site RIGHT RADIAL     Drawn by [phone]     Sample type ARTERIAL DRAW     Allens test (pass/fail) PASS  PASS  GLUCOSE, CAPILLARY     Status: Abnormal   Collection Time    05/25/13  4:30 AM      Result Value Range   Glucose-Capillary 162 (*) 70 - 99 mg/dL  CBC     Status: Abnormal   Collection Time    05/25/13  5:40 AM      Result Value Range   WBC 4.3  4.0 - 10.5 K/uL   RBC 2.64 (*) 3.87 - 5.11 MIL/uL   Hemoglobin 7.9 (*) 12.0 - 15.0 g/dL   HCT 60.4 (*) 54.0 - 98.1 %   MCV 91.7  78.0 - 100.0 fL   MCH 29.9  26.0 - 34.0 pg   MCHC 32.6  30.0 - 36.0 g/dL   RDW 19.1  47.8 - 29.5 %   Platelets 197  150 - 400 K/uL  BASIC METABOLIC PANEL     Status: Abnormal   Collection Time    05/25/13  5:40 AM      Result Value Range   Sodium 146 (*) 135 - 145 mEq/L   Potassium 3.6  3.5 - 5.1 mEq/L   Chloride 110  96 - 112 mEq/L   CO2 30  19 - 32 mEq/L   Glucose, Bld 184 (*) 70 - 99 mg/dL  BUN 45 (*) 6 - 23 mg/dL   Creatinine, Ser 4.09  0.50 - 1.10 mg/dL   Calcium 8.7  8.4 - 81.1 mg/dL   GFR calc non Af Amer >90  >90 mL/min   GFR calc Af Amer >90  >90 mL/min   Comment:            The eGFR has been calculated     using the CKD EPI equation.     This calculation has not been     validated in all clinical     situations.     eGFR's persistently     <90 mL/min signify     possible Chronic Kidney Disease.  MAGNESIUM     Status: None   Collection Time    05/25/13  5:40 AM       Result Value Range   Magnesium 2.3  1.5 - 2.5 mg/dL  PHOSPHORUS     Status: None   Collection Time    05/25/13  5:40 AM      Result Value Range   Phosphorus 3.2  2.3 - 4.6 mg/dL  PROTIME-INR     Status: None   Collection Time    05/25/13  5:40 AM      Result Value Range   Prothrombin Time 14.3  11.6 - 15.2 seconds   INR 1.13  0.00 - 1.49  APTT     Status: None   Collection Time    05/25/13  5:40 AM      Result Value Range   aPTT 29  24 - 37 seconds  GLUCOSE, CAPILLARY     Status: Abnormal   Collection Time    05/25/13  8:27 AM      Result Value Range   Glucose-Capillary 148 (*) 70 - 99 mg/dL  GLUCOSE, CAPILLARY     Status: Abnormal   Collection Time    05/25/13 11:27 AM      Result Value Range   Glucose-Capillary 121 (*) 70 - 99 mg/dL  GLUCOSE, CAPILLARY     Status: Abnormal   Collection Time    05/25/13  4:08 PM      Result Value Range   Glucose-Capillary 125 (*) 70 - 99 mg/dL  GLUCOSE, CAPILLARY     Status: Abnormal   Collection Time    05/25/13  7:37 PM      Result Value Range   Glucose-Capillary 147 (*) 70 - 99 mg/dL   Comment 1 Notify RN     Comment 2 Documented in Chart    GLUCOSE, CAPILLARY     Status: Abnormal   Collection Time    05/25/13 11:36 PM      Result Value Range   Glucose-Capillary 176 (*) 70 - 99 mg/dL   Comment 1 Notify RN     Comment 2 Documented in Chart    GLUCOSE, CAPILLARY     Status: Abnormal   Collection Time    05/26/13  3:40 AM      Result Value Range   Glucose-Capillary 179 (*) 70 - 99 mg/dL   Comment 1 Notify RN     Comment 2 Documented in Chart    BLOOD GAS, ARTERIAL     Status: Abnormal   Collection Time    05/26/13  4:06 AM      Result Value Range   FIO2 0.40     Delivery systems VENTILATOR     Mode PRESSURE REGULATED VOLUME CONTROL     VT 500  Rate 14     Peep/cpap 5.0     pH, Arterial 7.439  7.350 - 7.450   pCO2 arterial 44.0  35.0 - 45.0 mmHg   pO2, Arterial 159.0 (*) 80.0 - 100.0 mmHg   Bicarbonate 29.3 (*)  20.0 - 24.0 mEq/L   TCO2 30.7  0 - 100 mmol/L   Acid-Base Excess 5.2 (*) 0.0 - 2.0 mmol/L   O2 Saturation 100.0     Patient temperature 98.6     Collection site LEFT RADIAL     Drawn by [phone]     Sample type ARTERIAL DRAW     Allens test (pass/fail) PASS  PASS  BASIC METABOLIC PANEL     Status: Abnormal   Collection Time    05/26/13  4:25 AM      Result Value Range   Sodium 146 (*) 135 - 145 mEq/L   Potassium 3.6  3.5 - 5.1 mEq/L   Chloride 110  96 - 112 mEq/L   CO2 31  19 - 32 mEq/L   Glucose, Bld 218 (*) 70 - 99 mg/dL   BUN 39 (*) 6 - 23 mg/dL   Creatinine, Ser 4.13  0.50 - 1.10 mg/dL   Calcium 8.6  8.4 - 24.4 mg/dL   GFR calc non Af Amer >90  >90 mL/min   GFR calc Af Amer >90  >90 mL/min   Comment:            The eGFR has been calculated     using the CKD EPI equation.     This calculation has not been     validated in all clinical     situations.     eGFR's persistently     <90 mL/min signify     possible Chronic Kidney Disease.  CBC     Status: Abnormal   Collection Time    05/26/13  4:25 AM      Result Value Range   WBC 5.3  4.0 - 10.5 K/uL   RBC 2.81 (*) 3.87 - 5.11 MIL/uL   Hemoglobin 8.4 (*) 12.0 - 15.0 g/dL   HCT 01.0 (*) 27.2 - 53.6 %   MCV 90.4  78.0 - 100.0 fL   MCH 29.9  26.0 - 34.0 pg   MCHC 33.1  30.0 - 36.0 g/dL   RDW 64.4  03.4 - 74.2 %   Platelets 211  150 - 400 K/uL  MAGNESIUM     Status: None   Collection Time    05/26/13  4:25 AM      Result Value Range   Magnesium 2.3  1.5 - 2.5 mg/dL  PHOSPHORUS     Status: None   Collection Time    05/26/13  4:25 AM      Result Value Range   Phosphorus 3.5  2.3 - 4.6 mg/dL  GLUCOSE, CAPILLARY     Status: Abnormal   Collection Time    05/26/13  8:27 AM      Result Value Range   Glucose-Capillary 208 (*) 70 - 99 mg/dL  GLUCOSE, CAPILLARY     Status: Abnormal   Collection Time    05/26/13 12:25 PM      Result Value Range   Glucose-Capillary 158 (*) 70 - 99 mg/dL   Dg Chest Port 1  View  05/26/2013   *RADIOLOGY REPORT*  Clinical Data: Tracheostomy placement.  PORTABLE CHEST - 1 VIEW  Comparison: 05/25/2013.  Findings: Tracheostomy is present.  Feeding tube is also present  with the tip not visualized.  Patient is rotated to the left. Allowing for rotation, there is no interval change.  Bilateral basilar atelectasis is present, prominent in the retrocardiac region.  No airspace disease/consolidation.  No pneumothorax. Monitoring leads are projected over the chest.  IMPRESSION: Unchanged support apparatus.  Basilar atelectasis.   Original Report Authenticated By: Andreas Newport, M.D.   Chest Portable 1 View To Assess Tube Placement And Rule-out Pneumothorax  05/25/2013   *RADIOLOGY REPORT*  Clinical Data: Tracheostomy tube placement  PORTABLE CHEST - 1 VIEW  Comparison: Same date.  Findings: Tracheostomy tube is seen in grossly good position with distal tip approximately 2 cm above the carina.  Dobbhoff tube is seen passing through esophagus into stomach.  No pneumothorax is noted.  No acute pulmonary disease is noted.  Bony thorax is intact.  IMPRESSION: Tracheostomy tube in grossly good position.  No pneumothorax is noted.   Original Report Authenticated By: Lupita Raider.,  M.D.   Dg Chest Port 1 View  05/25/2013   *RADIOLOGY REPORT*  Clinical Data: Evaluate endotracheal tube position.  PORTABLE CHEST - 1 VIEW  Comparison: Chest x-ray 05/24/2013.  Findings: An endotracheal tube is in place with tip 3.8 cm above the carina. A feeding tube is seen extending into the abdomen, however, the tip of the feeding tube extends below the lower margin of the image.  Lung volumes are low.  The left basilar opacity favored to represent subsegmental atelectasis.  No definite consolidative airspace disease.  Possible trace left pleural effusion.  No evidence of pulmonary edema.  Heart size and mediastinal contours are within normal limits allowing for patient rotation to the left.  Atherosclerosis of  the thoracic aorta.  IMPRESSION: 1.  Support apparatus, as above. 2.  Persistent left lower lobe subsegmental atelectasis and trace left pleural effusion. 3.  Atherosclerosis.   Original Report Authenticated By: Trudie Reed, M.D.    Assessment/Plan CVA Acute respiratory failure s/p tracheostomy. Malnutrition, currently with NG tube, request for percutaneous gastric tube placement.  Labs reviewed, afebrile, check kub in am, patient is not currently receiving plavix, lovenox being held on 8/1.  Plan for G-tube placement on 05/27/13.  Risks and Benefits discussed with the patient's husband. All of the husband's questions were answered, he is agreeable to proceed. Consent signed and in chart.   Pattricia Boss D PA-C 05/26/2013, 2:18 PM

## 2013-05-26 NOTE — Progress Notes (Addendum)
Subjective: Patient remains comatose.  Unresponsive.  No evidence of seizure activity.  Objective: Current vital signs: BP 149/54  Pulse 74  Temp(Src) 98.5 F (36.9 C) (Axillary)  Resp 15  Ht 6' (1.829 m)  Wt 125.9 kg (277 lb 9 oz)  BMI 37.64 kg/m2  SpO2 97% Vital signs in last 24 hours: Temp:  [98.1 F (36.7 C)-98.5 F (36.9 C)] 98.5 F (36.9 C) (07/31 0343) Pulse Rate:  [57-91] 74 (07/31 0900) Resp:  [13-24] 15 (07/31 0900) BP: (138-187)/(44-80) 149/54 mmHg (07/31 0900) SpO2:  [96 %-100 %] 97 % (07/31 0900) FiO2 (%):  [40 %-100 %] 40 % (07/31 0900)  Intake/Output from previous day: 07/30 0701 - 07/31 0700 In: 1689.7 [I.V.:369.7; NG/GT:1320] Out: 1740 [Urine:1740] Intake/Output this shift: Total I/O In: 110 [I.V.:20; NG/GT:90] Out: 75 [Urine:75] Nutritional status: NPO  Neurologic Exam: Mental Status: Patient does not respond to verbal stimuli.  Does not respond to deep sternal rub.  Does not follow commands.  No verbalizations noted.  Cranial Nerves: II: patient does not respond confrontation bilaterally, pupils right 3 mm, left 3 mm,and reactive bilaterally III,IV,VI: doll's response absent bilaterally.  V,VII: corneal reflex reduced bilaterally  VIII: patient does not respond to verbal stimuli IX,X: gag reflex not tested, XI: trapezius strength unable to test bilaterally XII: tongue strength unable to test Motor: Extremities flaccid throughout.  No spontaneous movement noted.  No purposeful movements noted. Sensory: Does not respond to noxious stimuli in any extremity. Deep Tendon Reflexes:  Absent throughout. Plantars: mute bilaterally Cerebellar: Unable to perform    Lab Results: Basic Metabolic Panel:  Recent Labs Lab 05/22/13 0941 05/23/13 0535 05/24/13 0335 05/25/13 0540 05/26/13 0425  NA 146* 147* 147* 146* 146*  K 3.5 4.2 3.6 3.6 3.6  CL 111 112 112 110 110  CO2 27 28 30 30 31   GLUCOSE 196* 233* 228* 184* 218*  BUN 40* 41* 43* 45*  39*  CREATININE 0.59 0.58 0.62 0.59 0.57  CALCIUM 8.4 8.6 8.6 8.7 8.6  MG  --   --  2.3 2.3 2.3  PHOS  --   --  3.1 3.2 3.5    Liver Function Tests: No results found for this basename: AST, ALT, ALKPHOS, BILITOT, PROT, ALBUMIN,  in the last 168 hours No results found for this basename: LIPASE, AMYLASE,  in the last 168 hours No results found for this basename: AMMONIA,  in the last 168 hours  CBC:  Recent Labs Lab 05/22/13 0941 05/23/13 0535 05/24/13 0335 05/25/13 0540 05/26/13 0425  WBC 7.6 5.9 5.2 4.3 5.3  HGB 8.2* 8.4* 8.2* 7.9* 8.4*  HCT 24.5* 25.0* 25.2* 24.2* 25.4*  MCV 91.1 91.2 91.6 91.7 90.4  PLT 182 179 199 197 211    Cardiac Enzymes: No results found for this basename: CKTOTAL, CKMB, CKMBINDEX, TROPONINI,  in the last 168 hours  Lipid Panel: No results found for this basename: CHOL, TRIG, HDL, CHOLHDL, VLDL, LDLCALC,  in the last 168 hours  CBG:  Recent Labs Lab 05/25/13 1608 05/25/13 1937 05/25/13 2336 05/26/13 0340 05/26/13 0827  GLUCAP 125* 147* 176* 179* 208*    Microbiology: Results for orders placed during the hospital encounter of 05/02/13  MRSA PCR SCREENING     Status: Abnormal   Collection Time    05/02/13 10:25 PM      Result Value Range Status   MRSA by PCR POSITIVE (*) NEGATIVE Final   Comment:  The GeneXpert MRSA Assay (FDA     approved for NASAL specimens     only), is one component of a     comprehensive MRSA colonization     surveillance program. It is not     intended to diagnose MRSA     infection nor to guide or     monitor treatment for     MRSA infections.     RESULT CALLED TO, READ BACK BY AND VERIFIED WITH:     GRAHAM,K RN [id number] 05/03/13 MITCHELL,L  CSF CULTURE     Status: None   Collection Time    05/05/13 12:43 PM      Result Value Range Status   Specimen Description CSF   Final   Special Requests 1.0ML FLUID   Final   Gram Stain     Final   Value: CYTOSPIN SLIDE WBC PRESENT,BOTH PMN AND MONONUCLEAR      NO ORGANISMS SEEN   Culture NO GROWTH 3 DAYS   Final   Report Status 05/08/2013 FINAL   Final  GRAM STAIN     Status: None   Collection Time    05/05/13 12:43 PM      Result Value Range Status   Specimen Description CSF   Final   Special Requests 1.0ML FLUID   Final   Gram Stain     Final   Value: CYTOSPIN SLIDE:     WBC PRESENT,BOTH PMN AND MONONUCLEAR     NO ORGANISMS SEEN   Report Status 05/05/2013 FINAL   Final  CULTURE, BLOOD (ROUTINE X 2)     Status: None   Collection Time    05/05/13  2:50 PM      Result Value Range Status   Specimen Description BLOOD LEFT FINGER   Final   Special Requests BOTTLES DRAWN AEROBIC ONLY [facility]   Final   Culture  Setup Time 05/05/2013 21:09   Final   Culture NO GROWTH 5 DAYS   Final   Report Status 05/11/2013 FINAL   Final  CULTURE, BLOOD (ROUTINE X 2)     Status: None   Collection Time    05/05/13  3:03 PM      Result Value Range Status   Specimen Description BLOOD LEFT ARM   Final   Special Requests BOTTLES DRAWN AEROBIC AND ANAEROBIC 10CC   Final   Culture  Setup Time 05/05/2013 21:09   Final   Culture NO GROWTH 5 DAYS   Final   Report Status 05/11/2013 FINAL   Final    Coagulation Studies:  Recent Labs  05/25/13 0540  LABPROT 14.3  INR 1.13    Imaging: Dg Chest Port 1 View  05/26/2013   *RADIOLOGY REPORT*  Clinical Data: Tracheostomy placement.  PORTABLE CHEST - 1 VIEW  Comparison: 05/25/2013.  Findings: Tracheostomy is present.  Feeding tube is also present with the tip not visualized.  Patient is rotated to the left. Allowing for rotation, there is no interval change.  Bilateral basilar atelectasis is present, prominent in the retrocardiac region.  No airspace disease/consolidation.  No pneumothorax. Monitoring leads are projected over the chest.  IMPRESSION: Unchanged support apparatus.  Basilar atelectasis.   Original Report Authenticated By: Andreas Newport, M.D.   Chest Portable 1 View To Assess Tube Placement And Rule-out  Pneumothorax  05/25/2013   *RADIOLOGY REPORT*  Clinical Data: Tracheostomy tube placement  PORTABLE CHEST - 1 VIEW  Comparison: Same date.  Findings: Tracheostomy tube is seen in grossly good position  with distal tip approximately 2 cm above the carina.  Dobbhoff tube is seen passing through esophagus into stomach.  No pneumothorax is noted.  No acute pulmonary disease is noted.  Bony thorax is intact.  IMPRESSION: Tracheostomy tube in grossly good position.  No pneumothorax is noted.   Original Report Authenticated By: Lupita Raider.,  M.D.   Dg Chest Port 1 View  05/25/2013   *RADIOLOGY REPORT*  Clinical Data: Evaluate endotracheal tube position.  PORTABLE CHEST - 1 VIEW  Comparison: Chest x-ray 05/24/2013.  Findings: An endotracheal tube is in place with tip 3.8 cm above the carina. A feeding tube is seen extending into the abdomen, however, the tip of the feeding tube extends below the lower margin of the image.  Lung volumes are low.  The left basilar opacity favored to represent subsegmental atelectasis.  No definite consolidative airspace disease.  Possible trace left pleural effusion.  No evidence of pulmonary edema.  Heart size and mediastinal contours are within normal limits allowing for patient rotation to the left.  Atherosclerosis of the thoracic aorta.  IMPRESSION: 1.  Support apparatus, as above. 2.  Persistent left lower lobe subsegmental atelectasis and trace left pleural effusion. 3.  Atherosclerosis.   Original Report Authenticated By: Trudie Reed, M.D.    Medications:  I have reviewed the patient's current medications. Scheduled: . antiseptic oral rinse  1 application Mouth Rinse QID  . aspirin  325 mg Per Tube Daily  . chlorhexidine  15 mL Mouth/Throat BID  . enoxaparin (LOVENOX) injection  40 mg Subcutaneous Q24H  . feeding supplement  30 mL Per Tube 5 X Daily  . free water  200 mL Per Tube Q6H  . insulin aspart  0-20 Units Subcutaneous Q4H  . insulin glargine  40 Units  Subcutaneous QHS  . lacosamide  200 mg Per Tube BID  . levETIRAcetam  1,000 mg Per Tube BID  . pantoprazole sodium  40 mg Per Tube Daily  . phenytoin  100 mg Per Tube TID  . 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 She has not had any pentobarbital for 2.5 weeks but remains essentially comatose. There are some brain stem findings on examination and shoe does show some daily fluctuations but responses remain minimal. Prognosis remains poor for functional recovery.    LOS: 24 days   Thana Farr, MD [facility] [phone] 05/26/2013  9:33 AM

## 2013-05-26 NOTE — Progress Notes (Signed)
Pt given 1 cup thin barium via ng tube today at 1600

## 2013-05-27 ENCOUNTER — Inpatient Hospital Stay (HOSPITAL_COMMUNITY): Payer: Medicaid Other

## 2013-05-27 LAB — CBC
HCT: 24.2 % — ABNORMAL LOW (ref 36.0–46.0)
MCH: 29.8 pg (ref 26.0–34.0)
MCV: 91.3 fL (ref 78.0–100.0)
Platelets: 229 10*3/uL (ref 150–400)
RBC: 2.65 MIL/uL — ABNORMAL LOW (ref 3.87–5.11)
WBC: 6.2 10*3/uL (ref 4.0–10.5)

## 2013-05-27 LAB — GLUCOSE, CAPILLARY
Glucose-Capillary: 184 mg/dL — ABNORMAL HIGH (ref 70–99)
Glucose-Capillary: 134 mg/dL — ABNORMAL HIGH (ref 70–99)
Glucose-Capillary: 140 mg/dL — ABNORMAL HIGH (ref 70–99)
Glucose-Capillary: 149 mg/dL — ABNORMAL HIGH (ref 70–99)
Glucose-Capillary: 138 mg/dL — ABNORMAL HIGH (ref 70–99)

## 2013-05-27 LAB — BASIC METABOLIC PANEL
BUN: 34 mg/dL — ABNORMAL HIGH (ref 6–23)
CO2: 31 mEq/L (ref 19–32)
Calcium: 8.6 mg/dL (ref 8.4–10.5)
Chloride: 113 mEq/L — ABNORMAL HIGH (ref 96–112)
Creatinine, Ser: 0.54 mg/dL (ref 0.50–1.10)
Glucose, Bld: 154 mg/dL — ABNORMAL HIGH (ref 70–99)

## 2013-05-27 LAB — MAGNESIUM: Magnesium: 2.4 mg/dL (ref 1.5–2.5)

## 2013-05-27 MED ORDER — FENTANYL CITRATE 0.05 MG/ML IJ SOLN
INTRAMUSCULAR | Status: AC
  Filled 2013-05-27: qty 2

## 2013-05-27 MED ORDER — INSULIN ASPART 100 UNIT/ML ~~LOC~~ SOLN
3.0000 [IU] | Freq: Three times a day (TID) | SUBCUTANEOUS | Status: DC
  Administered 2013-05-28 – 2013-05-30 (×6): 3 [IU] via SUBCUTANEOUS

## 2013-05-27 MED ORDER — SODIUM CHLORIDE 0.9 % IJ SOLN
10.0000 mL | INTRAMUSCULAR | Status: DC | PRN
  Administered 2013-05-29: 10 mL

## 2013-05-27 MED ORDER — GLUCAGON HCL (RDNA) 1 MG IJ SOLR
INTRAMUSCULAR | Status: AC
  Administered 2013-05-27: 1 mg via INTRAVENOUS
  Filled 2013-05-27: qty 1

## 2013-05-27 MED ORDER — IOHEXOL 300 MG/ML  SOLN
50.0000 mL | Freq: Once | INTRAMUSCULAR | Status: AC | PRN
  Administered 2013-05-27: 20 mL

## 2013-05-27 MED ORDER — POTASSIUM CHLORIDE 20 MEQ/15ML (10%) PO LIQD
40.0000 meq | Freq: Three times a day (TID) | ORAL | Status: AC
  Administered 2013-05-27 (×2): 40 meq
  Filled 2013-05-27 (×2): qty 30

## 2013-05-27 MED ORDER — MIDAZOLAM HCL 2 MG/2ML IJ SOLN
INTRAMUSCULAR | Status: AC | PRN
  Administered 2013-05-27: 2 mg via INTRAVENOUS

## 2013-05-27 MED ORDER — SODIUM CHLORIDE 0.9 % IJ SOLN
10.0000 mL | Freq: Two times a day (BID) | INTRAMUSCULAR | Status: DC
  Administered 2013-05-27 – 2013-05-28 (×3): 10 mL
  Administered 2013-05-28: 20 mL
  Administered 2013-05-29: 10 mL
  Administered 2013-05-30: 20 mL
  Administered 2013-05-30 – 2013-05-31 (×2): 10 mL
  Administered 2013-05-31 – 2013-06-03 (×6): 20 mL
  Administered 2013-06-03 – 2013-06-11 (×8): 10 mL
  Administered 2013-06-14: 20 mL
  Administered 2013-06-15: 10 mL
  Administered 2013-06-16: 20 mL
  Filled 2013-05-27 (×2): qty 20
  Filled 2013-05-27: qty 10
  Filled 2013-05-27: qty 20

## 2013-05-27 MED ORDER — FENTANYL CITRATE 0.05 MG/ML IJ SOLN
INTRAMUSCULAR | Status: AC | PRN
  Administered 2013-05-27: 25 ug via INTRAVENOUS

## 2013-05-27 NOTE — Progress Notes (Signed)
Trach team note  Pt with new trach yesterday, likely still on vent. Will have PEG today. SLP will follow for readiness for PMSV.  Harlon Ditty, MA CCC-SLP [phone]

## 2013-05-27 NOTE — Progress Notes (Signed)
PULMONARY  / CRITICAL CARE MEDICINE  Name: Brenda Pope MRN: [id number] DOB: 03-09-1956    ADMISSION DATE:  05/02/2013 CONSULTATION DATE:  7/7/104  REFERRING MD :  Transfer from [facility] PRIMARY SERVICE:  PCCM  CHIEF COMPLAINT:  Status epilepticus  BRIEF PATIENT DESCRIPTION: 58 y/o with past medical history of CVAs/TIAs and diastolic heart failure, severe baseline hypertension with noncompliance, poorly controlled diabetes admitted to [facility] on 7/5 with acute dyspnea and suspected CHF exacerbation.  On the day of admission developed developed neurological findings consistent with acute CVA. 7/6 intubated for airway protection during suspected seizure activity.  Hypotensive post intubation requiring vasopressors. MRI confirmed small acute infarcts of the right parietal lobe. On 7/7 transferred to [facility] for further management.  SIGNIFICANT EVENTS / STUDIES:  7/05 -  Admitted to [facility] with CHF exacerbation; stroke symptoms 7/06 - Suspected seizure activity, intubated for airway protection; hypotensive post intubation requiring vasopressors 7/06 -  Brain MRI:  Multiple small acute infarcts R parietal lobe, possible small infarcts left basal ganglia and central pons 7/06 - TTE:  No source of CVA, EF 55-60% 7/06 - Carotid Doppler:  No evidence of hemodynamically significant stenosis 7/07 - Transferred to [facility], PCCM serivce 7/07 - Neuro consult: "Concern patient is on SE in the context of recent cortical stroke." 7/08 - MRI brain: Confluent and scattered right MCA and right PCA acute infarcts, plus scattered small left hemisphere and occasional posterior fossa acute infarcts. The vast majority of these infarcts are newly seen since 05/01/2013.  This appearance suggests embolic phenomena which severely affected more proximal/medium-sized vessels of the right MCA and PCA territories. Cytotoxic edema without significant mass effect at this time. No associated hemorrhage 7/08 -  EEG: severe diffuse encephalopathic process with asymmetrical involvement. Findings reported from the left hemisphere were nonspecific. Findings record from the right hemisphere were consistent with acute large area of infarction. No frank epileptiform activity was recorded. 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  7/10 - LP performed by Neuro:  7/10 - Burst suppression with propofol. Norepi initiated to prevent propofol induced hypotension. Consideration of primary CNS vasculitis. Serologies ordered>> CRP, ESR markedly elevated. High dose methylpred ordered. Insulin gtt ordered while on high dose steriods 7/10 - propofol ineffective at burst suppression. pentobarbitol initiated 7/11 - CT head: Right frontal lobe, right parietal lobe, right occipital lobe and posterior right temporal lobe acute/subacute infarcts. Surrounding edema with local mass effect. Minimal petechial hemorrhage suspected particularly along the superior margin of the infarct. No gross hemorrhage noted. No midline shift 7/11 - angiogram eval for vasculitis: smooth focal areas of narrowing of prox PCAs and distal pericallosal. Subcortical branches, suspicious of vasculitis. 7/14 - TEE: no evidence of thrombus 7/20 - MRI brain: Overall mildly regressed signal abnormality in the brain associated with multi focal infarcts. The most confluent infarcts in the right posterior MCA and PCA territories do demonstrate persistent restricted diffusion, but less cytotoxic edema. There is trace petechial hemorrhage, but no malignant hemorrhagic transformation or associated mass effect. Slight extension of a central pontine lacunar infarct (pontine perforator artery territory) without mass effect or hemorrhage 7/21 - EEG: severe encephalopathy, nonspecific as to cause. No electrography seizures  noted.  7/22 - Family indicates that they do not wish to pursue trach/G-tube and SNF. However, they are not ready to "give up" yet citing their concern  that all of the meds used to induced coma might not have fully washed out of her system yet. I have noted that she developed severe hypernatremia and correcting this might improve her MS. We will continue our support for a couple more days. She will be DNR in event of cardiac arrest 7/24 - Family plans on one way extubation when daughter arrives back in town 7/25 or 7/26 7/27 - persistent request from family for one more EEG to ensure slowing before removal of ETT / comfort care 7/28 - In light of pt following commands this AM, family is now considering Trach and rehab for patient. Husband to decide in the coming days.  7/28- Case manager explained that LTAC is not an option for this patient because she is uninsured.  7/29- Family has decided to do trach. Trach scheduled for 10am 7/29. Husband remains unsure about PEG tube.  7/30- Trach completed by Dr. Molli Knock 7/31- Family ok with placement of Gastric tube per IR  8/1- Gastric tube to be placed today  LINES / TUBES: L radial A-line 7/10 >> 7/12 R IJ CVL 7/6 >> 7/21 ETT 7/6 >> 7/30 Trach (JY) 7/30  >>>    CULTURES: MRSA PCR 7/07 >> POS Blood culture >>ng CSF 7/10 >> ng  ANTIBIOTICS: 8/1 Vanc x 1 dose for PEG tube placement  SUBJ:  Percutaneous gastric tube to be placed 8/1. Patient afebrile. Patient tolerated TC through the night and this AM. She is following commands this morning!!!   VITAL SIGNS: Temp:  [98.2 F (36.8 C)-99.9 F (37.7 C)] 98.2 F (36.8 C) (08/01 0400) Pulse Rate:  [65-105] 75 (08/01 0700) Resp:  [14-33] 19 (08/01 0700) BP: (140-174)/(52-141) 163/69 mmHg (08/01 0700) SpO2:  [87 %-99 %] 97 % (08/01 0700) FiO2 (%):  [40 %] 40 % (08/01 0700)  HEMODYNAMICS:   VENTILATOR SETTINGS: Vent Mode:  [-] Spontaneous;Stand-by FiO2 (%):  [40 %] 40 % PEEP:  [5 cmH20] 5  cmH20 Pressure Support:  [8 cmH20] 8 cmH20  INTAKE / OUTPUT: Intake/Output     07/31 0701 - 08/01 0700 08/01 0701 - 08/02 0700   I.V. (mL/kg) 470 (3.7)    NG/GT 1180    Total Intake(mL/kg) 1650 (13.1)    Urine (mL/kg/hr) 1640 (0.5)    Total Output 1640     Net +10          Stool Occurrence 1 x      PHYSICAL EXAMINATION: General: chronically ill trach in place, will stick out her tongue and squeeze my hand this AM Neuro: opens eyes to voice, follows simple commands HEENT: PERRL Cardiovascular:  RRR s M Lungs: resps even, non labored on vent, few scattered rhonchi  Abdomen:  Soft, nontender, bowel sounds diminished Musculoskeletal: warm, pale, Trace edema bilateral lower extremity and 1+ bilateral upper extremity  LABS: BMET  Recent Labs Lab 05/23/13 0535 05/24/13 0335 05/25/13 0540 05/26/13 0425 05/27/13 0450  NA 147* 147* 146* 146* 149*  K 4.2 3.6 3.6 3.6 3.6  CL 112 112 110 110 113*  CO2 28 30 30 31 31   GLUCOSE 233* 228* 184* 218* 154*  BUN 41* 43* 45* 39* 34*  CREATININE 0.58 0.62 0.59 0.57 0.54  CALCIUM 8.6 8.6 8.7 8.6 8.6  MG  --  2.3 2.3 2.3 2.4  PHOS  --  3.1 3.2 3.5 2.9   CBC  Recent Labs Lab 05/25/13 0540 05/26/13 0425 05/27/13 0450  HGB 7.9* 8.4* 7.9*  HCT 24.2* 25.4* 24.2*  WBC  4.3 5.3 6.2  PLT 197 211 229   CXR:  Stable with no new findings  ASSESSMENT / PLAN:  PULMONARY A:   Acute respiratory failure due to AMS - Trach placed 7/30 P:   - Patient tolerating trach collar well will continue 24/7 if able but will likely need full support during G-tube placement today. - Plan for trach collar 24/7 as tolerated   CARDIOVASCULAR  A:  Propofol/pentobarb induced hypotension - resolved H/O CHF - compensated Chronic hypertension P:  - Monitor.  RENAL A:   Acute on CRI - nonoliguric Hypernatremia P:   - Monitor BMET. - Correct electrolytes and free water deficit as needed.  GASTROINTESTINAL A:  No issues P:   - Gastric tube to be  placed today 8/1  HEMATOLOGIC A:  Mild anemia. P:  -Monitor CBC intermittently  - On LMWH for DVT prophy - Hold heparin today for gastric tube placement  INFECTIOUS A:  No overt infections P:   - Micro and abx as above - Trend fever curve and CBC   ENDOCRINE  A:  DM2. Hyperglycemia. Diabetic Neuropathy - very severe, to point she had stepped on a tack and walked with it in her foot for 3 days before she realized it was there P:   - Cont lantus, SSI. - Patient continues to have hyperglycemia. Diabetes coordinator suggests adding Novolog 3 units q4 hours once feedings resume.   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 - Aggressive rehab. - PT/OT to be reordered after patient becomes more interactive per request of PT. - Neuro is following and suggests prognosis remains poor.    7/26:  Family agrees DNR in the event of cardiac arrest.  7/27:  approx 1 hour spent with family regarding natural progression of current brain injury / end of life care, long term vent / trach support etc.   7/29: Meeting with Case Manager and family to discuss future plans regarding Janina Mayo, after an extensive discussion, husband finally made a decision that he would like for the patient to be trached but remains unsure about a feeding tube.  Despite my best efforts to convince him of need for PEG he remains unsure about it.  Will trach on 7/30 at 10 AM.  7/30: Trach done by Dr. Molli Knock 7/31: Family aggrees with placement of gastric tube by IR 8/1: Gastric tube to be placed today  Jennifer Little PA-S  CC time 35 min.  Patient seen and examined, agree with above note.  I dictated the care and orders written for this patient under my direction.  Alyson Reedy, MD [phone]

## 2013-05-27 NOTE — ED Notes (Signed)
Glucagon and Fentanyl drawn up by different nurses at same time; Fentanyl had not been labeled yet- so unsure which vial was which? Wasted entire Fentanyl and drew out new vial. New vial of Glucagon drawn out as well. Peggy, RN witnessed event with Justice Rocher and Rodney Booze.

## 2013-05-27 NOTE — Progress Notes (Signed)
Peripherally Inserted Central Catheter/Midline Placement  The IV Nurse has discussed with the patient and/or persons authorized to consent for the patient, the purpose of this procedure and the potential benefits and risks involved with this procedure.  The benefits include less needle sticks, lab draws from the catheter and patient may be discharged home with the catheter.  Risks include, but not limited to, infection, bleeding, blood clot (thrombus formation), and puncture of an artery; nerve damage and irregular heat beat.  Alternatives to this procedure were also discussed.  PICC/Midline Placement Documentation        Brenda Pope 05/27/2013, 11:28 AM

## 2013-05-27 NOTE — Procedures (Signed)
Successful fluoroscopic guided insertion of gastrostomy tube without immediate post procedural complicatoin.   The gastrostomy tube may be used immediately for medications.  Otherwise, place gastrostomy tube to low wall suction for 24 hrs.  Tube feeds may be initiated in 24 hours as per the primary team.   

## 2013-05-28 ENCOUNTER — Inpatient Hospital Stay (HOSPITAL_COMMUNITY): Payer: Medicaid Other

## 2013-05-28 LAB — BASIC METABOLIC PANEL
BUN: 25 mg/dL — ABNORMAL HIGH (ref 6–23)
Chloride: 110 mEq/L (ref 96–112)
Creatinine, Ser: 0.46 mg/dL — ABNORMAL LOW (ref 0.50–1.10)
GFR calc Af Amer: >90 mL/min (ref >90)
GFR calc non Af Amer: >90 mL/min (ref >90)
Glucose, Bld: 141 mg/dL — ABNORMAL HIGH (ref 70–99)

## 2013-05-28 LAB — GLUCOSE, CAPILLARY
Glucose-Capillary: 133 mg/dL — ABNORMAL HIGH (ref 70–99)
Glucose-Capillary: 135 mg/dL — ABNORMAL HIGH (ref 70–99)
Glucose-Capillary: 146 mg/dL — ABNORMAL HIGH (ref 70–99)
Glucose-Capillary: 156 mg/dL — ABNORMAL HIGH (ref 70–99)

## 2013-05-28 LAB — CBC
HCT: 25.7 % — ABNORMAL LOW (ref 36.0–46.0)
Hemoglobin: 8.2 g/dL — ABNORMAL LOW (ref 12.0–15.0)
MCHC: 31.9 g/dL (ref 30.0–36.0)
MCV: 90.8 fL (ref 78.0–100.0)
RDW: 12.9 % (ref 11.5–15.5)

## 2013-05-28 MED ORDER — HYDRALAZINE HCL 20 MG/ML IJ SOLN
10.0000 mg | Freq: Four times a day (QID) | INTRAMUSCULAR | Status: DC | PRN
  Administered 2013-05-28 – 2013-05-30 (×2): 10 mg via INTRAVENOUS
  Filled 2013-05-28 (×2): qty 1

## 2013-05-28 MED ORDER — METOPROLOL TARTRATE 25 MG/10 ML ORAL SUSPENSION
25.0000 mg | Freq: Two times a day (BID) | ORAL | Status: DC
Start: 2013-05-28 — End: 2013-06-11
  Administered 2013-05-28 – 2013-06-11 (×28): 25 mg
  Filled 2013-05-28 (×31): qty 10

## 2013-05-28 NOTE — Progress Notes (Signed)
Subjective: Patient did not respond when I was in the room but when the husband entered she opened her eyes to his voice and nodded her head to his questions.  He reports he has seen the RUE and RLE move.  Objective: Current vital signs: BP 171/74  Pulse 79  Temp(Src) 98.1 F (36.7 C) (Oral)  Resp 14  Ht 6' (1.829 m)  Wt 125.9 kg (277 lb 9 oz)  BMI 37.64 kg/m2  SpO2 100% Vital signs in last 24 hours: Temp:  [96.9 F (36.1 C)-98.6 F (37 C)] 98.1 F (36.7 C) (08/02 0800) Pulse Rate:  [70-87] 79 (08/02 0800) Resp:  [5-24] 14 (08/02 0800) BP: (154-189)/(58-157) 171/74 mmHg (08/02 0800) SpO2:  [96 %-100 %] 100 % (08/02 0800) FiO2 (%):  [28 %-40 %] 28 % (08/02 0800)  Intake/Output from previous day: 08/01 0701 - 08/02 0700 In: 1110 [I.V.:460; NG/GT:250] Out: 610 [Urine:610] Intake/Output this shift:   Nutritional status: NPO  Neurologic Exam: Mental Status: Patient awake.  With stimulation moves the RUE minimally.  No other movement seen.   Cranial Nerves: II: Discs flat bilaterally; Blinks to confrontation, pupils equal, round, reactive to light and accommodation III,IV, VI: dolls response present bilaterally V,VII: decreased corneal on the left VIII: hearing normal bilaterally IX,X: gag reflex not tested XI: bilateral shoulder shrug not tested XII: tongue extension not tested Motor: 1/5 movement noted in the RUE.  No other movement noted.   Sensory: Not responding to stimuli in the extremities  Deep Tendon Reflexes: Absent throughout Plantars: Mute bilaterally  Lab Results: Basic Metabolic Panel:  Recent Labs Lab 05/24/13 0335 05/25/13 0540 05/26/13 0425 05/27/13 0450 05/28/13 0415  NA 147* 146* 146* 149* 146*  K 3.6 3.6 3.6 3.6 3.9  CL 112 110 110 113* 110  CO2 30 30 31 31 30   GLUCOSE 228* 184* 218* 154* 141*  BUN 43* 45* 39* 34* 25*  CREATININE 0.62 0.59 0.57 0.54 0.46*  CALCIUM 8.6 8.7 8.6 8.6 8.7  MG 2.3 2.3 2.3 2.4  --   PHOS 3.1 3.2 3.5 2.9   --     Liver Function Tests:  Recent Labs Lab 05/28/13 0415  ALBUMIN 1.5*   No results found for this basename: LIPASE, AMYLASE,  in the last 168 hours No results found for this basename: AMMONIA,  in the last 168 hours  CBC:  Recent Labs Lab 05/24/13 0335 05/25/13 0540 05/26/13 0425 05/27/13 0450 05/28/13 0415  WBC 5.2 4.3 5.3 6.2 5.6  HGB 8.2* 7.9* 8.4* 7.9* 8.2*  HCT 25.2* 24.2* 25.4* 24.2* 25.7*  MCV 91.6 91.7 90.4 91.3 90.8  PLT 199 197 211 229 260    Cardiac Enzymes: No results found for this basename: CKTOTAL, CKMB, CKMBINDEX, TROPONINI,  in the last 168 hours  Lipid Panel: No results found for this basename: CHOL, TRIG, HDL, CHOLHDL, VLDL, LDLCALC,  in the last 168 hours  CBG:  Recent Labs Lab 05/27/13 1615 05/27/13 2024 05/28/13 05/28/13 0422 05/28/13 0807  GLUCAP 180* 138* 156* 133* 146*    Microbiology: Results for orders placed during the hospital encounter of 05/02/13  MRSA PCR SCREENING     Status: Abnormal   Collection Time    05/02/13 10:25 PM      Result Value Range Status   MRSA by PCR POSITIVE (*) NEGATIVE Final   Comment:            The GeneXpert MRSA Assay (FDA     approved for NASAL specimens  only), is one component of a     comprehensive MRSA colonization     surveillance program. It is not     intended to diagnose MRSA     infection nor to guide or     monitor treatment for     MRSA infections.     RESULT CALLED TO, READ BACK BY AND VERIFIED WITH:     GRAHAM,K RN 0015 05/03/13 MITCHELL,L  CSF CULTURE     Status: None   Collection Time    05/05/13 12:43 PM      Result Value Range Status   Specimen Description CSF   Final   Special Requests 1.0ML FLUID   Final   Gram Stain     Final   Value: CYTOSPIN SLIDE WBC PRESENT,BOTH PMN AND MONONUCLEAR     NO ORGANISMS SEEN   Culture NO GROWTH 3 DAYS   Final   Report Status 05/08/2013 FINAL   Final  GRAM STAIN     Status: None   Collection Time    05/05/13 12:43 PM       Result Value Range Status   Specimen Description CSF   Final   Special Requests 1.0ML FLUID   Final   Gram Stain     Final   Value: CYTOSPIN SLIDE:     WBC PRESENT,BOTH PMN AND MONONUCLEAR     NO ORGANISMS SEEN   Report Status 05/05/2013 FINAL   Final  CULTURE, BLOOD (ROUTINE X 2)     Status: None   Collection Time    05/05/13  2:50 PM      Result Value Range Status   Specimen Description BLOOD LEFT FINGER   Final   Special Requests BOTTLES DRAWN AEROBIC ONLY [facility]   Final   Culture  Setup Time 05/05/2013 21:09   Final   Culture NO GROWTH 5 DAYS   Final   Report Status 05/11/2013 FINAL   Final  CULTURE, BLOOD (ROUTINE X 2)     Status: None   Collection Time    05/05/13  3:03 PM      Result Value Range Status   Specimen Description BLOOD LEFT ARM   Final   Special Requests BOTTLES DRAWN AEROBIC AND ANAEROBIC 10CC   Final   Culture  Setup Time 05/05/2013 21:09   Final   Culture NO GROWTH 5 DAYS   Final   Report Status 05/11/2013 FINAL   Final    Coagulation Studies: No results found for this basename: LABPROT, INR,  in the last 72 hours  Imaging: Ir Gastrostomy Tube Mod Sed  05/27/2013   *RADIOLOGY REPORT*  Indication:  Dysphagia  PULL TROUGH GASTOSTOMY TUBE PLACEMENT  Comparison: Abdominal radiograph - earlier same day  Medications:  Versed 2 mg IV; Fentanyl 25 mcg IV; the patient is currently admitted to the hospital receiving intravenous antibiotics; Antibiotics were administered within 1 hour of the procedure.  Contrast volume:  20 mL Omnipaque-300 administered into the gastric lumen  Sedation time: 35 minutes  Fluoroscopy time: 3 minutes, 36 seconds  Complications: None immediate  PROCEDURE/FINDINGS:  Informed written consent was obtained from the patient's family following explanation of the procedure, risks, benefits and alternatives.  A time out was performed prior to the initiation of the procedure.  Maximal barrier sterile technique utilized including caps, mask, sterile gowns,  sterile gloves, large sterile drape, hand hygiene and Betadine prep.  The left upper quadrant was sterilely prepped and draped.  An oral gastric catheter was inserted  into the stomach under fluoroscopy. The existing nasogastric feeding tube was removed.  The left costal margin and barium opacified transverse colon were identified and avoided.  Air was injected into the stomach for insufflation and visualization under fluoroscopy.  Under sterile conditions a 17 gauge trocar needle was utilized to access the stomach percutaneously beneath the left subcostal margin after the overlying soft tissues were anesthetized with 1% Lidocaine with epinephrine.  Needle position was confirmed within the stomach with aspiration of air and injection of small amount of contrast.   A single T tack was deployed for gastropexy.  Over an Amplatz guide wire, a 9-French sheath was inserted into the stomach.  A snare device was utilized to capture the oral gastric catheter.  The snare device was pulled retrograde from the stomach up the esophagus and out the oropharynx.  The 20-French pull-through gastrostomy was connected to the snare device and pulled antegrade through the oropharynx down the esophagus into the stomach and then through the percutaneous tract external to the patient.  The gastrostomy was assembled externally.  Contrast injection confirms position in the stomach.   Several spot radiographic images were obtained in various obliquities for documentation.  The patient tolerated procedure well without immediate post procedural complication.  IMPRESSION:  Successful fluoroscopic insertion of a 20-French "pull-through" gastrostomy.   Original Report Authenticated By: Tacey Ruiz, MD   Dg Chest Port 1 View  05/28/2013   *RADIOLOGY REPORT*  Clinical Data: Respiratory failure.  PORTABLE CHEST - 1 VIEW  Comparison: 05/27/2013  Findings: Tracheostomy tube and right PICC line are in place, unchanged.  Mild cardiomegaly.  Minimal  bibasilar atelectasis.  No significant effusion.  No pneumothorax.  IMPRESSION: Bibasilar atelectasis.   Original Report Authenticated By: Charlett Nose, M.D.   Dg Chest Port 1 View  05/27/2013   *RADIOLOGY REPORT*  Clinical Data: Line placement  PORTABLE CHEST - 1 VIEW  Comparison: 05/26/2013  Findings: Right-sided central venous catheter tip is difficult to see but appears be within the SVC.  This overlies the feeding tube.  Tracheostomy in good position.  Left lower lobe atelectasis, increased in the interval.  IMPRESSION: PICC tip difficult to see but appears to enter the SVC.  Increased left lower lobe atelectasis.   Original Report Authenticated By: Janeece Riggers, M.D.   Dg Abd Portable 1v  05/27/2013   *RADIOLOGY REPORT*  Clinical Data: Evaluate barium.  PORTABLE ABDOMEN - 1 VIEW  Comparison: 05/20/2013.  Findings: Weighted feeding tube is present with the tip in the antrum of the stomach.  Barium outlines the colon.  Small amount of residual barium is present within the small bowel.  Bowel gas pattern is nonobstructive. The nasogastric tube appears to have been removed.  IMPRESSION: Barium outlines the colon.  Feeding tube tip in the antrum of the stomach.   Original Report Authenticated By: Andreas Newport, M.D.    Medications:  I have reviewed the patient's current medications. Scheduled: . antiseptic oral rinse  1 application Mouth Rinse QID  . aspirin  325 mg Per Tube Daily  . chlorhexidine  15 mL Mouth/Throat BID  . enoxaparin (LOVENOX) injection  40 mg Subcutaneous Q24H  . feeding supplement  30 mL Per Tube 5 X Daily  . free water  200 mL Per Tube Q6H  . insulin aspart  0-20 Units Subcutaneous Q4H  . insulin aspart  3 Units Subcutaneous TID WC  . insulin glargine  40 Units Subcutaneous QHS  . lacosamide  200 mg Per  Tube BID  . levETIRAcetam  1,000 mg Per Tube BID  . pantoprazole sodium  40 mg Per Tube Daily  . phenytoin  100 mg Per Tube TID  . sodium chloride  10-40 mL Intracatheter  Q12H  . 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  Now off all sedation.  Showing improvement, particularly when the husband is in the room.    Recommendations: 1.  Therapy 2.  Will continue to follow with you.      LOS: 26 days   Thana Farr, MD [facility] [phone] 05/28/2013  9:07 AM

## 2013-05-28 NOTE — Progress Notes (Signed)
Subjective: Pt without acute changes; husband in room  Objective: Vital signs in last 24 hours: Temp:  [96.9 F (36.1 C)-98.6 F (37 C)] 98.1 F (36.7 C) (08/02 0800) Pulse Rate:  [70-87] 79 (08/02 0800) Resp:  [5-24] 14 (08/02 0800) BP: (154-189)/(58-157) 171/74 mmHg (08/02 0800) SpO2:  [96 %-100 %] 100 % (08/02 0800) FiO2 (%):  [28 %-40 %] 28 % (08/02 0800) Last BM Date: 05/28/13  Intake/Output from previous day: 08/01 0701 - 08/02 0700 In: 1110 [I.V.:460; NG/GT:250] Out: 610 [Urine:610] Intake/Output this shift:    G tube intact, small amt old blood under disc; no active bleeding; abd soft,ND  Lab Results:   Recent Labs  05/27/13 0450 05/28/13 0415  WBC 6.2 5.6  HGB 7.9* 8.2*  HCT 24.2* 25.7*  PLT 229 260   BMET  Recent Labs  05/27/13 0450 05/28/13 0415  NA 149* 146*  K 3.6 3.9  CL 113* 110  CO2 31 30  GLUCOSE 154* 141*  BUN 34* 25*  CREATININE 0.54 0.46*  CALCIUM 8.6 8.7   PT/INR No results found for this basename: LABPROT, INR,  in the last 72 hours ABG  Recent Labs  05/26/13 0406  PHART 7.439  HCO3 29.3*    Studies/Results: Ir Gastrostomy Tube Mod Sed  05/27/2013   *RADIOLOGY REPORT*  Indication:  Dysphagia  PULL TROUGH GASTOSTOMY TUBE PLACEMENT  Comparison: Abdominal radiograph - earlier same day  Medications:  Versed 2 mg IV; Fentanyl 25 mcg IV; the patient is currently admitted to the hospital receiving intravenous antibiotics; Antibiotics were administered within 1 hour of the procedure.  Contrast volume:  20 mL Omnipaque-300 administered into the gastric lumen  Sedation time: 35 minutes  Fluoroscopy time: 3 minutes, 36 seconds  Complications: None immediate  PROCEDURE/FINDINGS:  Informed written consent was obtained from the patient's family following explanation of the procedure, risks, benefits and alternatives.  A time out was performed prior to the initiation of the procedure.  Maximal barrier sterile technique utilized including caps,  mask, sterile gowns, sterile gloves, large sterile drape, hand hygiene and Betadine prep.  The left upper quadrant was sterilely prepped and draped.  An oral gastric catheter was inserted into the stomach under fluoroscopy. The existing nasogastric feeding tube was removed.  The left costal margin and barium opacified transverse colon were identified and avoided.  Air was injected into the stomach for insufflation and visualization under fluoroscopy.  Under sterile conditions a 17 gauge trocar needle was utilized to access the stomach percutaneously beneath the left subcostal margin after the overlying soft tissues were anesthetized with 1% Lidocaine with epinephrine.  Needle position was confirmed within the stomach with aspiration of air and injection of small amount of contrast.   A single T tack was deployed for gastropexy.  Over an Amplatz guide wire, a 9-French sheath was inserted into the stomach.  A snare device was utilized to capture the oral gastric catheter.  The snare device was pulled retrograde from the stomach up the esophagus and out the oropharynx.  The 20-French pull-through gastrostomy was connected to the snare device and pulled antegrade through the oropharynx down the esophagus into the stomach and then through the percutaneous tract external to the patient.  The gastrostomy was assembled externally.  Contrast injection confirms position in the stomach.   Several spot radiographic images were obtained in various obliquities for documentation.  The patient tolerated procedure well without immediate post procedural complication.  IMPRESSION:  Successful fluoroscopic insertion of a 20-French "  pull-through" gastrostomy.   Original Report Authenticated By: Tacey Ruiz, MD   Dg Chest Port 1 View  05/28/2013   *RADIOLOGY REPORT*  Clinical Data: Respiratory failure.  PORTABLE CHEST - 1 VIEW  Comparison: 05/27/2013  Findings: Tracheostomy tube and right PICC line are in place, unchanged.  Mild  cardiomegaly.  Minimal bibasilar atelectasis.  No significant effusion.  No pneumothorax.  IMPRESSION: Bibasilar atelectasis.   Original Report Authenticated By: Charlett Nose, M.D.   Dg Chest Port 1 View  05/27/2013   *RADIOLOGY REPORT*  Clinical Data: Line placement  PORTABLE CHEST - 1 VIEW  Comparison: 05/26/2013  Findings: Right-sided central venous catheter tip is difficult to see but appears be within the SVC.  This overlies the feeding tube.  Tracheostomy in good position.  Left lower lobe atelectasis, increased in the interval.  IMPRESSION: PICC tip difficult to see but appears to enter the SVC.  Increased left lower lobe atelectasis.   Original Report Authenticated By: Janeece Riggers, M.D.   Dg Abd Portable 1v  05/27/2013   *RADIOLOGY REPORT*  Clinical Data: Evaluate barium.  PORTABLE ABDOMEN - 1 VIEW  Comparison: 05/20/2013.  Findings: Weighted feeding tube is present with the tip in the antrum of the stomach.  Barium outlines the colon.  Small amount of residual barium is present within the small bowel.  Bowel gas pattern is nonobstructive. The nasogastric tube appears to have been removed.  IMPRESSION: Barium outlines the colon.  Feeding tube tip in the antrum of the stomach.   Original Report Authenticated By: Andreas Newport, M.D.    Anti-infectives: Anti-infectives   Start     Dose/Rate Route Frequency Ordered Stop   05/27/13 0000  vancomycin (VANCOCIN) IVPB 1000 mg/200 mL premix    Comments:  On call to IR 8/1 for g-tube placement   1,000 mg 200 mL/hr over 60 Minutes Intravenous  Once 05/26/13 1334 05/27/13 1125      Assessment/Plan: S/p perc G tube 8/1; ok to use tube; other plans as per neurology/CCM  LOS: 26 days    ALLRED,D [facility] 05/28/2013

## 2013-05-28 NOTE — Progress Notes (Signed)
PULMONARY  / CRITICAL CARE MEDICINE  Name: Brenda Pope MRN: [id number] DOB: 1956-10-13    ADMISSION DATE:  05/02/2013 CONSULTATION DATE:  7/7/104  REFERRING MD :  Transfer from [facility] PRIMARY SERVICE:  PCCM  CHIEF COMPLAINT:  Status epilepticus  BRIEF PATIENT DESCRIPTION: 57 y/o with past medical history of CVAs/TIAs and diastolic heart failure, severe baseline hypertension with noncompliance, poorly controlled diabetes admitted to [facility] on 7/5 with acute dyspnea and suspected CHF exacerbation.  On the day of admission developed developed neurological findings consistent with acute CVA. 7/6 intubated for airway protection during suspected seizure activity.  Hypotensive post intubation requiring vasopressors. MRI confirmed small acute infarcts of the right parietal lobe. On 7/7 transferred to [facility] for further management.  SIGNIFICANT EVENTS / STUDIES:  7/05 -  Admitted to [place] with CHF exacerbation; stroke symptoms 7/06 - Suspected seizure activity, intubated for airway protection; hypotensive post intubation requiring vasopressors 7/06 -  Brain MRI:  Multiple small acute infarcts R parietal lobe, possible small infarcts left basal ganglia and central pons 7/06 - TTE:  No source of CVA, EF 55-60% 7/06 - Carotid Doppler:  No evidence of hemodynamically significant stenosis 7/07 - Transferred to [facility], PCCM serivce 7/07 - Neuro consult: "Concern patient is on SE in the context of recent cortical stroke." 7/08 - MRI brain: Confluent and scattered right MCA and right PCA acute infarcts, plus scattered small left hemisphere and occasional posterior fossa acute infarcts. The vast majority of these infarcts are newly seen since 05/01/2013.  This appearance suggests embolic phenomena which severely affected more proximal/medium-sized vessels of the right MCA and PCA territories. Cytotoxic edema without significant mass effect at this time. No associated hemorrhage 7/08 -  EEG: severe diffuse encephalopathic process with asymmetrical involvement. Findings reported from the left hemisphere were nonspecific. Findings record from the right hemisphere were consistent with acute large area of infarction. No frank epileptiform activity was recorded. 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  7/10 - LP performed by Neuro:  7/10 - Burst suppression with propofol. Norepi initiated to prevent propofol induced hypotension. Consideration of primary CNS vasculitis. Serologies ordered>> CRP, ESR markedly elevated. High dose methylpred ordered. Insulin gtt ordered while on high dose steriods 7/10 - propofol ineffective at burst suppression. pentobarbitol initiated 7/11 - CT head: Right frontal lobe, right parietal lobe, right occipital lobe and posterior right temporal lobe acute/subacute infarcts. Surrounding edema with local mass effect. Minimal petechial hemorrhage suspected particularly along the superior margin of the infarct. No gross hemorrhage noted. No midline shift 7/11 - angiogram eval for vasculitis: smooth focal areas of narrowing of prox PCAs and distal pericallosal. Subcortical branches, suspicious of vasculitis. 7/14 - TEE: no evidence of thrombus 7/20 - MRI brain: Overall mildly regressed signal abnormality in the brain associated with multi focal infarcts. The most confluent infarcts in the right posterior MCA and PCA territories do demonstrate persistent restricted diffusion, but less cytotoxic edema. There is trace petechial hemorrhage, but no malignant hemorrhagic transformation or associated mass effect. Slight extension of a central pontine lacunar infarct (pontine perforator artery territory) without mass effect or hemorrhage 7/21 - EEG: severe encephalopathy, nonspecific as to cause. No electrography seizures  noted.  7/22 - Family indicates that they do not wish to pursue trach/G-tube and SNF. However, they are not ready to "give up" yet citing their concern  that all of the meds used to induced coma might not have fully washed out of her system yet. I have noted that she developed severe hypernatremia and correcting this might improve her MS. We will continue our support for a couple more days. She will be DNR in event of cardiac arrest 7/24 - Family plans on one way extubation when daughter arrives back in town 7/25 or 7/26 7/27 - persistent request from family for one more EEG to ensure slowing before removal of ETT / comfort care 7/28 - In light of pt following commands this AM, family is now considering Trach and rehab for patient. Husband to decide in the coming days.  7/28- Case manager explained that LTAC is not an option for this patient because she is uninsured.  7/29- Family has decided to do trach. Trach scheduled for 10am 7/29. Husband remains unsure about PEG tube.  7/30- Trach completed by Dr. Molli Knock 7/31- Family ok with placement of Gastric tube per IR  8/1- Gastric tube to be placed today  LINES / TUBES: L radial A-line 7/10 >> 7/12 R IJ CVL 7/6 >> 7/21 ETT 7/6 >> 7/30 Trach (JY) 7/30  >>>   PEG 8/1 >>  CULTURES: MRSA PCR 7/07 >> POS Blood culture >>ng CSF 7/10 >> ng  ANTIBIOTICS: 8/1 Vanc x 1 dose for PEG tube placement  SUBJ: afebrile. Patient tolerated TC through the night and this AM.   VITAL SIGNS: Temp:  [96.9 F (36.1 C)-98.6 F (37 C)] 98.3 F (36.8 C) (08/02 0425) Pulse Rate:  [70-87] 72 (08/02 0500) Resp:  [5-24] 13 (08/02 0500) BP: (158-189)/(58-157) 163/62 mmHg (08/02 0500) SpO2:  [96 %-100 %] 100 % (08/02 0500) FiO2 (%):  [28 %-40 %] 28 % (08/02 0500)  HEMODYNAMICS:   VENTILATOR SETTINGS: Vent Mode:  [-]  FiO2 (%):  [28 %-40 %] 28 %  INTAKE / OUTPUT: Intake/Output     08/01 0701 - 08/02 0700 08/02 0701 - 08/03 0700   I.V. (mL/kg) 420 (3.3)     Other 400    NG/GT 250    Total Intake(mL/kg) 1070 (8.5)    Urine (mL/kg/hr) 610 (0.2)    Total Output 610     Net +460          Urine Occurrence 3 x    Stool Occurrence 2 x      PHYSICAL EXAMINATION: General: chronically ill trach in place, Neuro: opens eyes to voice, follows simple commands- will stick out her tongue and squeeze  hand  HEENT: PERRL Cardiovascular:  RRR s M Lungs: resps even, non labored on vent, few scattered rhonchi  Abdomen:  Soft, nontender, bowel sounds diminished Musculoskeletal: warm, pale, Trace edema bilateral lower extremity and 1+ bilateral upper extremity  LABS: BMET  Recent Labs Lab 05/24/13 0335 05/25/13 0540 05/26/13 0425 05/27/13 0450 05/28/13 0415  NA 147* 146* 146* 149* 146*  K 3.6 3.6 3.6 3.6 3.9  CL 112 110 110 113* 110  CO2 30 30 31 31 30   GLUCOSE 228* 184* 218* 154* 141*  BUN 43* 45* 39* 34* 25*  CREATININE 0.62 0.59 0.57 0.54 0.46*  CALCIUM 8.6 8.7 8.6 8.6 8.7  MG 2.3 2.3 2.3 2.4  --   PHOS 3.1 3.2 3.5 2.9  --    CBC  Recent Labs Lab 05/26/13 0425 05/27/13 0450 05/28/13 0415  HGB 8.4* 7.9* 8.2*  HCT 25.4* 24.2* 25.7*  WBC 5.3 6.2 5.6  PLT 211 229 260   CXR:  Stable with no new findings  ASSESSMENT / PLAN:  PULMONARY A:   Acute respiratory failure due to AMS - Trach placed 7/30 P:   - Plan for trach collar 24/7 as tolerated   CARDIOVASCULAR  A:  Propofol/pentobarb induced hypotension - resolved H/O CHF - compensated Chronic hypertension P:  - Monitor.  RENAL A:   Acute on CRI - nonoliguric Hypernatremia P:   - Monitor BMET. - Correct electrolytes and free water deficit as needed.  GASTROINTESTINAL A:  PEG 8/1 P:   - resume TFs  HEMATOLOGIC A:  Mild anemia. P:  -Monitor CBC intermittently  - On LMWH for DVT prophy   INFECTIOUS A:  No overt infections P:   - Micro and abx as above   ENDOCRINE  A:  DM2. Hyperglycemia. Diabetic Neuropathy - very severe, to point she had stepped on a  tack and walked with it in her foot for 3 days before she realized it was there P:   - Cont lantus, SSI. -If hyperglycemia -suggests adding Novolog 3 units q4 hours once feedings resume.   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 - Aggressive rehab. - PT/OT to be reordered after patient becomes more interactive per request of PT. - Neuro is following and suggests prognosis for full recovery remains poor.    GLOBAL -  Family agrees DNR in the event of cardiac arrest.   OK to transfer to SDU, SNF eventually  Oretha Milch, MD

## 2013-05-28 NOTE — Progress Notes (Signed)
eLink Physician-Brief Progress Note Patient Name: Brenda Pope DOB: [birth year] MRN: [id number]  Date of Service  05/28/2013   HPI/Events of Note   HTN  eICU Interventions   Metoprolol restarted 25 bid Hydralazine PRN    Intervention Category Major Interventions: Hypertension - evaluation and management  Melchor Kirchgessner 05/28/2013, 9:11 PM

## 2013-05-29 DIAGNOSIS — Z930 Tracheostomy status: Secondary | ICD-10-CM

## 2013-05-29 LAB — GLUCOSE, CAPILLARY
Glucose-Capillary: 187 mg/dL — ABNORMAL HIGH (ref 70–99)
Glucose-Capillary: 173 mg/dL — ABNORMAL HIGH (ref 70–99)
Glucose-Capillary: 172 mg/dL — ABNORMAL HIGH (ref 70–99)
Glucose-Capillary: 204 mg/dL — ABNORMAL HIGH (ref 70–99)

## 2013-05-29 NOTE — Progress Notes (Signed)
PULMONARY  / CRITICAL CARE MEDICINE  Name: Brenda Pope MRN: [id number] DOB: 1956/08/14    ADMISSION DATE:  05/02/2013 CONSULTATION DATE:  7/7/104  REFERRING MD :  Transfer from [facility] PRIMARY SERVICE:  PCCM  CHIEF COMPLAINT:  Status epilepticus  BRIEF PATIENT DESCRIPTION: 57 y/o with past medical history of CVAs/TIAs and diastolic heart failure, severe baseline hypertension with noncompliance, poorly controlled diabetes admitted to [facility] on 7/5 with acute dyspnea and suspected CHF exacerbation.  On the day of admission developed developed neurological findings consistent with acute CVA. 7/6 intubated for airway protection during suspected seizure activity.  Hypotensive post intubation requiring vasopressors. MRI confirmed small acute infarcts of the right parietal lobe. On 7/7 transferred to [facility] for further management.  SIGNIFICANT EVENTS / STUDIES:  7/05 -  Admitted to [place] [facility] with CHF exacerbation; stroke symptoms 7/06 - Suspected seizure activity, intubated for airway protection; hypotensive post intubation requiring vasopressors 7/06 -  Brain MRI:  Multiple small acute infarcts R parietal lobe, possible small infarcts left basal ganglia and central pons 7/06 - TTE:  No source of CVA, EF 55-60% 7/06 - Carotid Doppler:  No evidence of hemodynamically significant stenosis 7/07 - Transferred to [facility], PCCM serivce 7/07 - Neuro consult: "Concern patient is on SE in the context of recent cortical stroke." 7/08 - MRI brain: Confluent and scattered right MCA and right PCA acute infarcts, plus scattered small left hemisphere and occasional posterior fossa acute infarcts. The vast majority of these infarcts are newly seen since 05/01/2013.  This appearance suggests embolic phenomena which severely affected more proximal/medium-sized vessels of the right MCA and PCA territories. Cytotoxic edema without significant mass effect at this time. No associated hemorrhage 7/08 -  EEG: severe diffuse encephalopathic process with asymmetrical involvement. Findings reported from the left hemisphere were nonspecific. Findings record from the right hemisphere were consistent with acute large area of infarction. No frank epileptiform activity was recorded. 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  7/10 - LP performed by Neuro:  7/10 - Burst suppression with propofol. Norepi initiated to prevent propofol induced hypotension. Consideration of primary CNS vasculitis. Serologies ordered>> CRP, ESR markedly elevated. High dose methylpred ordered. Insulin gtt ordered while on high dose steriods 7/10 - propofol ineffective at burst suppression. pentobarbitol initiated 7/11 - CT head: Right frontal lobe, right parietal lobe, right occipital lobe and posterior right temporal lobe acute/subacute infarcts. Surrounding edema with local mass effect. Minimal petechial hemorrhage suspected particularly along the superior margin of the infarct. No gross hemorrhage noted. No midline shift 7/11 - angiogram eval for vasculitis: smooth focal areas of narrowing of prox PCAs and distal pericallosal. Subcortical branches, suspicious of vasculitis. 7/14 - TEE: no evidence of thrombus 7/20 - MRI brain: Overall mildly regressed signal abnormality in the brain associated with multi focal infarcts. The most confluent infarcts in the right posterior MCA and PCA territories do demonstrate persistent restricted diffusion, but less cytotoxic edema. There is trace petechial hemorrhage, but no malignant hemorrhagic transformation or associated mass effect. Slight extension of a central pontine lacunar infarct (pontine perforator artery territory) without mass effect or hemorrhage 7/21 - EEG: severe encephalopathy, nonspecific as to cause. No electrography seizures  noted.  7/28- Case manager explained that LTAC is not an option for this patient because she is uninsured.  7/30- Trach completed by Dr. Molli Knock 7/31- Family  ok with placement of Gastric tube per IR  8/1- Gastric tube to be placed today  LINES / TUBES: L radial A-line 7/10 >> 7/12 R IJ CVL 7/6 >> 7/21 ETT 7/6 >> 7/30 Trach (JY) 7/30  >>>   PEG 8/1 >>  CULTURES: MRSA PCR 7/07 >> POS Blood culture >>ng CSF 7/10 >> ng  ANTIBIOTICS:   SUBJ: afebrile. Patient tolerated TC    VITAL SIGNS: Temp:  [97.6 F (36.4 C)-99 F (37.2 C)] 98.4 F (36.9 C) (08/03 0419) Pulse Rate:  [67-91] 67 (08/03 0700) Resp:  [13-21] 14 (08/03 0700) BP: (134-181)/(46-69) 134/46 mmHg (08/03 0700) SpO2:  [99 %-100 %] 100 % (08/03 0818) FiO2 (%):  [28 %] 28 % (08/03 0818) Weight:  [122.6 kg (270 lb 4.5 oz)] 122.6 kg (270 lb 4.5 oz) (08/03 0400)  HEMODYNAMICS:   VENTILATOR SETTINGS: Vent Mode:  [-]  FiO2 (%):  [28 %] 28 %  INTAKE / OUTPUT: Intake/Output     08/02 0701 - 08/03 0700 08/03 0701 - 08/04 0700   I.V. (mL/kg) 480 (3.9)    Other     NG/GT 1694    Total Intake(mL/kg) 2174 (17.7)    Urine (mL/kg/hr)     Total Output       Net +2174          Urine Occurrence 4 x    Stool Occurrence 3 x      PHYSICAL EXAMINATION: General: chronically ill trach in place, Neuro: opens eyes to voice, follows simple commands- will stick out her tongue and flicker thumb  HEENT: PERRL Cardiovascular:  RRR s M Lungs: resps even, non labored on vent, few scattered rhonchi  Abdomen:  Soft, nontender, bowel sounds diminished Musculoskeletal: warm, pale, Trace edema bilateral lower extremity and 1+ bilateral upper extremity  LABS: BMET  Recent Labs Lab 05/24/13 0335 05/25/13 0540 05/26/13 0425 05/27/13 0450 05/28/13 0415  NA 147* 146* 146* 149* 146*  K 3.6 3.6 3.6 3.6 3.9  CL 112 110 110 113* 110  CO2 30 30 31 31 30   GLUCOSE 228* 184* 218* 154* 141*  BUN 43* 45* 39* 34* 25*  CREATININE 0.62  0.59 0.57 0.54 0.46*  CALCIUM 8.6 8.7 8.6 8.6 8.7  MG 2.3 2.3 2.3 2.4  --   PHOS 3.1 3.2 3.5 2.9  --    CBC  Recent Labs Lab 05/26/13 0425 05/27/13 0450 05/28/13 0415  HGB 8.4* 7.9* 8.2*  HCT 25.4* 24.2* 25.7*  WBC 5.3 6.2 5.6  PLT 211 229 260   CXR:  Stable with no new findings  ASSESSMENT / PLAN:  PULMONARY A:   Acute respiratory failure due to AMS - Trach placed 7/30 P:   - trach collar 24/7 as tolerated , sutures out at 7ds  CARDIOVASCULAR  A:  Propofol/pentobarb induced hypotension - resolved H/O CHF - compensated Chronic hypertension P:  -resume metoprolol -hydralazine prn  RENAL A:   Acute on CRI - nonoliguric Hypernatremia P:   - Monitor BMET intermittent. - Correct electrolytes and free water deficit as needed.  GASTROINTESTINAL A:  PEG 8/1 P:   -ct  TFs  HEMATOLOGIC A:  Mild anemia. P:  -Monitor CBC intermittently  - On LMWH for DVT prophy   INFECTIOUS A:  No overt infections P:   - Micro and abx as above   ENDOCRINE  A:  DM2. Hyperglycemia. Diabetic Neuropathy - very severe, to point she had stepped on a tack and walked with it in her foot  for 3 days before she realized it was there P:   - Cont lantus, SSI. -If hyperglycemia -suggests adding Novolog 3 units q4 hours once feedings resume.   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 - Aggressive rehab. - PT/OT to be reordered after patient becomes more interactive per request of PT. - Neuro is following and suggests prognosis for full recovery remains poor.    GLOBAL -  Family agrees DNR in the event of cardiac arrest.   OK to transfer to SDU, SNF eventually  Oretha Milch, MD

## 2013-05-30 LAB — GLUCOSE, CAPILLARY
Glucose-Capillary: 145 mg/dL — ABNORMAL HIGH (ref 70–99)
Glucose-Capillary: 186 mg/dL — ABNORMAL HIGH (ref 70–99)
Glucose-Capillary: 198 mg/dL — ABNORMAL HIGH (ref 70–99)

## 2013-05-30 MED ORDER — PHENYTOIN 125 MG/5ML PO SUSP
150.0000 mg | Freq: Three times a day (TID) | ORAL | Status: DC
  Administered 2013-05-30 – 2013-06-17 (×54): 150 mg
  Filled 2013-05-30 (×56): qty 8

## 2013-05-30 MED ORDER — GLUCERNA 1.2 CAL PO LIQD
1000.0000 mL | ORAL | Status: DC
  Administered 2013-05-30 – 2013-06-15 (×14): 1000 mL
  Filled 2013-05-30 (×37): qty 1000

## 2013-05-30 MED ORDER — PRO-STAT SUGAR FREE PO LIQD
30.0000 mL | Freq: Every day | ORAL | Status: DC
  Administered 2013-05-31 – 2013-06-15 (×16): 30 mL
  Filled 2013-05-30 (×16): qty 30

## 2013-05-30 NOTE — Clinical Social Work Note (Signed)
Clinical Social Work Department BRIEF PSYCHOSOCIAL ASSESSMENT 05/30/2013  Patient:  ZAYA, KESSENICH     Account Number:  [id number]     Admit date:  05/02/2013  Clinical Social Worker:  Verl Blalock  Date/Time:  05/30/2013 11:30 AM  Referred by:  Care Management  Date Referred:  05/30/2013 Referred for  SNF Placement   Other Referral:   Interview type:  Family Other interview type:   Patient husband in 3300 waiting area    PSYCHOSOCIAL DATA Living Status:  HUSBAND Admitted from facility:   Level of care:   Primary support name:  Acadia, Thammavong  [phone] Primary support relationship to patient:  SPOUSE Degree of support available:   Strong    CURRENT CONCERNS Current Concerns  Post-Acute Placement   Other Concerns:    SOCIAL WORK ASSESSMENT / PLAN Clinical Social Worker met with patient husband in 3300 waiting area to offer support and discuss patient needs at discharge.  Patient husband states that patient was living with him and has had strokes in the past.  Patient husband did express frustrations regarding conflicting stories of patient condition throughout whole hospitalization. Patient husband is very thankful that he did not withdraw ventilator support when the option was presented.  Patient husband expresses understanding of patient need for rehab/skilled nursing facility placement.  Patient husband would like to care for patient at home, but states that he is not currently comfortable with managing the tracheostomy.  Patient husband agreeable to initiating search in [place] and surrounding counties.  CSW to initiate search and follow up with patient husband regarding any possible bed offers.  Patient husband is aware that patient does have several barriers to choice facilities due to new tracheosotmy and lack of insurance coverage.  CSW remains available for support and to facilitate patient discharge needs once medically ready.   Assessment/plan status:   Psychosocial Support/Ongoing Assessment of Needs Other assessment/ plan:   Information/referral to community resources:   Visual merchandiser to provide facility list to patient husband - patient husband expressed interest in [facility]    PATIENT'S/FAMILY'S RESPONSE TO PLAN OF CARE: Patient is not currently oriented but alert and following commands.  Patient husband states that he feels patient will continue to improve.  Patient with large family support.  Patient husband agreeable with possible SNF placement but is also considering the idea of taking her home if he feels he is able to manage her at home.  CSW to update CM on patient husband plans.  CSW plans to follow up with patient husband to explore all options once beds become available.  Patient husband verbalized his appreciation for CSW support and involvement.

## 2013-05-30 NOTE — Progress Notes (Signed)
PULMONARY  / CRITICAL CARE MEDICINE  Name: Brenda Pope MRN: [id number] DOB: 12/11/1955    ADMISSION DATE:  05/02/2013 CONSULTATION DATE:  7/7/104  REFERRING MD :  Transfer from [facility] PRIMARY SERVICE:  PCCM  CHIEF COMPLAINT:  Status epilepticus  BRIEF PATIENT DESCRIPTION: 57 y/o with past medical history of CVAs/TIAs and diastolic heart failure, severe baseline hypertension with noncompliance, poorly controlled diabetes admitted to [facility] on 7/5 with acute dyspnea and suspected CHF exacerbation.  On the day of admission developed developed neurological findings consistent with acute CVA. 7/6 intubated for airway protection during suspected seizure activity.  Hypotensive post intubation requiring vasopressors. MRI confirmed small acute infarcts of the right parietal lobe. On 7/7 transferred to [facility] for further management.  SIGNIFICANT EVENTS / STUDIES:  7/05 -  Admitted to [facility] with CHF exacerbation; stroke symptoms 7/06 - Suspected seizure activity, intubated for airway protection; hypotensive post intubation requiring vasopressors 7/06 -  Brain MRI:  Multiple small acute infarcts R parietal lobe, possible small infarcts left basal ganglia and central pons 7/06 - TTE:  No source of CVA, EF 55-60% 7/06 - Carotid Doppler:  No evidence of hemodynamically significant stenosis 7/07 - Transferred to [facility], PCCM serivce 7/07 - Neuro consult: "Concern patient is on SE in the context of recent cortical stroke." 7/08 - MRI brain: Confluent and scattered right MCA and right PCA acute infarcts, plus scattered small left hemisphere and occasional posterior fossa acute infarcts. The vast majority of these infarcts are newly seen since 05/01/2013.  This appearance suggests embolic phenomena which severely affected more proximal/medium-sized vessels of the right MCA and PCA territories. Cytotoxic edema without significant mass effect at this time. No associated hemorrhage 7/08 -  EEG: severe diffuse encephalopathic process with asymmetrical involvement. Findings reported from the left hemisphere were nonspecific. Findings record from the right hemisphere were consistent with acute large area of infarction. No frank epileptiform activity was recorded. 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  7/10 - LP performed by Neuro:  7/10 - Burst suppression with propofol. Norepi initiated to prevent propofol induced hypotension. Consideration of primary CNS vasculitis. Serologies ordered>> CRP, ESR markedly elevated. High dose methylpred ordered. Insulin gtt ordered while on high dose steriods 7/10 - propofol ineffective at burst suppression. pentobarbitol initiated 7/11 - CT head: Right frontal lobe, right parietal lobe, right occipital lobe and posterior right temporal lobe acute/subacute infarcts. Surrounding edema with local mass effect. Minimal petechial hemorrhage suspected particularly along the superior margin of the infarct. No gross hemorrhage noted. No midline shift 7/11 - angiogram eval for vasculitis: smooth focal areas of narrowing of prox PCAs and distal pericallosal. Subcortical branches, suspicious of vasculitis. 7/14 - TEE: no evidence of thrombus 7/20 - MRI brain: Overall mildly regressed signal abnormality in the brain associated with multi focal infarcts. The most confluent infarcts in the right posterior MCA and PCA territories do demonstrate persistent restricted diffusion, but less cytotoxic edema. There is trace petechial hemorrhage, but no malignant hemorrhagic transformation or associated mass effect. Slight extension of a central pontine lacunar infarct (pontine perforator artery territory) without mass effect or hemorrhage 7/21 - EEG: severe encephalopathy, nonspecific as to cause. No electrography seizures  noted.  7/22 - Family indicates that they do not wish to pursue trach/G-tube and SNF. However, they are not ready to "give up" yet citing their concern  that all of the meds used to induced coma might not have fully washed out of her system yet. I have noted that she developed severe hypernatremia and correcting this might improve her MS. We will continue our support for a couple more days. She will be DNR in event of cardiac arrest 7/24 - Family plans on one way extubation when daughter arrives back in town 7/25 or 7/26 7/27 - persistent request from family for one more EEG to ensure slowing before removal of ETT / comfort care 7/28 - In light of pt following commands this AM, family is now considering Trach and rehab for patient. Husband to decide in the coming days.  7/28- Case manager explained that LTAC is not an option for this patient because she is uninsured.  7/29- Family has decided to do trach. Trach scheduled for 10am 7/29. Husband remains unsure about PEG tube.  7/30- Trach completed by Dr. Molli Knock 7/31- Family ok with placement of Gastric tube per IR 8/01- Gastric tube to be placed today  LINES / TUBES: L radial A-line 7/10 >> 7/12 R IJ CVL 7/6 >> 7/21 ETT 7/6 >> 7/30 Trach (JY) 7/30  >>  G tube (IR) 8/1 >>   CULTURES: MRSA PCR 7/07 >> POS Blood culture >>ng CSF 7/10 >> ng  ANTIBIOTICS:   SUBJ:  RASS -2. Intermittently F/C   VITAL SIGNS: Temp:  [98.1 F (36.7 C)-98.6 F (37 C)] 98.3 F (36.8 C) (08/04 1300) Pulse Rate:  [64-122] 73 (08/04 1141) Resp:  [7-24] 18 (08/04 1141) BP: (114-175)/(14-100) 117/47 mmHg (08/04 1100) SpO2:  [95 %-100 %] 99 % (08/04 1141) FiO2 (%):  [28 %] 28 % (08/04 1141) Weight:  [123 kg (271 lb 2.7 oz)] 123 kg (271 lb 2.7 oz) (08/04 0500)  HEMODYNAMICS:   VENTILATOR SETTINGS: Vent Mode:  [-]  FiO2 (%):  [28 %] 28 %  INTAKE / OUTPUT: Intake/Output     08/03 0701 - 08/04 0700 08/04 0701 - 08/05 0700   I.V. (mL/kg) 480 (3.9) 60 (0.5)    NG/GT 2290 180   Total Intake(mL/kg) 2770 (22.5) 240 (2)   Net +2770 +240        Urine Occurrence 7 x 2 x   Stool Occurrence 6 x      PHYSICAL EXAMINATION: General: RASS -2, NAD Neuro: intermittently F/C, diffusely weak HEENT: PERRL, EOMI Cardiovascular:  RRR s M Lungs: clear anteriorly Abdomen:  Soft, nontender, bowel sounds diminished Ext: warm, pale, Trace edema bilateral lower extremity and 1+ bilateral upper extremity  LABS: BMET  Recent Labs Lab 05/24/13 0335 05/25/13 0540 05/26/13 0425 05/27/13 0450 05/28/13 0415  NA 147* 146* 146* 149* 146*  K 3.6 3.6 3.6 3.6 3.9  CL 112 110 110 113* 110  CO2 30 30 31 31 30   GLUCOSE 228* 184* 218* 154* 141*  BUN 43* 45* 39* 34* 25*  CREATININE 0.62 0.59 0.57 0.54 0.46*  CALCIUM 8.6 8.7 8.6 8.6 8.7  MG 2.3 2.3 2.3 2.4  --   PHOS 3.1 3.2 3.5 2.9  --    CBC  Recent Labs Lab 05/26/13 0425 05/27/13 0450 05/28/13 0415  HGB 8.4* 7.9* 8.2*  HCT 25.4* 24.2* 25.7*  WBC 5.3 6.2 5.6  PLT 211 229 260   CXR:  NNF  ASSESSMENT / PLAN:  PULMONARY A:   Acute respiratory failure due to AMS Tracheostomy status P:   Cont trach collar 24/7 as tolerated   CARDIOVASCULAR  A:  Propofol/pentobarb induced hypotension -  resolved H/O CHF - compensated Chronic hypertension P:  Cont current Rx  RENAL A:   Acute on CRI, resolved Hypernatremia P:   - Monitor BMET. - Correct electrolytes and free water deficit as needed.  GASTROINTESTINAL A:  No issues P:   Cont TFs  HEMATOLOGIC A:  Mild anemia, without acute blood loss P:  Monitor CBC intermittently  Minimize phlebotomy Cont LMWH for DVT prophy  INFECTIOUS A:  No overt infections P:   - Micro and abx as above   ENDOCRINE  A:  DM2. Hyperglycemia. Diabetic Neuropathy P:   Cont lantus, SSI.    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 - Aggressive rehab. - PT/OT to be reordered after patient becomes more interactive per request of PT. - Neuro is following and suggests prognosis for full recovery remains poor.    Transfer to SDU. Begin DC planning   Billy Fischer, MD ; [facility] [phone].  After 5:30 PM or weekends, call [phone]

## 2013-05-30 NOTE — Progress Notes (Signed)
UR completed.  Pt to require SNF placement with letter of guarantee until Medicaid is approved. CSW aware.

## 2013-05-30 NOTE — Progress Notes (Signed)
NEURO HOSPITALIST PROGRESS NOTE   SUBJECTIVE:                                                                                                                        Patient has eyes open upon entering room and will look the the right when spoken to on the right side of the bed.  She yawned with symmetric movement of jaw but follows no commands. Husband feels she was mouthing Hymes when he was singing to her.   OBJECTIVE:                                                                                                                           Vital signs in last 24 hours: Temp:  [98.1 F (36.7 C)-98.6 F (37 C)] 98.6 F (37 C) (08/04 0805) Pulse Rate:  [63-122] 81 (08/04 0900) Resp:  [14-24] 16 (08/04 0900) BP: (114-175)/(14-100) 144/51 mmHg (08/04 0900) SpO2:  [95 %-100 %] 97 % (08/04 0900) FiO2 (%):  [28 %] 28 % (08/04 0900) Weight:  [123 kg (271 lb 2.7 oz)] 123 kg (271 lb 2.7 oz) (08/04 0500)  Intake/Output from previous day: 08/03 0701 - 08/04 0700 In: 2770 [I.V.:480; NG/GT:2290] Out: -  Intake/Output this shift: Total I/O In: 130 [I.V.:40; NG/GT:90] Out: -  Nutritional status: NPO  Past Medical History  Diagnosis Date  . Stroke   . Diabetes mellitus without complication   . Hypertension   . CHF (congestive heart failure)       Neurologic Exam:  Mental Status: Patient does not respond to verbal stimuli.  Closes eyes to deep sternal rub.  Does not follow commands.  No verbalizations noted.  Cranial Nerves: II: patient blinks t threat on the riht, pupils right 2 mm, left 2 mm,and reactive bilaterally III,IV,VI: doll's response present bilaterally.  V,VII: corneal reflex present bilaterally but decreased on the left VIII: patient opens eyes to verbal stimuli and will look at me on the left side of the bed when her name is called IX,X: gag reflex not tested, XI: trapezius strength not tested XII: tongue strength not  tested Motor: Extremities flaccid throughout.  No spontaneous movement noted today.  No purposeful movements noted. Sensory: Does not respond to noxious  stimuli in any extremity. Deep Tendon Reflexes:  Absent throughout. Plantars: equivocal bilaterally Cerebellar: Unable to perform    Lab Results: Lab Results  Component Value Date/Time   CHOL 126 05/03/2013  4:35 AM   Lipid Panel No results found for this basename: CHOL, TRIG, HDL, CHOLHDL, VLDL, LDLCALC,  in the last 72 hours  Studies/Results: No results found.  MEDICATIONS                                                                                                                        Scheduled: . antiseptic oral rinse  1 application Mouth Rinse QID  . aspirin  325 mg Per Tube Daily  . chlorhexidine  15 mL Mouth/Throat BID  . enoxaparin (LOVENOX) injection  40 mg Subcutaneous Q24H  . feeding supplement  30 mL Per Tube 5 X Daily  . free water  200 mL Per Tube Q6H  . insulin aspart  0-20 Units Subcutaneous Q4H  . insulin aspart  3 Units Subcutaneous TID WC  . insulin glargine  40 Units Subcutaneous QHS  . lacosamide  200 mg Per Tube BID  . levETIRAcetam  1,000 mg Per Tube BID  . metoprolol tartrate  25 mg Per Tube BID  . pantoprazole sodium  40 mg Per Tube Daily  . phenytoin  100 mg Per Tube TID  . sodium chloride  10-40 mL Intracatheter Q12H  . vecuronium  10 mg Intravenous Once    ASSESSMENT/PLAN:                                                                                                             57 yo F with multifocal infarcts who developed status epilepticus that was refractory requiring burst suppression with pentobarbital. Now off all sedation. Showing intermittent improvement at times more than others. Today only opens eyes to voice, looks to the right and blinks to threat on the right.    Recommendations:  1. Continue Therapy  2. Will continue to follow with you from a distance.  Discussed with  Dr. Darrol Angel   Assessment and plan discussed with with attending physician and they are in agreement.    Felicie Morn PA-C [facility] [phone]  05/30/2013, 10:23 AM

## 2013-05-30 NOTE — Clinical Social Work Placement (Signed)
Clinical Social Work Department CLINICAL SOCIAL WORK PLACEMENT NOTE 05/30/2013  Patient:  Brenda Pope, Brenda Pope  Account Number:  [id number] Admit date:  05/02/2013  Clinical Social Worker:  Macario Golds, LCSW  Date/time:  05/30/2013 11:30 AM  Clinical Social Work is seeking post-discharge placement for this patient at the following level of care:   SKILLED NURSING   (*CSW will update this form in Epic as items are completed)   05/30/2013  Patient/family provided with [facility] Department of Clinical Social Work's list of facilities offering this level of care within the geographic area requested by the patient (or if unable, by the patient's family).  05/30/2013  Patient/family informed of their freedom to choose among providers that offer the needed level of care, that participate in Medicare, Medicaid or managed care program needed by the patient, have an available bed and are willing to accept the patient.  05/30/2013  Patient/family informed of [facility]' ownership interest in [facility] as well as of the fact that they are under no obligation to receive care at this facility.  PASARR submitted to EDS on 05/30/2013 PASARR number received from EDS on 05/30/2013  FL2 transmitted to all facilities in geographic area requested by pt/family on  05/30/2013 FL2 transmitted to all facilities within larger geographic area on   Patient informed that his/her managed care company has contracts with or will negotiate with  certain facilities, including the following:     Patient/family informed of bed offers received:   Patient chooses bed at  Physician recommends and patient chooses bed at    Patient to be transferred to  on   Patient to be transferred to facility by   The following physician request were entered in Epic:   Additional Comments: 08/04 - Patient will be LOG with Medicaid Pending in [facility]

## 2013-05-30 NOTE — Progress Notes (Signed)
NUTRITION FOLLOW UP  Intervention:   1. D/C Vital AF   2. Start Glucerna 1.2 @ 25 ml/hr and increase by 10 ml every 4 hours to goal rate of 70 ml/hr with 30 ml Prostat daily.   TF regimen will provide 2116 kcal, 115 grams protein, 192 grams CHO, and 1360 ml H2O. Total free water: 2160 ml.   Nutrition Dx:   Inadequate oral intake related to inability to eat as evidenced by NPO status; ongoing.  Goal:   Enteral nutrition to provide 60-70% of estimated calorie needs (22-25 kcals/kg ideal body weight) and 100% of estimated protein needs, based on ASPEN guidelines for permissive underfeeding in critically ill obese individuals. NA  New Goal:  Pt to meet >/= 90% of their estimated nutrition needs    Monitor:   TF tolerance/adequacy, labs, weight trend, vent status, overall goals of care.  Assessment:   Pt admitted with small acute infarcts and seizures. Pt s/p trach 7/30 and PEG 8/1.  Per husband pt with liable blood sugars PTA. Stated that it was a miracle when her blood sugar was < 200.  Patient has PEG in place. Vital AF 1.2 is infusing @ 45 ml/hr. 30 ml Prostat via tube five times per day. Tube feeding regimen currently providing 1796 kcal, 156 grams protein, and 876 ml H2O.   Free water flushes: 200 ml every 6 hrs providing 800 of free water.  Total free water: 1676 ml per day.  Residuals: 0   Height: Ht Readings from Last 1 Encounters:  05/02/13 6' (1.829 m)    Weight Status:  Trending up with positive fluid status. Wt Readings from Last 1 Encounters:  05/30/13 271 lb 2.7 oz (123 kg)  Current weight range 270-272 lb with noted 2+-3+ edema  05/07/13  254 lb 3.1 oz (115.3 kg)  05/06/13  257 lb 0.9 oz (116.6 kg)  05/02/13  238 lb 1.6 oz (108 kg)   Re-estimated needs:  Kcal: 2050-2250 Protein: 100-120 grams Fluid: > 2 L/day Skin: no issues  Diet Order: NPO   Intake/Output Summary (Last 24 hours) at 05/30/13 1011 Last data filed at 05/30/13 0900  Gross per 24 hour   Intake   2605 ml  Output      0 ml  Net   2605 ml    Last BM: 8/4  Labs:   Recent Labs Lab 05/25/13 0540 05/26/13 0425 05/27/13 0450 05/28/13 0415  NA 146* 146* 149* 146*  K 3.6 3.6 3.6 3.9  CL 110 110 113* 110  CO2 30 31 31 30   BUN 45* 39* 34* 25*  CREATININE 0.59 0.57 0.54 0.46*  CALCIUM 8.7 8.6 8.6 8.7  MG 2.3 2.3 2.4  --   PHOS 3.2 3.5 2.9  --   GLUCOSE 184* 218* 154* 141*    CBG (last 3)   Recent Labs  05/29/13 2359 05/30/13 0410 05/30/13 0814  GLUCAP 198* 152* 186*    Scheduled Meds: . antiseptic oral rinse  1 application Mouth Rinse QID  . aspirin  325 mg Per Tube Daily  . chlorhexidine  15 mL Mouth/Throat BID  . enoxaparin (LOVENOX) injection  40 mg Subcutaneous Q24H  . feeding supplement  30 mL Per Tube 5 X Daily  . free water  200 mL Per Tube Q6H  . insulin aspart  0-20 Units Subcutaneous Q4H  . insulin aspart  3 Units Subcutaneous TID WC  . insulin glargine  40 Units Subcutaneous QHS  . lacosamide  200 mg  Per Tube BID  . levETIRAcetam  1,000 mg Per Tube BID  . metoprolol tartrate  25 mg Per Tube BID  . pantoprazole sodium  40 mg Per Tube Daily  . phenytoin  100 mg Per Tube TID  . sodium chloride  10-40 mL Intracatheter Q12H  . vecuronium  10 mg Intravenous Once    Continuous Infusions: . sodium chloride Stopped (05/17/13 2000)  . dextrose 5 % with KCl 20 mEq / L 20 mEq (05/30/13 0900)  . feeding supplement (VITAL AF 1.2 CAL) 1,000 mL (05/29/13 1250)    Kendell Bane RD, LDN, CNSC [phone] Pager [phone] After Hours Pager]

## 2013-05-31 DIAGNOSIS — J209 Acute bronchitis, unspecified: Secondary | ICD-10-CM | POA: Diagnosis present

## 2013-05-31 LAB — GLUCOSE, CAPILLARY
Glucose-Capillary: 116 mg/dL — ABNORMAL HIGH (ref 70–99)
Glucose-Capillary: 133 mg/dL — ABNORMAL HIGH (ref 70–99)
Glucose-Capillary: 135 mg/dL — ABNORMAL HIGH (ref 70–99)
Glucose-Capillary: 146 mg/dL — ABNORMAL HIGH (ref 70–99)

## 2013-05-31 MED ORDER — LEVOFLOXACIN 500 MG PO TABS
500.0000 mg | ORAL_TABLET | Freq: Every day | ORAL | Status: AC
  Administered 2013-05-31 – 2013-06-06 (×7): 500 mg
  Filled 2013-05-31 (×7): qty 1

## 2013-05-31 MED ORDER — LEVOFLOXACIN 25 MG/ML PO SOLN
500.0000 mg | Freq: Every day | ORAL | Status: DC
  Filled 2013-05-31: qty 20

## 2013-05-31 NOTE — Progress Notes (Signed)
CSW spoke with [facility] [phone])  about placement.   Facility admissions will take a look at Pt information and will contact CSW with a disposition for placement.   CSW to follow for d/c planning.   Leron Croak, LCSWA [facility] Emergency Dept.  [phone]

## 2013-05-31 NOTE — Progress Notes (Signed)
CSW spoke with [facility] in [place] [phone]) concerning d/c planning and disposition.    Pt has been declined at that facility.   CSW will continue to work with family for d/c planning.   Leron Croak, LCSWA [facility] Emergency Dept.  [phone]

## 2013-05-31 NOTE — Progress Notes (Addendum)
PULMONARY  / CRITICAL CARE MEDICINE  Name: Brenda Pope MRN: [id number] DOB: 12/14/1955    ADMISSION DATE:  05/02/2013 CONSULTATION DATE:  7/7/104  REFERRING MD :  Transfer from [facility] PRIMARY SERVICE:  PCCM  CHIEF COMPLAINT:  Status epilepticus  BRIEF PATIENT DESCRIPTION: 57 y/o with past medical history of CVAs/TIAs and diastolic heart failure, severe baseline hypertension with noncompliance, poorly controlled diabetes admitted to [facility] on 7/5 with acute dyspnea and suspected CHF exacerbation.  On the day of admission developed developed neurological findings consistent with acute CVA. 7/6 intubated for airway protection during suspected seizure activity.  Hypotensive post intubation requiring vasopressors. MRI confirmed small acute infarcts of the right parietal lobe. On 7/7 transferred to [facility] for further management.  SIGNIFICANT EVENTS / STUDIES:  7/05 -  Admitted to [facility] with CHF exacerbation; stroke symptoms 7/06 - Suspected seizure activity, intubated for airway protection; hypotensive post intubation requiring vasopressors 7/06 -  Brain MRI:  Multiple small acute infarcts R parietal lobe, possible small infarcts left basal ganglia and central pons 7/06 - TTE:  No source of CVA, EF 55-60% 7/06 - Carotid Doppler:  No evidence of hemodynamically significant stenosis 7/07 - Transferred to [facility], PCCM serivce 7/07 - Neuro consult: "Concern patient is on SE in the context of recent cortical stroke." 7/08 - MRI brain: Confluent and scattered right MCA and right PCA acute infarcts, plus scattered small left hemisphere and occasional posterior fossa acute infarcts. The vast majority of these infarcts are newly seen since 05/01/2013.  This appearance suggests embolic phenomena which severely affected more proximal/medium-sized vessels of the right MCA and PCA territories. Cytotoxic edema without significant mass effect at this time. No associated hemorrhage 7/08 -  EEG: severe diffuse encephalopathic process with asymmetrical involvement. Findings reported from the left hemisphere were nonspecific. Findings record from the right hemisphere were consistent with acute large area of infarction. No frank epileptiform activity was recorded. 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  7/10 - LP performed by Neuro:  7/10 - Burst suppression with propofol. Norepi initiated to prevent propofol induced hypotension. Consideration of primary CNS vasculitis. Serologies ordered>> CRP, ESR markedly elevated. High dose methylpred ordered. Insulin gtt ordered while on high dose steriods 7/10 - propofol ineffective at burst suppression. pentobarbitol initiated 7/11 - CT head: Right frontal lobe, right parietal lobe, right occipital lobe and posterior right temporal lobe acute/subacute infarcts. Surrounding edema with local mass effect. Minimal petechial hemorrhage suspected particularly along the superior margin of the infarct. No gross hemorrhage noted. No midline shift 7/11 - angiogram eval for vasculitis: smooth focal areas of narrowing of prox PCAs and distal pericallosal. Subcortical branches, suspicious of vasculitis. 7/14 - TEE: no evidence of thrombus 7/20 - MRI brain: Overall mildly regressed signal abnormality in the brain associated with multi focal infarcts. The most confluent infarcts in the right posterior MCA and PCA territories do demonstrate persistent restricted diffusion, but less cytotoxic edema. There is trace petechial hemorrhage, but no malignant hemorrhagic transformation or associated mass effect. Slight extension of a central pontine lacunar infarct (pontine perforator artery territory) without mass effect or hemorrhage 7/21 - EEG: severe encephalopathy, nonspecific as to cause. No electrography seizures  noted.  7/22 - Family indicated that they did not wish to pursue trach/G-tube and SNF. However, they were not ready to "give up" yet citing their concern  that all of the meds used to induced coma might not have fully washed out of her system yet. Noted that she had developed severe hypernatremia. Free water increased. Made DNR in event of cardiac arrest 7/24 - Family plans on one way extubation when daughter arrives back in town 7/25 or 7/26 7/27 - request from family for one more EEG to ensure slowing before removal of ETT / comfort care 7/28 - pt following commands, family is now considering Trach and rehab for patient. Husband to decide in the coming days.   7/29 - Family has decided to do trach. Trach scheduled for 10am 7/29. Husband remains unsure about PEG tube.  7/30 - Trach completed by Dr. Molli Knock 7/31 - Family ok with placement of Gastric tube per IR 8/01 - G tube placed by IR 8/05 - More alert. + F/C. Transferred to SDU  LINES / TUBES: L radial A-line 7/10 >> 7/12 R IJ CVL 7/6 >> 7/21 ETT 7/6 >> 7/30 Trach (JY) 7/30  >>  G tube (IR) 8/1 >>  RUE PICC 8/1 >>   CULTURES: MRSA PCR 7/07 >> POS Blood culture >> NEG CSF 7/10 >> NEG C diff 8/02 >> NEG Resp 8/05 >>    ANTIBIOTICS: Levoflox 8/05 >>   SUBJ:  RASS 0. + F/C   VITAL SIGNS: Temp:  [97.5 F (36.4 C)-98.8 F (37.1 C)] 98.8 F (37.1 C) (08/05 0940) Pulse Rate:  [55-79] 71 (08/05 1205) Resp:  [13-26] 16 (08/05 1205) BP: (116-170)/(44-111) 147/59 mmHg (08/05 1205) SpO2:  [96 %-100 %] 100 % (08/05 1205) FiO2 (%):  [28 %] 28 % (08/05 1205) Weight:  [124.2 kg (273 lb 13 oz)] 124.2 kg (273 lb 13 oz) (08/05 0100)  HEMODYNAMICS:   VENTILATOR SETTINGS: Vent Mode:  [-]  FiO2 (%):  [28 %] 28 %  INTAKE / OUTPUT: Intake/Output     08/04 0701 - 08/05 0700 08/05 0701 - 08/06 0700   I.V. (mL/kg) 60 (0.5)    NG/GT 1280 65   Total Intake(mL/kg) 1340 (10.8) 65 (0.5)   Net +1340 +65        Urine Occurrence 7 x     Stool Occurrence 3 x      PHYSICAL EXAMINATION: General: RASS 0, NAD Neuro: + F/C, MAEs, diffusely weak HEENT: PERRL, EOMI Cardiovascular:  RRR s M Lungs: Purulent trach secretions, scattered rhonchi Abdomen:  Soft, nontender, bowel sounds diminished Ext: warm, trace edema all 4 ext  LABS: BMET  Recent Labs Lab 05/25/13 0540 05/26/13 0425 05/27/13 0450 05/28/13 0415  NA 146* 146* 149* 146*  K 3.6 3.6 3.6 3.9  CL 110 110 113* 110  CO2 30 31 31 30   GLUCOSE 184* 218* 154* 141*  BUN 45* 39* 34* 25*  CREATININE 0.59 0.57 0.54 0.46*  CALCIUM 8.7 8.6 8.6 8.7  MG 2.3 2.3 2.4  --   PHOS 3.2 3.5 2.9  --    CBC  Recent Labs Lab 05/26/13 0425 05/27/13 0450 05/28/13 0415  HGB 8.4* 7.9* 8.2*  HCT 25.4* 24.2* 25.7*  WBC 5.3 6.2 5.6  PLT 211 229 260   CXR:  NNF  ASSESSMENT / PLAN:  PULMONARY A:   Acute respiratory failure due to AMS Tracheostomy status P:   Cont trach collar as tolerated   CARDIOVASCULAR  A:  Propofol/pentobarb induced hypotension, resolved H/O CHF, compensated Chronic hypertension P:  Cont current Rx  RENAL A:   Acute on CRI, resolved Hypernatremia P:   Monitor BMET intermittently  Correct electrolytes and free water deficit as needed.  GASTROINTESTINAL A:  No issues P:   Cont TFs SUP not indicated presently  HEMATOLOGIC A:  Mild anemia, without acute blood loss P:  Monitor CBC intermittently  Minimize phlebotomy Cont LMWH for DVT prophy  INFECTIOUS A:  Purulent tracheobronchitis (doubt PNA in absence of fever or resp distress) P:   Micro and abx as above CXR 8/06 to R/O infiltrate  ENDOCRINE  A:  DM2. Hyperglycemia improved on Glucerna Diabetic Neuropathy P:   Cont lantus, SSI.    NEUROLOGIC A:   Acute CVA in multiple territories, unclear etiology Concern for CNS vasculitis-received pulse dose steroids Persistent coma, resolved - LOC much improved Status epilepticus, resolved Seizure d/o, controlled P:   Cont  triple AEDs for now. Would clarify with Neuro whether all 3 need to be continued Cont PT/OT   Has been accepted to SNF once deemed stable enough.  Needs first trach change and SLP for PMV, neurocognitive eval prior to discharge.  Will need trach clinic f/u in 6 wks after discharge   Billy Fischer, MD ; [facility] [phone].  After 5:30 PM or weekends, call [phone]

## 2013-05-31 NOTE — Progress Notes (Signed)
CSW contacted [facility] [phone]) for d/c planning.   Facility is considering accepting Pt.   [facility] Land will check with facility administrator and contact CSW back for possible acceptance of Pt into facility.   CSW following for d/c planning to SNF with LOG.   Leron Croak, LCSWA [facility].  [phone]

## 2013-06-01 ENCOUNTER — Inpatient Hospital Stay (HOSPITAL_COMMUNITY): Payer: Medicaid Other

## 2013-06-01 LAB — GLUCOSE, CAPILLARY
Glucose-Capillary: 104 mg/dL — ABNORMAL HIGH (ref 70–99)
Glucose-Capillary: 140 mg/dL — ABNORMAL HIGH (ref 70–99)
Glucose-Capillary: 164 mg/dL — ABNORMAL HIGH (ref 70–99)

## 2013-06-01 LAB — BASIC METABOLIC PANEL
BUN: 23 mg/dL (ref 6–23)
Creatinine, Ser: 0.52 mg/dL (ref 0.50–1.10)
GFR calc Af Amer: >90 mL/min (ref >90)
GFR calc non Af Amer: >90 mL/min (ref >90)

## 2013-06-01 LAB — CBC
MCHC: 32.9 g/dL (ref 30.0–36.0)
Platelets: 295 10*3/uL (ref 150–400)
RDW: 13.6 % (ref 11.5–15.5)
WBC: 6.8 10*3/uL (ref 4.0–10.5)

## 2013-06-01 NOTE — Progress Notes (Signed)
Subjective: Has had some improvements  Exam: Filed Vitals:   06/01/13 0910  BP: 153/61  Pulse: 76  Temp:   Resp: 18   Gen: In bed, NAD MS: Opens eyes to voice, does follow commands on the right.  CN:No blink to threat from left,  Motor: No voluntary movements left side, on right shows thumb to command, wiggles toes after nox stim.  Sensory:as above.   Impression: 57 yo F with multifocal strokes on admission who subsequently developed status epilepticus. Her presentation is very confusing, and she was treated with steroids for concern for vasculitis, however there is no supportive evidence found after workup. Though there was initial concern for Lyme antibodies, confirmatory western blot did not confirm. Other than mildly elevated protein, CSF was normal. Her angiogram did show some focal areas of narrowing, but these were very isolated and I feel would be more consistent with atherosclerosis then vasculitis.  Recommendations: 1) continue antiplatelet therapy for secondary stroke prevention 2) continue antiepileptics at current doses 3) at this time no further neurodiagnostic studies to offer, I discussed with the husband and neurology will sign off and he agreed. If any further questions arise please call.  [name], MD [facility] [phone]  If 7pm- 7am, please page neurology on call at [phone].

## 2013-06-01 NOTE — Evaluation (Signed)
Passy-Muir Speaking Valve - Evaluation Patient Details  Name: Brenda Pope MRN: [id number] Date of Birth: Feb 05, 1956  Today's Date: 06/01/2013 Time: 1440-1500 SLP Time Calculation (min): 20 min  Past Medical History:  Past Medical History  Diagnosis Date  . Stroke   . Diabetes mellitus without complication   . Hypertension   . CHF (congestive heart failure)    Past Surgical History: History reviewed. No pertinent past surgical history. HPI:  56 y/o with past medical history of CVAs/TIAs and diastolic heart failure, severe baseline hypertension with noncompliance, poorly controlled diabetes admitted to [facility] on 7/5 with acute dyspnea and suspected CHF exacerbation.  On the day of admission developed developed neurological findings consistent with acute CVA. 7/6 intubated for airway protection during suspected seizure activity.  Hypotensive post intubation requiring vasopressors. MRI confirmed small acute infarcts of the right parietal lobe. On 7/7 transferred to [facility] for further management.  7/6 intubated, 7/30 trach; 8/1 PEG.  MRI 7/19: The most confluent infarcts in the right posterior MCA and PCA territories do demonstrate persistent restricted diffusion, but less cytotoxic edema. Thereis trace petechial hemorrhage, but no malignant hemorrhagic transformation or associated mass effect. Slight extension of a central pontine lacunar infarct (pontine perforator artery territory) without mass effect or hemorrhage.   Currently with #6 Shiley with cuff deflated; for downsize to cuffless #4 today per RN.     Assessment / Plan / Recommendation Clinical Impression  PMSV eval completed.  Pt more difficult to arouse today per spouse.  Pt alerted for brief moments, mouthed "thank you" and "hello" when cued by husband, but had difficulty sustaining attention to task.  Valve placed for 10-20 second intervals, triggering consistent cough, with copious, thick secretions expelled from trach.  Pt unable  to consistently direct air through upper airway.   Trach downsize pending today; will follow next date for improved alertness and valve toleration.      SLP Assessment  Patient needs continued Speech Language Pathology Services    Follow Up Recommendations  Skilled Nursing facility    Frequency and Duration min 3x week  2 weeks       SLP Goals Potential to Achieve Goals: Good Progress/Goals/Alternative treatment plan discussed with pt/caregiver and they: Agree SLP Goal #1: Pt will utilize upper airway for cough/phonation with max cues and stable VS with PMSV in place.   PMSV Trial  PMSV was placed for: 10-20 second intervals Able to redirect subglottic air through upper airway: No Able to Attain Phonation: Yes Voice Quality: Aphonic;Hoarse;Low vocal intensity Able to Expectorate Secretions: Yes Level of Secretion Expectoration with PMSV: Oral Breath Support for Phonation: Inadequate Intelligibility: Intelligibility reduced Phrase: 0-24% accurate Respirations During Trial: 17 SpO2 During Trial: 70 % Pulse During Trial: 99   Tracheostomy Tube    #6 cuff deflated   Vent Dependency  FiO2 (%): 28 %    Cuff Deflation Trial Tolerated Cuff Deflation: Yes Length of Time for Cuff Deflation Trial: baseline Behavior: Listless           [name] 06/01/2013, 3:38 PM

## 2013-06-01 NOTE — Progress Notes (Signed)
PULMONARY  / CRITICAL CARE MEDICINE  Name: Brenda Pope MRN: [id number] DOB: [birth year]    ADMISSION DATE:  05/02/2013 CONSULTATION DATE:  7/7/104  REFERRING MD :  Transfer from [facility] PRIMARY SERVICE:  PCCM  CHIEF COMPLAINT:  Status epilepticus  BRIEF PATIENT DESCRIPTION: 57 y/o with past medical history of CVAs/TIAs and diastolic heart failure, severe baseline hypertension with noncompliance, poorly controlled diabetes admitted to [facility] on 7/5 with acute dyspnea and suspected CHF exacerbation.  On the day of admission developed developed neurological findings consistent with acute CVA. 7/6 intubated for airway protection during suspected seizure activity.  Hypotensive post intubation requiring vasopressors. MRI confirmed small acute infarcts of the right parietal lobe. On 7/7 transferred to [facility] for further management.  SIGNIFICANT EVENTS / STUDIES:  7/05 -  Admitted to [facility] with CHF exacerbation; stroke symptoms 7/06 - Suspected seizure activity, intubated for airway protection; hypotensive post intubation requiring vasopressors 7/06 -  Brain MRI:  Multiple small acute infarcts R parietal lobe, possible small infarcts left basal ganglia and central pons 7/06 - TTE:  No source of CVA, EF 55-60% 7/06 - Carotid Doppler:  No evidence of hemodynamically significant stenosis 7/07 - Transferred to [facility], PCCM serivce 7/07 - Neuro consult: "Concern patient is on SE in the context of recent cortical stroke." 7/08 - MRI brain: Confluent and scattered right MCA and right PCA acute infarcts, plus scattered small left hemisphere and occasional posterior fossa acute infarcts. The vast majority of these infarcts are newly seen since 05/01/2013.  This appearance suggests embolic phenomena which severely affected more proximal/medium-sized vessels of the right MCA and PCA territories. Cytotoxic edema without significant mass effect at this time. No associated hemorrhage 7/08 -  EEG: severe diffuse encephalopathic process with asymmetrical involvement. Findings reported from the left hemisphere were nonspecific. Findings record from the right hemisphere were consistent with acute large area of infarction. No frank epileptiform activity was recorded. 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  7/10 - LP performed by Neuro:  7/10 - Burst suppression with propofol. Norepi initiated to prevent propofol induced hypotension. Consideration of primary CNS vasculitis. Serologies ordered>> CRP, ESR markedly elevated. High dose methylpred ordered. Insulin gtt ordered while on high dose steriods 7/10 - propofol ineffective at burst suppression. pentobarbitol initiated 7/11 - CT head: Right frontal lobe, right parietal lobe, right occipital lobe and posterior right temporal lobe acute/subacute infarcts. Surrounding edema with local mass effect. Minimal petechial hemorrhage suspected particularly along the superior margin of the infarct. No gross hemorrhage noted. No midline shift 7/11 - angiogram eval for vasculitis: smooth focal areas of narrowing of prox PCAs and distal pericallosal. Subcortical branches, suspicious of vasculitis. 7/14 - TEE: no evidence of thrombus 7/20 - MRI brain: Overall mildly regressed signal abnormality in the brain associated with multi focal infarcts. The most confluent infarcts in the right posterior MCA and PCA territories do demonstrate persistent restricted diffusion, but less cytotoxic edema. There is trace petechial hemorrhage, but no malignant hemorrhagic transformation or associated mass effect. Slight extension of a central pontine lacunar infarct (pontine perforator artery territory) without mass effect or hemorrhage 7/21 - EEG: severe encephalopathy, nonspecific as to cause. No electrography seizures  noted.  7/22 - Family indicated that they did not wish to pursue trach/G-tube and SNF. However, they were not ready to "give up" yet citing their concern  that all of the meds used to induced coma might not have fully washed out of her system yet. Noted that she had developed severe hypernatremia. Free water increased. Made DNR in event of cardiac arrest 7/24 - Family plans on one way extubation when daughter arrives back in town 7/25 or 7/26 7/27 - request from family for one more EEG to ensure slowing before removal of ETT / comfort care 7/28 - pt following commands, family is now considering Trach and rehab for patient. Husband to decide in the coming days.   7/29 - Family has decided to do trach. Trach scheduled for 10am 7/29. Husband remains unsure about PEG tube.  7/30 - Trach completed by Dr. Molli Knock 7/31 - Family ok with placement of Gastric tube per IR 8/01 - G tube placed by IR 8/05 - More alert. + F/C. Transferred to SDU  LINES / TUBES: L radial A-line 7/10 >> 7/12 R IJ CVL 7/6 >> 7/21 ETT 7/6 >> 7/30 Trach (JY) 7/30  >>  G tube (IR) 8/1 >>  RUE PICC 8/1 >>   CULTURES: MRSA PCR 7/07 >> POS Blood culture >> NEG CSF 7/10 >> NEG C diff 8/02 >> NEG Resp 8/05 >>    ANTIBIOTICS: Levoflox 8/05 >>   SUBJ:  afebrile RASS 0. + F/C   VITAL SIGNS: Temp:  [98 F (36.7 C)-98.9 F (37.2 C)] 98.8 F (37.1 C) (08/06 0831) Pulse Rate:  [71-81] 73 (08/06 0409) Resp:  [15-22] 19 (08/06 0409) BP: (143-170)/(52-71) 151/53 mmHg (08/06 0409) SpO2:  [91 %-100 %] 100 % (08/06 0409) FiO2 (%):  [28 %] 28 % (08/06 0409)  HEMODYNAMICS:   VENTILATOR SETTINGS: Vent Mode:  [-]  FiO2 (%):  [28 %] 28 %  INTAKE / OUTPUT: Intake/Output     08/05 0701 - 08/06 0700 08/06 0701 - 08/07 0700   I.V. (mL/kg)     Other 400    NG/GT 135 70   Total Intake(mL/kg) 535 (4.3) 70 (0.6)   Net +535 +70        Urine Occurrence 5 x    Stool Occurrence 5 x      PHYSICAL EXAMINATION: General: RASS 0,  NAD Neuro: + F/C, MAEs, diffusely weak HEENT: PERRL, EOMI Cardiovascular:  RRR s M Lungs: Purulent trach secretions, scattered rhonchi Abdomen:  Soft, nontender, bowel sounds diminished Ext: warm, trace edema all 4 ext  LABS: BMET  Recent Labs Lab 05/26/13 0425 05/27/13 0450 05/28/13 0415 06/01/13 0420  NA 146* 149* 146* 143  K 3.6 3.6 3.9 3.6  CL 110 113* 110 106  CO2 31 31 30  33*  GLUCOSE 218* 154* 141* 161*  BUN 39* 34* 25* 23  CREATININE 0.57 0.54 0.46* 0.52  CALCIUM 8.6 8.6 8.7 8.5  MG 2.3 2.4  --   --   PHOS 3.5 2.9  --   --    CBC  Recent Labs Lab 05/27/13 0450 05/28/13 0415 06/01/13 0420  HGB 7.9* 8.2* 7.5*  HCT 24.2* 25.7* 22.8*  WBC 6.2 5.6 6.8  PLT 229 260 295   CXR:  NNF  ASSESSMENT / PLAN:  PULMONARY A:   Acute respiratory failure due to AMS Tracheostomy status P:   Cont trach collar as tolerated  Change to 6 cuffless  CARDIOVASCULAR  A:  Propofol/pentobarb induced hypotension, resolved H/O CHF, compensated Chronic hypertension P:  Cont lopressor  RENAL A:   Acute on CRI, resolved Hypernatremia P:   Monitor BMET intermittently Correct electrolytes and free  water deficit as needed.  GASTROINTESTINAL A:  No issues P:   Cont TFs SUP not indicated presently  HEMATOLOGIC A:  Mild anemia, without acute blood loss P:  Monitor CBC intermittently  Minimize phlebotomy Cont LMWH for DVT prophy  INFECTIOUS A:  Purulent tracheobronchitis (doubt PNA in absence of fever or resp distress) P:   levaquin x 7ds total   ENDOCRINE  A:  DM2. Hyperglycemia improved on Glucerna Diabetic Neuropathy P:   Cont lantus, SSI.    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 Would clarify with Neuro whether all 3 need to be  continued Resume  PT/OT - I ordered Advance PMV   Has been accepted to SNF once deemed stable enough.  Needs first trach change and SLP for PMV, neurocognitive eval prior to discharge.  Will need trach clinic f/u in 6 wks after discharge   Cyril Mourning MD. [facility]. [facility] Pager [phone] If no response call [phone]

## 2013-06-01 NOTE — Progress Notes (Signed)
CSW provided list of bed offers for SNF facilities. At this time Pt's sopuse is aware that no bed offers have been made. Pt's spouse continues to be hopeful that Pt will be able to participate with PT/OT at a short term nursing placement or with HHPT/Nursing in place.   CSW provided empathetic listening and support for Pt's spouse.   CSW will continue to follow along with spouse for d/c planning.   Leron Croak, LCSWA [facility] Emergency Dept.  [phone]

## 2013-06-01 NOTE — Progress Notes (Signed)
Physical Therapy Evaluation Patient Details Name: Brenda Pope MRN: [id number] DOB: [birth year] Today's Date: 06/01/2013 Time: 7829-5621 PT Time Calculation (min): 20 min  PT Assessment / Plan / Recommendation History of Present Illness    Pt admit with CVA.  7/6 intubated for airway protection during suspected seizure activity. Hypotensive post intubation requiring vasopressors. MRI confirmed small acute infarcts of the right parietal lobe.  Pt was in a coma for most of stay.   Clinical Impression  Pt admitted with CVA and VDRF. Pt currently with functional limitations due to the deficits listed below (see PT Problem List). Pt profoundly weak given prolonged bedrest.  Will definitely  need LTACH or SNF.Pt ill benefit from skilled PT to increase their independence and safety with mobility to allow discharge to the venue listed below.   PT Assessment  Patient needs continued PT services    Follow Up Recommendations  Supervision/Assistance - 24 hour;LTACH;SNF                Equipment Recommendations  Other (comment) (TBA)         Frequency Min 3X/week    Precautions / Restrictions Precautions Precautions: Fall Restrictions Weight Bearing Restrictions: No   Pertinent Vitals/Pain VSS, no pain      Mobility  Bed Mobility Bed Mobility: Not assessed Transfers Transfers: Not assessed Ambulation/Gait Ambulation/Gait Assistance: Not tested (comment) Stairs: No Wheelchair Mobility Wheelchair Mobility: No Modified Rankin (Stroke Patients Only) Pre-Morbid Rankin Score: Moderately severe disability Modified Rankin: Severe disability    Exercises General Exercises - Upper Extremity Shoulder Flexion: PROM;Both;10 reps;Supine Shoulder ABduction: PROM;Both;5 reps;Supine Elbow Flexion: PROM;Both;5 reps;Supine Wrist Flexion: PROM;Both;10 reps;Supine Wrist Extension: PROM;Both;5 reps;Supine General Exercises - Lower Extremity Ankle Circles/Pumps: PROM;Both;10 reps;Supine Heel  Slides: PROM;Both;10 reps;Supine   PT Diagnosis: Generalized weakness  PT Problem List: Decreased range of motion;Decreased activity tolerance;Decreased balance;Decreased mobility;Decreased knowledge of use of DME;Decreased safety awareness;Decreased knowledge of precautions;Impaired sensation PT Treatment Interventions: DME instruction;Functional mobility training;Therapeutic activities;Therapeutic exercise;Balance training;Cognitive remediation;Patient/family education;Neuromuscular re-education     PT Goals(Current goals can be found in the care plan section) Acute Rehab PT Goals Patient Stated Goal: To go home per husband PT Goal Formulation: With family Time For Goal Achievement: 06/15/13 Potential to Achieve Goals: Fair  Visit Information  Last PT Received On: 06/01/13 Assistance Needed: +3 or more       Prior Functioning  Home Living Family/patient expects to be discharged to:: Private residence Living Arrangements: Spouse/significant other;Other relatives Available Help at Discharge: Family;Available 24 hours/day Type of Home: House Home Access: Ramped entrance Home Layout: One level (inch drop to bathroom and to living room) Home Equipment: Electric scooter;Bedside commode;Walker - 2 wheels;Walker - 4 wheels;Cane - single point;Hand held shower head (HH to trade to power chair for pt/ handicapped commode) Additional Comments: has 2 bedside commodes and puts one in room and one in shower Prior Function Level of Independence: Independent with assistive device(s);Needs assistance (used cane ) Gait / Transfers Assistance Needed: min guard assist transfers to scooter, min assist for ambulation iwth cane due to staggers ADL's / Homemaking Assistance Needed: Bathing and dressing min assist Communication / Swallowing Assistance Needed: Normal Communication Communication: Tracheostomy Dominant Hand: Right    Cognition  Cognition Arousal/Alertness: Lethargic Behavior During  Therapy: Flat affect Overall Cognitive Status: History of cognitive impairments - at baseline Memory: Decreased short-term memory (after previous CVA per husband)    Extremity/Trunk Assessment Upper Extremity Assessment Upper Extremity Assessment: Defer to OT evaluation Lower Extremity Assessment Lower Extremity Assessment: RLE  deficits/detail;LLE deficits/detail;Difficult to assess due to impaired cognition RLE Deficits / Details: grossly 0/5 RLE Sensation: history of peripheral neuropathy LLE Deficits / Details: grossly 0/5 LLE Sensation: history of peripheral neuropathy  Did not note any movement to command today except for squeezing right hand.  Pt also opened eyes to command.    Balance Balance Balance Assessed: Yes  End of Session PT - End of Session Equipment Utilized During Treatment: Gait belt;Oxygen Activity Tolerance: Patient limited by fatigue;Patient limited by lethargy Patient left: in bed;with call bell/phone within reach;with family/visitor present Nurse Communication: Mobility status;Need for lift equipment       INGOLD,Skyah Hannon 06/01/2013, 1:57 PM [facility] [phone] (pager)

## 2013-06-02 DIAGNOSIS — I6330 Cerebral infarction due to thrombosis of unspecified cerebral artery: Secondary | ICD-10-CM

## 2013-06-02 LAB — GLUCOSE, CAPILLARY
Glucose-Capillary: 195 mg/dL — ABNORMAL HIGH (ref 70–99)
Glucose-Capillary: 212 mg/dL — ABNORMAL HIGH (ref 70–99)
Glucose-Capillary: 192 mg/dL — ABNORMAL HIGH (ref 70–99)
Glucose-Capillary: 170 mg/dL — ABNORMAL HIGH (ref 70–99)
Glucose-Capillary: 172 mg/dL — ABNORMAL HIGH (ref 70–99)

## 2013-06-02 LAB — CULTURE, RESPIRATORY W GRAM STAIN

## 2013-06-02 NOTE — Progress Notes (Signed)
CSW spoke with Justin Mend at [facility] and Pt was denied placement at this time due the acuity level at the facility.   CSW also contacted [facility] for possible placement.  [facility] currently not available. CSW left a message for request to consider Pt for placement with [facility].   CSW awaiting a callback for possible placement.   Leron Croak, LCSWA [facility] Emergency Dept.  [phone]

## 2013-06-02 NOTE — Progress Notes (Signed)
CSW spoke with [name] concerning any new bed offers.   [name] is aware that [facility] stated they will not be able to meet the needs of the Pt and not be able to extend a bed offer.   [name] stated that he had left a message for Rodanthe at [place] and he will attempt to contact her concerning reason's for non admission.   [name] is concerned that the Pt will not be able to get the care she needs. CSW reassured [name] that CSW and staff are doing everything that we can to make sure Pt is taken care of.   CSW answered Social Security questions and process. [name] will be reapplying for disability for Pt and trying to get an approval for Pt so that Pt can receive the care she needs.   [name] is appreciative of care and assistance.   CSW will continue to work with family for d/c planning.   Leron Croak, LCSWA [facility] Emergency Dept.  [phone]

## 2013-06-02 NOTE — Progress Notes (Signed)
PULMONARY  / CRITICAL CARE MEDICINE  Name: Brenda Pope MRN: [id number] DOB: 12-03-1955    ADMISSION DATE:  05/02/2013 CONSULTATION DATE:  7/7/104  REFERRING MD :  Transfer from [facility] PRIMARY SERVICE:  PCCM  CHIEF COMPLAINT:  Status epilepticus  BRIEF PATIENT DESCRIPTION: 57 y/o with past medical history of CVAs/TIAs and diastolic heart failure, severe baseline hypertension with noncompliance, poorly controlled diabetes admitted to [facility] on 7/5 with acute dyspnea and suspected CHF exacerbation.  On the day of admission developed developed neurological findings consistent with acute CVA. 7/6 intubated for airway protection during suspected seizure activity.  Hypotensive post intubation requiring vasopressors. MRI confirmed small acute infarcts of the right parietal lobe. On 7/7 transferred to [facility] for further management.  SIGNIFICANT EVENTS / STUDIES:  7/05 -  Admitted to [facility] with CHF exacerbation; stroke symptoms 7/06 - Suspected seizure activity, intubated for airway protection; hypotensive post intubation requiring vasopressors 7/06 -  Brain MRI:  Multiple small acute infarcts R parietal lobe, possible small infarcts left basal ganglia and central pons 7/06 - TTE:  No source of CVA, EF 55-60% 7/06 - Carotid Doppler:  No evidence of hemodynamically significant stenosis 7/07 - Transferred to [facility], PCCM serivce 7/07 - Neuro consult: "Concern patient is on SE in the context of recent cortical stroke." 7/08 - MRI brain: Confluent and scattered right MCA and right PCA acute infarcts, plus scattered small left hemisphere and occasional posterior fossa acute infarcts. The vast majority of these infarcts are newly seen since 05/01/2013.  This appearance suggests embolic phenomena which severely affected more proximal/medium-sized vessels of the right MCA and PCA territories. Cytotoxic edema without significant mass effect at this time. No associated hemorrhage 7/08 -  EEG: severe diffuse encephalopathic process with asymmetrical involvement. Findings reported from the left hemisphere were nonspecific. Findings record from the right hemisphere were consistent with acute large area of infarction. No frank epileptiform activity was recorded. 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  7/10 - LP performed by Neuro:  7/10 - Burst suppression with propofol. Norepi initiated to prevent propofol induced hypotension. Consideration of primary CNS vasculitis. Serologies ordered>> CRP, ESR markedly elevated. High dose methylpred ordered. Insulin gtt ordered while on high dose steriods 7/10 - propofol ineffective at burst suppression. pentobarbitol initiated 7/11 - CT head: Right frontal lobe, right parietal lobe, right occipital lobe and posterior right temporal lobe acute/subacute infarcts. Surrounding edema with local mass effect. Minimal petechial hemorrhage suspected particularly along the superior margin of the infarct. No gross hemorrhage noted. No midline shift 7/11 - angiogram eval for vasculitis: smooth focal areas of narrowing of prox PCAs and distal pericallosal. Subcortical branches, suspicious of vasculitis. 7/14 - TEE: no evidence of thrombus 7/20 - MRI brain: Overall mildly regressed signal abnormality in the brain associated with multi focal infarcts. The most confluent infarcts in the right posterior MCA and PCA territories do demonstrate persistent restricted diffusion, but less cytotoxic edema. There is trace petechial hemorrhage, but no malignant hemorrhagic transformation or associated mass effect. Slight extension of a central pontine lacunar infarct (pontine perforator artery territory) without mass effect or hemorrhage 7/21 - EEG: severe encephalopathy, nonspecific as to cause. No electrography seizures  noted.  7/22 - Family indicated that they did not wish to pursue trach/G-tube and SNF. However, they were not ready to "give up" yet citing their concern  that all of the meds used to induced coma might not have fully washed out of her system yet. Noted that she had developed severe hypernatremia. Free water increased. Made DNR in event of cardiac arrest 7/24 - Family plans on one way extubation when daughter arrives back in town 7/25 or 7/26 7/27 - request from family for one more EEG to ensure slowing before removal of ETT / comfort care 7/28 - pt following commands, family is now considering Trach and rehab for patient. Husband to decide in the coming days.   7/29 - Family has decided to do trach. Trach scheduled for 10am 7/29. Husband remains unsure about PEG tube.  7/30 - Trach completed by Dr. Molli Knock 7/31 - Family ok with placement of Gastric tube per IR 8/01 - G tube placed by IR 8/05 - More alert. + F/C. Transferred to SDU  LINES / TUBES: L radial A-line 7/10 >> 7/12 R IJ CVL 7/6 >> 7/21 ETT 7/6 >> 7/30 Trach (JY) 7/30  >> 8/6 cuffless 6 >> G tube (IR) 8/1 >>  RUE PICC 8/1 >>   CULTURES: MRSA PCR 7/07 >> POS Blood culture >> NEG CSF 7/10 >> NEG C diff 8/02 >> NEG Resp 8/05 >>    ANTIBIOTICS: Levoflox 8/05 >>   SUBJ:  afebrile PT working with her   VITAL SIGNS: Temp:  [98.1 F (36.7 C)-98.9 F (37.2 C)] 98.6 F (37 C) (08/07 0747) Pulse Rate:  [67-84] 75 (08/07 0749) Resp:  [6-22] 20 (08/07 0749) BP: (149-176)/(51-60) 152/55 mmHg (08/07 0747) SpO2:  [96 %-100 %] 98 % (08/07 0749) FiO2 (%):  [28 %] 28 % (08/07 0747)  HEMODYNAMICS:   VENTILATOR SETTINGS: Vent Mode:  [-]  FiO2 (%):  [28 %] 28 %  INTAKE / OUTPUT: Intake/Output     08/06 0701 - 08/07 0700 08/07 0701 - 08/08 0700   Other 450    NG/GT 1380 140   Total Intake(mL/kg) 1830 (14.7) 140 (1.1)   Net +1830 +140        Urine Occurrence 8 x 1 x   Stool Occurrence 1 x      PHYSICAL  EXAMINATION: General: RASS 0, NAD Neuro: + F/C, MAEs, diffusely weak HEENT: PERRL, EOMI Cardiovascular:  RRR s M Lungs: Purulent trach secretions, scattered rhonchi Abdomen:  Soft, nontender, bowel sounds diminished Ext: warm, trace edema all 4 ext  LABS: BMET  Recent Labs Lab 05/27/13 0450 05/28/13 0415 06/01/13 0420  NA 149* 146* 143  K 3.6 3.9 3.6  CL 113* 110 106  CO2 31 30 33*  GLUCOSE 154* 141* 161*  BUN 34* 25* 23  CREATININE 0.54 0.46* 0.52  CALCIUM 8.6 8.7 8.5  MG 2.4  --   --   PHOS 2.9  --   --    CBC  Recent Labs Lab 05/27/13 0450 05/28/13 0415 06/01/13 0420  HGB 7.9* 8.2* 7.5*  HCT 24.2* 25.7* 22.8*  WBC 6.2 5.6 6.8  PLT 229 260 295   CXR:  NNF  ASSESSMENT / PLAN:  PULMONARY A:   Acute respiratory failure due to AMS Tracheostomy status P:   Changed to 6 cuffless, progress PMV when stronger  CARDIOVASCULAR  A:  Propofol/pentobarb induced hypotension, resolved H/O CHF, compensated Chronic hypertension P:  Cont lopressor  RENAL A:   Acute on CRI, resolved Hypernatremia P:   Monitor BMET intermittently Correct electrolytes and free water deficit as needed.  GASTROINTESTINAL A:  No issues P:   Cont TFs SUP  not indicated presently  HEMATOLOGIC A:  Mild anemia, without acute blood loss P:  Monitor CBC intermittently  Minimize phlebotomy Cont LMWH for DVT prophy  INFECTIOUS A:  Purulent tracheobronchitis (doubt PNA in absence of fever or resp distress) P:   levaquin x 7ds total   ENDOCRINE  A:  DM2. Hyperglycemia improved on Glucerna Diabetic Neuropathy P:   Cont lantus, SSI.    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 Resumed  PT/OT  Advance PMV   SNF vs LTAC Will need trach clinic f/u in 6 wks  after discharge Updated husband  Cyril Mourning MD. [facility]. [place] [facility] Pager [phone] If no response call [phone]

## 2013-06-02 NOTE — Progress Notes (Signed)
Passy-Muir Speaking Valve - Treatment Patient Details  Name: Brenda Pope MRN: [id number] Date of Birth: Jul 30, 1956  Today's Date: 06/02/2013 Time: 1400-1415 SLP Time Calculation (min): 15 min  Past Medical History:  Past Medical History  Diagnosis Date  . Stroke   . Diabetes mellitus without complication   . Hypertension   . CHF (congestive heart failure)    Past Surgical History: History reviewed. No pertinent past surgical history.  Assessment / Plan / Recommendation Clinical Impression  F/u for PMSV toleration/use.  Pt now with cuffless #6 trach.  With valve in place, able to achieve hoarse/glottal fry-like phonation with low intensity, max cues for breath support and timing.  SpO2 remained at 99-100% with valve in place; RR/HR stable.  No oral secretion production.  Pt noted with spontaneous swallow response.  More alert today, able to sustain attention for duration of 15 minute session.    Plan  Continue with current plan of care    Follow Up Recommendations  Inpatient Rehab    Pertinent Vitals/Pain No c/o pain    SLP Goals Potential to Achieve Goals: Good SLP Goal #1: Pt will utilize upper airway for cough/phonation with max cues and stable VS with PMSV in place. SLP Goal #1 - Progress: Progressing toward goal max assist today   PMSV Trial  PMSV was placed for: 10 minutes Able to redirect subglottic air through upper airway: Yes Voice Quality: Aphonic;Hoarse;Low vocal intensity Able to Expectorate Secretions: No Breath Support for Phonation: Inadequate Intelligibility: Intelligibility reduced Word: 25-49% accurate Phrase: 25-49% accurate Sentence: 25-49% accurate Respirations During Trial: 26 SpO2 During Trial: 99 % Pulse During Trial: 80 Behavior: Cooperative;Responsive to questions   Tracheostomy Tube    #6 Shiley cuffless   Vent Dependency  FiO2 (%): 28 %      GO         [name] 06/02/2013, 4:14 PM

## 2013-06-02 NOTE — Consult Note (Signed)
Physical Medicine and Rehabilitation Consult  Reason for Consult: Dense left hemiparesis, left inattention, dysphagia, diffuse weaken ess with encephalopathy.  Referring Physician:  Dr. Vassie Loll.    HPI: Brenda Pope is a 57 y.o. 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 Patient with decline in mental status intubated for airway protection due to twitching of LUE and suspicion of seizures . MRI brain with multiple acute infarcts right parietal and left basal ganglia and central pons. Was started on Keppra. 2D echo with EF 55-60%. Carotid dopplers without ICA stenosis. She was transferred to [facility] on 05/02/13 for further management. Patient off sedatives but unresponsive and exhibiting LUE movements. Neurology recommended continuous EEG monitoring as well as repeat MRI for stroke extension. MRI with confluent/scattered R-MCA and R-PCA infarcts ( majority new c/w 07/06 ), cytotoxic edema without hemorrhage or mass effect.  Cerebral angio with smooth focal areas of narrowing with question of vasculitis v/s infectious process. LP done and treated with pulse dose steroids. Work up negative for vasculitis or infectious process.  Patient comatose for prolonged period of time with minimal responsiveness despite resolution of status epilepticus and off sedation. Neurology felt patient with extremely poor prognosis for meaningful recovery.   On 05/23/13 patient with minimal motor response to noxious stimulation and family elected on Trach and PEG placement. Was started on Levaquin for purulent  tracheobronchitis.  SNF search underway at current time.  PT/ST evaluation done yesterday and patient started on PMSV trials. Patient profoundly weak and lethargic. MD recommending CIR to decrease burden of care.   The patient is able to ask husband to wash  her face. She does follow simple commands.  Review of Systems  Unable to perform ROS: other  Eyes: Positive for double vision (resolved few months ago. ).  Musculoskeletal:       Gait disorder.   Psychiatric/Behavioral: Positive for memory loss.   Past Medical History  Diagnosis Date  . Stroke   . Diabetes mellitus without complication   . Hypertension   . CHF (congestive heart failure)    History reviewed. No pertinent past surgical history.  History reviewed. No pertinent family history.  Social History: Married. Needed assist for transfers and ambulation due to staggering gait. 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 Husband disabled due to back problems. Used cane/furniture walked at home. Needed assist with ADLs. Per reports that she has never smoked. She has never used smokeless tobacco. Does not use alcohol or drug.    Allergies  Allergen Reactions  . Penicillins Nausea And Vomiting   Medications Prior to Admission  Medication Sig Dispense Refill  . albuterol (PROVENTIL HFA;VENTOLIN HFA) 108 (90 BASE) MCG/ACT inhaler Inhale 2 puffs into the lungs every 6 (six) hours as needed for wheezing.      Marland Kitchen albuterol (PROVENTIL) (2.5 MG/3ML) 0.083% nebulizer solution Take 2.5 mg by nebulization every 6 (six) hours as needed for wheezing.      Marland Kitchen aspirin EC 81 MG tablet Take 162 mg by mouth 2 (two) times daily.      . Aspirin-Acetaminophen-Caffeine (EXCEDRIN MIGRAINE PO) Take 1 tablet by mouth daily as needed. For headache      . buPROPion (WELLBUTRIN) 75 MG tablet Take 75 mg by mouth daily.      Marland Kitchen  candesartan (ATACAND) 32 MG tablet Take 16 mg by mouth 2 (two) times daily.      . Choline Fenofibrate (TRILIPIX) 135 MG capsule Take 135 mg by mouth daily.      . clopidogrel (PLAVIX) 75 MG tablet Take 75 mg by mouth daily.      . fluticasone (FLONASE) 50 MCG/ACT nasal spray Place 2 sprays into the  nose daily.      . furosemide (LASIX) 40 MG tablet Take 80 mg by mouth 2 (two) times daily.      . metoprolol succinate (TOPROL-XL) 100 MG 24 hr tablet Take 100 mg by mouth daily. Take with or immediately following a meal.      . omeprazole (PRILOSEC) 40 MG capsule Take 40 mg by mouth daily.      . promethazine (PHENERGAN) 25 MG tablet Take 25 mg by mouth every 8 (eight) hours as needed for nausea.      Marland Kitchen SALINE NA Place 1 spray into the nose 2 (two) times daily as needed. For nasal congestion      . spironolactone (ALDACTONE) 25 MG tablet Take 25 mg by mouth daily.        Home: Home Living Family/patient expects to be discharged to:: Private residence Living Arrangements: Spouse/significant other;Other relatives Available Help at Discharge: Family;Available 24 hours/day Type of Home: House Home Access: Ramped entrance Home Layout: One level (inch drop to bathroom and to living room) Home Equipment: Electric scooter;Bedside commode;Walker - 2 wheels;Walker - 4 wheels;Cane - single point;Hand held shower head (HH to trade to power chair for pt/ handicapped commode) Additional Comments: has 2 bedside commodes and puts one in room and one in shower  Functional History:   Functional Status:  Mobility: Bed Mobility Bed Mobility: Rolling Left;Left Sidelying to Sit;Sitting - Scoot to Edge of Bed Rolling Left: 1: +2 Total assist Rolling Left: Patient Percentage: 0% Left Sidelying to Sit: 1: +2 Total assist;HOB elevated Left Sidelying to Sit: Patient Percentage: 0% Sitting - Scoot to Edge of Bed: 1: +2 Total assist Sitting - Scoot to Edge of Bed: Patient Percentage: 0% Transfers Transfers: Not assessed Ambulation/Gait Ambulation/Gait Assistance: Not tested (comment) Stairs: No Wheelchair Mobility Wheelchair Mobility: No  ADL:    Cognition: Cognition Overall Cognitive Status: History of cognitive impairments - at baseline Orientation Level: Oriented to  person Cognition Arousal/Alertness: Awake/alert Behavior During Therapy: Flat affect Overall Cognitive Status: History of cognitive impairments - at baseline Area of Impairment: Orientation;Attention;Following commands;Memory;Safety/judgement;Awareness;Problem solving Orientation Level: Disoriented to;Situation;Time Current Attention Level: Sustained Memory: Decreased short-term memory Following Commands: Follows one step commands with increased time Safety/Judgement: Decreased awareness of deficits Problem Solving: Slow processing;Decreased initiation;Difficulty sequencing;Requires verbal cues;Requires tactile cues General Comments: Pt with impaired cognition however was able to follow come commands.    Blood pressure 150/57, pulse 72, temperature 98.7 F (37.1 C), temperature source Axillary, resp. rate 17, height 6' (1.829 m), weight 124.2 kg (273 lb 13 oz), SpO2 100.00%.   Physical Exam  Nursing note and vitals reviewed. Constitutional: She appears well-developed and well-nourished. She appears lethargic.  HENT:  Head: Normocephalic and atraumatic.  Cardiovascular: Normal rate and regular rhythm.   Pulmonary/Chest: Effort normal and breath sounds normal. No respiratory distress.  Abdominal: Soft. Bowel sounds are normal. She exhibits no distension.  Musculoskeletal:  Bilateral hands with 2+ edema. LLE with emerging tone?  Neurological: She appears lethargic.  Arouses briefly to sternal rubs. Left gaze preference. Diffuse weakness. Does not respond to pain.   Skin: Skin is  warm and dry.   oriented to person, place, situation, time Severe dysarthria using a Passy-Muir valve Motor strength is 2 minus/5 in the right deltoid, bicep, tricep, grip 1/5 in the left grip otherwise 0 in the left upper extremity In the right lower extremity 1/5 hip knee extensor synergy otherwise 00/5 in the left lower extremity Sensation difficult to assess secondary to communication however does not  grimace to pinch in any of 4 extremities. Husband notes the patient has severe diabetic neuropathy  Results for orders placed during the hospital encounter of 05/02/13 (from the past 24 hour(s))  GLUCOSE, CAPILLARY     Status: Abnormal   Collection Time    06/01/13  3:52 PM      Result Value Range   Glucose-Capillary 161 (*) 70 - 99 mg/dL  GLUCOSE, CAPILLARY     Status: Abnormal   Collection Time    06/01/13  8:05 PM      Result Value Range   Glucose-Capillary 169 (*) 70 - 99 mg/dL   Comment 1 Notify RN     Comment 2 Documented in Chart    GLUCOSE, CAPILLARY     Status: Abnormal   Collection Time    06/01/13 11:57 PM      Result Value Range   Glucose-Capillary 195 (*) 70 - 99 mg/dL   Comment 1 Notify RN     Comment 2 Documented in Chart    GLUCOSE, CAPILLARY     Status: Abnormal   Collection Time    06/02/13  4:10 AM      Result Value Range   Glucose-Capillary 212 (*) 70 - 99 mg/dL   Comment 1 Notify RN     Comment 2 Documented in Chart    GLUCOSE, CAPILLARY     Status: Abnormal   Collection Time    06/02/13  7:44 AM      Result Value Range   Glucose-Capillary 192 (*) 70 - 99 mg/dL   Comment 1 Documented in Chart     Comment 2 Notify RN     Dg Chest Port 1 View  06/01/2013   *RADIOLOGY REPORT*  Clinical Data: Respiratory failure  PORTABLE CHEST - 1 VIEW  Comparison: May 28, 2013  Findings: Tube and catheter positions are unchanged.  No pneumothorax.  There is patchy atelectasis in the bases, stable. No new opacity.  Heart size is upper normal with normal pulmonary vascularity.  No adenopathy.  IMPRESSION: Patchy bibasilar atelectatic change.  No change in the tube and catheter positions.  No pneumothorax.   Original Report Authenticated By: Bretta Bang, M.D.    Assessment/Plan: Diagnosis: multiple acute infarcts right parietal and left basal ganglia and central pons causing weakness on the left side greater than right side. 1. Does the need for close, 24 hr/day  medical supervision in concert with the patient's rehab needs make it unreasonable for this patient to be served in a less intensive setting? Potentially 2. Co-Morbidities requiring supervision/potential complications: Severe dysphagia n.p.o. requiring PEG tube as well as tracheostomy., Post stroke seizure disorder, diabetic neuropathy 3. Due to bladder management, bowel management, safety, skin/wound care, disease management, medication administration, pain management and patient education, does the patient require 24 hr/day rehab nursing? Potentially 4. Does the patient require coordinated care of a physician, rehab nurse, PT (1-2 hrs/day, 5 days/week), OT (1-2 hrs/day, 5 days/week) and SLP (0.5-1 hrs/day, 5 days/week) to address physical and functional deficits in the context of the above medical diagnosis(es)? Potentially Addressing deficits in  the following areas: balance, endurance, locomotion, strength, transferring, bowel/bladder control, bathing, dressing, feeding, grooming, toileting, cognition, speech, language, swallowing and psychosocial support 5. Can the patient actively participate in an intensive therapy program of at least 3 hrs of therapy per day at least 5 days per week? Potentially 6. The potential for patient to make measurable gains while on inpatient rehab is fair 7. Anticipated functional outcomes upon discharge from inpatient rehab are mod to Max assist with mobility with PT, mod to Max assist with ADLs with OT, express basic needs on a consistent basis, initiate per os feeding with SLP. 8. Estimated rehab length of stay to reach the above functional goals is: 4 weeks 9. Does the patient have adequate social supports to accommodate these discharge functional goals? Potentially 10. Anticipated D/C setting: SNF 11. Anticipated post D/C treatments: SNF rehabilitation 12. Overall Rehab/Functional Prognosis: fair  RECOMMENDATIONS: This patient's condition is appropriate for  continued rehabilitative care in the following setting: CIR when tolerating sitting for at least one hour at a time totaling 3 hours per day Patient has agreed to participate in recommended program. Potentially Note that insurance prior authorization may be required for reimbursement for recommended care.  Comment: Reduce burden of  care admission    06/02/2013

## 2013-06-02 NOTE — Progress Notes (Signed)
CSW contacted [facility] [phone] for placement with LOG. Facility stated that they are not able to take on anymore trach Pt's due to safety concerns. Facility will contact CSW if anything changes.   CSW contacted [facility] [phone] for placement. Facility stated they will take another look at Pt information for possible placement with LOG.    CSW contacted [facility] for placement with LOG. Facility stated they are limiting the number of LOG contracts, however are reviewing referrals on a case-by-case basis for disposition. Facility stated they will look at Pt information and call CSW with bed offer if possible.   Leron Croak, LCSWA [facility] Emergency Dept.  [phone]

## 2013-06-02 NOTE — Care Management (Signed)
Regarding LTACH options for discharge checked with both [facility] for possible placement- Per [facility] they are unable to offer LTACH bed- per [facility] at this time they have no bed available for charity case (pt is self pay with medicaid pending) At this time LTACH is not an option for discharge and CSW will continue to assist in trying to find a SNF bed.

## 2013-06-02 NOTE — Progress Notes (Signed)
Physical Therapy Treatment Patient Details Name: Donyell Carrell MRN: [id number] DOB: 05-19-1956 Today's Date: 06/02/2013 Time: 4401-0272 PT Time Calculation (min): 33 min  PT Assessment / Plan / Recommendation  History of Present Illness   Pt admit with CVA. 7/6 intubated for airway protection during suspected seizure activity. Hypotensive post intubation requiring vasopressors. MRI confirmed small acute infarcts of the right parietal lobe. Pt was in a coma for most of stay.     PT Comments   Pt admitted with CVA and multiple complications. Pt currently with functional limitations due to weakness and poor endurance due to prolonged bedrest and CVA.   Pt will benefit from skilled PT to increase their independence and safety with mobility to allow discharge to the venue listed below.     Follow Up Recommendations  Supervision/Assistance - 24 hour;LTACH;SNF                 Equipment Recommendations  Other (comment) (TBA)    Recommendations for Other Services    Frequency Min 3X/week   Progress towards PT Goals Progress towards PT goals: Progressing toward goals  Plan Current plan remains appropriate    Precautions / Restrictions Precautions Precautions: Fall Restrictions Weight Bearing Restrictions: No   Pertinent Vitals/Pain VSS, No pain    Mobility  Bed Mobility Bed Mobility: Rolling Left;Left Sidelying to Sit;Sitting - Scoot to Edge of Bed Rolling Left: 1: +2 Total assist Rolling Left: Patient Percentage: 0% Left Sidelying to Sit: 1: +2 Total assist;HOB elevated Left Sidelying to Sit: Patient Percentage: 0% Sitting - Scoot to Edge of Bed: 1: +2 Total assist Sitting - Scoot to Edge of Bed: Patient Percentage: 0% Details for Bed Mobility Assistance: Pt unable to assist with bed mobility due to weakness.   Transfers Transfers: Not assessed Ambulation/Gait Ambulation/Gait Assistance: Not tested (comment) Stairs: No Wheelchair Mobility Wheelchair Mobility: No Modified  Rankin (Stroke Patients Only) Pre-Morbid Rankin Score: Moderately severe disability Modified Rankin: Severe disability    PT Goals (current goals can now be found in the care plan section) Acute Rehab PT Goals Patient Stated Goal: To go home per husband  Visit Information  Last PT Received On: 06/02/13 Assistance Needed: +2 PT/OT Co-Evaluation/Treatment: Yes    Subjective Data  Subjective: Answers yes/no questions appropriately.  Mouths words as well.   Patient Stated Goal: To go home per husband   Cognition  Cognition Arousal/Alertness: Awake/alert Behavior During Therapy: Flat affect Overall Cognitive Status: History of cognitive impairments - at baseline Area of Impairment: Orientation;Attention;Following commands;Memory;Safety/judgement;Awareness;Problem solving Orientation Level: Disoriented to;Situation;Time Current Attention Level: Sustained Memory: Decreased short-term memory Following Commands: Follows one step commands with increased time Safety/Judgement: Decreased awareness of deficits Problem Solving: Slow processing;Decreased initiation;Difficulty sequencing;Requires verbal cues;Requires tactile cues General Comments: Pt with impaired cognition however was able to follow come commands.      Balance  Balance Balance Assessed: Yes Static Sitting Balance Static Sitting - Balance Support: No upper extremity supported;Feet supported Static Sitting - Level of Assistance: 1: +2 Total assist;Patient percentage (comment) (pt =0%) Static Sitting - Comment/# of Minutes: Pt sat EOB x 10 minutes with pt unable to assist with postural stability and needing 100% assist.  Pt was able to track an object in front of her with maxcues to stay on the target in all directions.  Pt had 80% accuracy stating how many fingers PT was holding up.  Right eye does not track as well as the left.  Husband present and states that pt had problems with her  vision PTA and had adaptive glasses.   Appears that pt has a gaze stability issue.  Pt needed total assist to hold head up to neutral position but pt did try to turn head and nod head yes and no to command.  Right fingers moved to command as well as neck and knee flexion 1/5.  At 9 minutes of sitting, pt indicated "I am tired," therefore, pt assisted back to supine position as she indicated fatigue.    End of Session PT - End of Session Equipment Utilized During Treatment: Gait belt;Oxygen Activity Tolerance: Patient limited by fatigue;Patient limited by lethargy Patient left: in bed;with call bell/phone within reach;with family/visitor present Nurse Communication: Mobility status;Need for lift equipment        INGOLD,Meisha Salone 06/02/2013, 11:19 AM [facility] Acute [facility] [phone] (pager)

## 2013-06-02 NOTE — Evaluation (Signed)
Speech Language Pathology Evaluation Patient Details Name: Brenda Pope MRN: [id number] DOB: [birth year] Today's Date: 06/02/2013 Time: 4098-1191 SLP Time Calculation (min): 21 min  Problem List:  Patient Active Problem List   Diagnosis Date Noted  . Acute tracheobronchitis 05/31/2013  . Tracheostomy status 05/29/2013  . Coma 05/11/2013  . DM2 (diabetes mellitus, type 2) 05/11/2013  . Hyperglycemia 05/11/2013  . CVA (cerebral infarction) 05/02/2013  . Acute respiratory failure 05/02/2013  . Seizures 05/02/2013   Past Medical History:  Past Medical History  Diagnosis Date  . Stroke   . Diabetes mellitus without complication   . Hypertension   . CHF (congestive heart failure)    Past Surgical History: History reviewed. No pertinent past surgical history. HPI:  57 y/o with past medical history of CVAs/TIAs and diastolic heart failure, severe baseline hypertension with noncompliance, poorly controlled diabetes admitted to [facility] on 7/5 with acute dyspnea and suspected CHF exacerbation.  On the day of admission developed developed neurological findings consistent with acute CVA. 7/6 intubated for airway protection during suspected seizure activity.  Hypotensive post intubation requiring vasopressors. MRI confirmed small acute infarcts of the right parietal lobe. On 7/7 transferred to [facility] for further management.  7/6 intubated, 7/30 trach; 8/1 PEG.  MRI 7/19: The most confluent infarcts in the right posterior MCA and PCA territories do demonstrate persistent restricted diffusion, but less cytotoxic edema. Thereis trace petechial hemorrhage, but no malignant hemorrhagic transformation or associated mass effect. Slight extension of a central pontine lacunar infarct (pontine perforator artery territory) without mass effect or hemorrhage.   Currently with #6 Shiley, cuffless.  Assessment / Plan / Recommendation Clinical Impression  F/u for cognitive-linguistic eval and family ed re: PMSV.   Pt demonstrates preservation of short-term recall at basic level and adequate sustained attention.  With PMSV in place, she initiates communication to make needs known, request help, and convey feelings to her spouse.  Intelligibility issues are impacted primarily by impaired phonation, breath support, and resonance; articulation is adequate.  Higher level cognitive capacity to be assessed as participation improves.  With valve in place, pt spoke with two siblings on phone, recalled specific, new information about each, and inquired about their personal circumstances.  From a cognitive-communicative standpoint, pt with promising prognosis.    During session, pt's husband educated re: PMSV use, placement/removal, symptoms of intolerance, and care. When alert, pt may wear valve with full supervision from either husband or RN.        SLP Assessment  Patient needs continued Speech Language Pathology Services  PLEASE ALLOW USE OF PMSV UNDER THE SUPERVISION OF RN OR Brenda Pope    Follow Up Recommendations  Inpatient Rehab    Frequency and Duration min 3x week  2 weeks      SLP Goals  SLP Goals Potential to Achieve Goals: Good Progress/Goals/Alternative treatment plan discussed with pt/caregiver and they: Agree SLP Goal #1: Pt will selectively attend to target stimuli in quiet environment for ten minutes with 80% accuracy. SLP Goal #1 - Progress: Progressing toward goal SLP Goal #2: Pt will use alternative methods to gain attention/call staff when valve not in place 3x per session.  SLP Evaluation Prior Functioning  Cognitive/Linguistic Baseline: Within functional limits Type of Home: House  Lives With: Spouse Available Help at Discharge: Family;Available 24 hours/day   Cognition  Overall Cognitive Status: Impaired/Different from baseline Arousal/Alertness: Awake/alert Orientation Level: Oriented to person;Oriented to place Attention: Sustained Sustained Attention: Appears intact Memory:  Impaired Memory Impairment: Storage deficit;Retrieval  deficit Awareness: Impaired Awareness Impairment: Emergent impairment Problem Solving: Impaired Problem Solving Impairment: Verbal basic    Comprehension  Auditory Comprehension Overall Auditory Comprehension:  (tba as voicing allows) Yes/No Questions: Within Functional Limits Conversation: Simple Reading Comprehension Reading Status: Not tested    Expression Expression Primary Mode of Expression: Verbal Verbal Expression Overall Verbal Expression: Impaired Initiation: Impaired Automatic Speech: Name;Social Response;Counting;Day of week;Month of year Level of Generative/Spontaneous Verbalization: Sentence Repetition: No impairment Naming: Not tested Pragmatics: Impairment Impairments: Abnormal affect;Eye contact;Monotone Non-Verbal Means of Communication: Eye blink Written Expression Dominant Hand: Right Written Expression: Unable to assess (comment)   Oral / Motor Oral Motor/Sensory Function Overall Oral Motor/Sensory Function: Impaired (bilateral deficits CN VII, V, XII) Motor Speech Overall Motor Speech: Appears within functional limits for tasks assessed Respiration: Impaired Level of Impairment: Phrase Phonation: Aphonic;Low vocal intensity;Hoarse (requires PMSV) Resonance: Hypernasality Intelligibility: Intelligibility reduced Word: 25-49% accurate Phrase: 25-49% accurate Sentence: 25-49% accurate Motor Planning: Witnin functional limits   GO    Brenda Vanhandel L. Brenda Pope, Brenda Pope  Brenda Pope 06/02/2013, 4:33 PM

## 2013-06-02 NOTE — Evaluation (Signed)
Occupational Therapy Evaluation Patient Details Name: Quantia Grullon MRN: [id number] DOB: [birth year] Today's Date: 06/02/2013 Time: 0865-7846 OT Time Calculation (min): 33 min  OT Assessment / Plan / Recommendation History of present illness Pt admitted with CVA.  Intubated 7/6 for airway protection following suspected seizure activity. Hypotensive post intubation with vasopressors.  MRI revealed small acute infarcts R parietal lobe. Pt has been comatose most of this hospitalization.   Clinical Impression   Pt presents with no functional movement in her extremities or trunk due to complicated medical events and prolonged hospitalization and CVA.  Will follow acutely to address goal areas.  Pt will need extensive post acute rehab.    OT Assessment  Patient needs continued OT Services    Follow Up Recommendations  SNF (vs LTACH)    Barriers to Discharge      Equipment Recommendations  None recommended by OT    Recommendations for Other Services    Frequency  Min 2X/week    Precautions / Restrictions Precautions Precautions: Fall Precaution Comments: trach with 02 Restrictions Weight Bearing Restrictions: No   Pertinent Vitals/Pain No indicators of pain, VSS with pt on 28% 02 via trach    ADL  Eating/Feeding: NPO (PEG tube) ADL Comments: Pt requires total assist for all ADL.    OT Diagnosis: Generalized weakness;Cognitive deficits;Disturbance of vision;Hemiplegia dominant side  OT Problem List: Decreased activity tolerance;Obesity;Impaired tone;Decreased cognition;Impaired balance (sitting and/or standing);Decreased strength;Impaired UE functional use;Increased edema;Impaired vision/perception OT Treatment Interventions: Therapeutic activities;Patient/family education;Balance training;Cognitive remediation/compensation   OT Goals(Current goals can be found in the care plan section) Acute Rehab OT Goals Patient Stated Goal: husband is seeking post acute rehab and eventual return  home OT Goal Formulation: With family Time For Goal Achievement: 06/16/13 Potential to Achieve Goals: Fair ADL Goals Additional ADL Goal #1: Pt will sit EOB x 10 minutes with moderate assistance and head at midline. Additional ADL Goal #2: Pt will respond to questions and simple commands within 5 seconds of request.  Visit Information  Last OT Received On: 06/02/13 Assistance Needed: +2 PT/OT Co-Evaluation/Treatment: Yes History of Present Illness: Pt admitted with CVA.  Intubated 7/6 for airway protection following suspected seizure activity. Hypotensive post intubation with vasopressors.  MRI revealed small acute infarcts R parietal lobe. Pt has been comatose most of this hospitalization.       Prior Functioning     Home Living Family/patient expects to be discharged to:: Skilled nursing facility Living Arrangements: Spouse/significant other;Other relatives Available Help at Discharge: Family;Available 24 hours/day Type of Home: House Home Access: Ramped entrance Home Layout: One level Home Equipment: Art gallery manager;Bedside commode;Walker - 2 wheels;Walker - 4 wheels;Cane - single point;Hand held shower head Additional Comments: has 2 bedside commodes and puts one in room and one in shower Prior Function Level of Independence: Independent with assistive device(s);Needs assistance Gait / Transfers Assistance Needed: min guard assist transfers to scooter, min assist for ambulation iwth cane due to staggers ADL's / Homemaking Assistance Needed: Bathing and dressing min assist Communication / Swallowing Assistance Needed: Normal Communication Communication: Tracheostomy Dominant Hand: Right         Vision/Perception Vision - History Baseline Vision: Wears glasses all the time Visual History:  (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) Patient Visual Report:  (dysconjugate ocular movement) Vision - Assessment Vision Assessment: Vision impaired - to be  further tested in functional context   Cognition  Cognition Arousal/Alertness: Awake/alert Behavior During Therapy: Flat affect Overall Cognitive Status: Impaired/Different from baseline Area  of Impairment: Orientation;Attention;Following commands;Memory;Safety/judgement;Awareness;Problem solving Orientation Level: Disoriented to;Situation;Time Current Attention Level: Sustained Memory: Decreased short-term memory Following Commands: Follows one step commands with increased time Safety/Judgement: Decreased awareness of deficits Problem Solving: Slow processing;Decreased initiation;Difficulty sequencing;Requires verbal cues;Requires tactile cues General Comments: Pt with impaired cognition however was able to follow come commands.      Extremity/Trunk Assessment Upper Extremity Assessment Upper Extremity Assessment: RUE deficits/detail;LUE deficits/detail RUE Deficits / Details: No functional movement, full PROM, decreased tone LUE Deficits / Details: No functional movement, full PROM, decreased tone Lower Extremity Assessment RLE Sensation: history of peripheral neuropathy LLE Deficits / Details: grossly 0/5 LLE Sensation: history of peripheral neuropathy     Mobility Bed Mobility Bed Mobility: Rolling Left;Left Sidelying to Sit;Sitting - Scoot to Edge of Bed Rolling Left: 1: +2 Total assist Rolling Left: Patient Percentage: 0% Left Sidelying to Sit: 1: +2 Total assist;HOB elevated Left Sidelying to Sit: Patient Percentage: 0% Sitting - Scoot to Edge of Bed: 1: +2 Total assist Sitting - Scoot to Edge of Bed: Patient Percentage: 0% Details for Bed Mobility Assistance: Pt unable to assist with bed mobility due to weakness.       Exercise     Balance Balance Balance Assessed: Yes Static Sitting Balance Static Sitting - Balance Support: No upper extremity supported;Feet supported Static Sitting - Level of Assistance: 1: +2 Total assist;Patient percentage (comment) (pt  =0%) Static Sitting - Comment/# of Minutes: Pt sat EOB x 10 minutes with pt unable to assist with postural stability and needing 100% assist.  Pt was able to track an object in front of her with maxcues to stay on the target in all directions.  Pt had 80% accuracy stating how many fingers PT was holding up.  Right eye does not track as well as the left.  Husband present and states that pt had problems with her vision PTA and had adaptive glasses.  Appears that pt has a gaze stability issue.  Pt needed total assist to hold head up to neutral position but pt did try to turn head and nod head yes and no to command.  Right fingers moved to command as well as neck and knee flexion 1/5.  At 9 minutes of sitting, pt indicated "I am tired," therefore, pt assisted back to supine position as she indicated fatigue.     End of Session OT - End of Session Patient left: in bed;with call bell/phone within reach Nurse Communication: Mobility status  GO     Evern Bio 06/02/2013, 1:42 PM [phone]

## 2013-06-02 NOTE — Procedures (Signed)
Tracheostomy Change Note  Patient Details:   Name: Brenda Pope DOB: [birth year] MRN: [id number] Trach changed per MD order to #6 cuffless trrach without incident. Removed #6 cuffed shiley and sutures.  Some bleeding occurred when sutures removed and sit cleaned.   Co2 detector + for color change.  Pt tolerated well. obturator @ HOB.     Evaluation  O2 sats: stable throughout Complications: No apparent complications Patient did tolerate procedure well. Bilateral Breath Sounds: Diminished;Rhonchi Suctioning: Airway  Jerome Otter Apple 06/02/2013, 11:41 AM

## 2013-06-02 NOTE — Progress Notes (Signed)
Utilization review completed.  

## 2013-06-02 NOTE — Progress Notes (Signed)
CSW received message from [facility] CM concerning possible placement in a LTAC facility.   At this time [facility] will not be able to accept another [facility] at their facility due financial limitations and [facility] was also a no for placement.   CSW will continue to work on placement.   Leron Croak, LCSWA [facility] Emergency Dept.  [phone]

## 2013-06-03 LAB — GLUCOSE, CAPILLARY
Glucose-Capillary: 159 mg/dL — ABNORMAL HIGH (ref 70–99)
Glucose-Capillary: 187 mg/dL — ABNORMAL HIGH (ref 70–99)

## 2013-06-03 NOTE — Progress Notes (Signed)
Rehab admissions - Evaluated for possible admission.  Patient is not doing much with therapies at this time.  Will need extended rehab stay.  Noted social worker looking for SNF bed.  Patient will need SNF placement.  Call me for questions.  #[phone]

## 2013-06-03 NOTE — Progress Notes (Signed)
CSW contacted Dorien [facility]) [phone] from [facility] [place].   Facility will review Pt information and consider placement.   CSW to follow for d/c planning.   Leron Croak, LCSWA [facility] Emergency Dept.  [phone]

## 2013-06-03 NOTE — Progress Notes (Signed)
PULMONARY  / CRITICAL CARE MEDICINE  Name: Brenda Pope MRN: [id number] DOB: 03-29-1956    ADMISSION DATE:  05/02/2013 CONSULTATION DATE:  7/7/104  REFERRING MD :  Transfer from [facility] PRIMARY SERVICE:  PCCM  CHIEF COMPLAINT:  Status epilepticus  BRIEF PATIENT DESCRIPTION: 57 y/o with past medical history of CVAs/TIAs and diastolic heart failure, severe baseline hypertension with noncompliance, poorly controlled diabetes admitted to [facility] on 7/5 with acute dyspnea and suspected CHF exacerbation.  On the day of admission developed developed neurological findings consistent with acute CVA. 7/6 intubated for airway protection during suspected seizure activity.  Hypotensive post intubation requiring vasopressors. MRI confirmed small acute infarcts of the right parietal lobe. On 7/7 transferred to [facility] for further management.  SIGNIFICANT EVENTS / STUDIES:  7/05 -  [facility] to  [facility] with CHF exacerbation; stroke symptoms 7/06 - Suspected seizure activity, intubated for airway protection; hypotensive post intubation requiring vasopressors 7/06 -  Brain MRI:  Multiple small acute infarcts R parietal lobe, possible small infarcts left basal ganglia and central pons 7/06 - TTE:  No source of CVA, EF 55-60% 7/06 - Carotid Doppler:  No evidence of hemodynamically significant stenosis 7/07 - Transferred to [facility], PCCM serivce 7/07 - Neuro consult: "Concern patient is on SE in the context of recent cortical stroke." 7/08 - MRI brain: Confluent and scattered right MCA and right PCA acute infarcts, plus scattered small left hemisphere and occasional posterior fossa acute infarcts. The vast majority of these infarcts are newly seen since 05/01/2013.  This appearance suggests embolic phenomena which severely affected more proximal/medium-sized vessels of the right MCA and PCA territories. Cytotoxic edema without significant mass effect at this time. No associated hemorrhage 7/08 -  EEG: severe diffuse encephalopathic process with asymmetrical involvement. Findings reported from the left hemisphere were nonspecific. Findings record from the right hemisphere were consistent with acute large area of infarction. No frank epileptiform activity was recorded. 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  7/10 - LP performed by Neuro:  7/10 - Burst suppression with propofol. Norepi initiated to prevent propofol induced hypotension. Consideration of primary CNS vasculitis. Serologies ordered>> CRP, ESR markedly elevated. High dose methylpred ordered. Insulin gtt ordered while on high dose steriods 7/10 - propofol ineffective at burst suppression. pentobarbitol initiated 7/11 - CT head: Right frontal lobe, right parietal lobe, right occipital lobe and posterior right temporal lobe acute/subacute infarcts. Surrounding edema with local mass effect. Minimal petechial hemorrhage suspected particularly along the superior margin of the infarct. No gross hemorrhage noted. No midline shift 7/11 - angiogram eval for vasculitis: smooth focal areas of narrowing of prox PCAs and distal pericallosal. Subcortical branches, suspicious of vasculitis. 7/14 - TEE: no evidence of thrombus 7/20 - MRI brain: Overall mildly regressed signal abnormality in the brain associated with multi focal infarcts. The most confluent infarcts in the right posterior MCA and PCA territories do demonstrate persistent restricted diffusion, but less cytotoxic edema. There is trace petechial hemorrhage, but no malignant hemorrhagic transformation or associated mass effect. Slight extension of a central pontine lacunar infarct (pontine perforator artery territory) without mass effect or hemorrhage 7/21 - EEG: severe encephalopathy, nonspecific as to cause. No electrography seizures  noted.  7/22 - Family indicated that they did not wish to pursue trach/G-tube and SNF. However, they were not ready to "give up" yet citing their concern  that all of the meds used to induced coma might not have fully washed out of her system yet. Noted that she had developed severe hypernatremia. Free water increased. Made DNR in event of cardiac arrest 7/28 - pt following commands, family is now considering Trach and rehab for patient. Husband to decide in the coming days.   7/30 - Trach completed by Dr. Molli Knock 7/31 - Family ok with placement of Gastric tube per IR 8/01 - G tube placed by IR 8/05 - More alert. + F/C. Transferred to SDU  LINES / TUBES: L radial A-line 7/10 >> 7/12 R IJ CVL 7/6 >> 7/21 ETT 7/6 >> 7/30 Trach (JY) 7/30  >> 8/6 cuffless 6 >> G tube (IR) 8/1 >>  RUE PICC 8/1 >>   CULTURES: MRSA PCR 7/07 >> POS Blood culture >> NEG CSF 7/10 >> NEG C diff 8/02 >> NEG Resp 8/05 >> oral flora   ANTIBIOTICS: Levoflox 8/05 >>   SUBJ:  afebrile Denies pain   VITAL SIGNS: Temp:  [97.7 F (36.5 C)-99.5 F (37.5 C)] 98.3 F (36.8 C) (08/08 1221) Pulse Rate:  [70-77] 71 (08/08 1216) Resp:  [14-49] 19 (08/08 1216) BP: (116-170)/(59-87) 156/61 mmHg (08/08 1126) SpO2:  [95 %-100 %] 99 % (08/08 1216) FiO2 (%):  [28 %] 28 % (08/08 1216)  HEMODYNAMICS:   VENTILATOR SETTINGS: Vent Mode:  [-]  FiO2 (%):  [28 %] 28 %  INTAKE / OUTPUT: Intake/Output     08/07 0701 - 08/08 0700 08/08 0701 - 08/09 0700   I.V. (mL/kg)  10 (0.1)   Other     NG/GT 1570    Total Intake(mL/kg) 1570 (12.6) 10 (0.1)   Net +1570 +10        Urine Occurrence 8 x 2 x   Stool Occurrence 6 x      PHYSICAL EXAMINATION: General: RASS 0, NAD Neuro: + F/C, MAEs, diffusely weak HEENT: PERRL, EOMI Cardiovascular:  RRR s M Lungs:decreased trach secretions, no rhonchi Abdomen:  Soft, nontender, bowel sounds diminished Ext: warm, trace edema all 4 ext  LABS: BMET  Recent Labs Lab  05/28/13 0415 06/01/13 0420  NA 146* 143  K 3.9 3.6  CL 110 106  CO2 30 33*  GLUCOSE 141* 161*  BUN 25* 23  CREATININE 0.46* 0.52  CALCIUM 8.7 8.5   CBC  Recent Labs Lab 05/28/13 0415 06/01/13 0420  HGB 8.2* 7.5*  HCT 25.7* 22.8*  WBC 5.6 6.8  PLT 260 295   CXR:  NNF  ASSESSMENT / PLAN:  PULMONARY A:   Acute respiratory failure due to AMS Tracheostomy status P:   Changed to 6 cuffless, progress PMV when stronger  CARDIOVASCULAR  A:  Propofol/pentobarb induced hypotension, resolved H/O CHF, compensated Chronic hypertension P:  Cont lopressor  RENAL A:   Acute on CRI, resolved Hypernatremia P:   Monitor BMET intermittently Correct electrolytes and free water deficit as needed.  GASTROINTESTINAL A:  No issues P:   Cont TFs SUP not indicated presently  HEMATOLOGIC A:  Mild anemia, without acute blood loss P:  Monitor CBC intermittently  Minimize phlebotomy Cont LMWH for DVT prophy  INFECTIOUS A:  Purulent tracheobronchitis (doubt PNA in absence of fever or resp distress) P:   levaquin x 7ds total until 8/11   ENDOCRINE  A:  DM2. Hyperglycemia improved on Glucerna Diabetic Neuropathy P:   Cont lantus, SSI.    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 Aggressive PT/OT  Advance PMV   Plan for SNF, not acceptable to LTAC Will need trach clinic f/u in 6 wks after discharge Updated husband 8/7 Have asked [facility] to assume care 8/9  Cyril Mourning MD. [facility] [place] Pulmonary & Critical care Pager [phone] If no response call [phone]

## 2013-06-03 NOTE — Progress Notes (Signed)
CSW called [facility] this a.m. for possible placement. Ceasar Mons currently not available. CSW left a message for request her to call CSW back for placement consideration for Pt with LOG.   CSW awaiting a callback for possible placement.    Leron Croak, LCSWA [facility] Emergency Dept.  [phone]

## 2013-06-04 LAB — BASIC METABOLIC PANEL
BUN: 20 mg/dL (ref 6–23)
CO2: 34 mEq/L — ABNORMAL HIGH (ref 19–32)
Chloride: 99 mEq/L (ref 96–112)
Creatinine, Ser: 0.45 mg/dL — ABNORMAL LOW (ref 0.50–1.10)
GFR calc Af Amer: >90 mL/min (ref >90)
Glucose, Bld: 170 mg/dL — ABNORMAL HIGH (ref 70–99)
Potassium: 4.2 mEq/L (ref 3.5–5.1)

## 2013-06-04 LAB — GLUCOSE, CAPILLARY
Glucose-Capillary: 154 mg/dL — ABNORMAL HIGH (ref 70–99)
Glucose-Capillary: 155 mg/dL — ABNORMAL HIGH (ref 70–99)
Glucose-Capillary: 167 mg/dL — ABNORMAL HIGH (ref 70–99)
Glucose-Capillary: 177 mg/dL — ABNORMAL HIGH (ref 70–99)

## 2013-06-04 NOTE — Progress Notes (Signed)
[facility] Progress Note [place] TEAM 1 - Stepdown/ICU TEAM   Lana Flaim YQM:[id number] DOB: 1956-08-08 DOA: 05/02/2013 PCP: Bobbye Riggs, NP  Brief narrative: 57 y/o with past medical history of CVAs/TIAs and diastolic heart failure, severe baseline hypertension with noncompliance, poorly controlled diabetes who was admitted to [facility] on 7/5 with acute dyspnea and suspected CHF exacerbation. On the day of admission she developed neurological findings consistent with acute CVA. 7/6 she required intubated for airway protection during suspected seizure activity. She became hypotensive post intubation requiring vasopressors. MRI confirmed small acute infarcts of the right parietal lobe. On 7/7 she was transferred to [facility] for further management per the PCCM service.   SIGNIFICANT EVENTS / STUDIES:  7/05 - Admitted to [facility] with CHF exacerbation; developed stroke symptoms  7/06 - Suspected seizure activity, intubated for airway protection; hypotensive post intubation requiring vasopressors  7/06 - Brain MRI: Multiple small acute infarcts R parietal lobe, possible small infarcts left basal ganglia and central pons  7/06 - TTE: No source of thrombus/CVA, EF 55-60%  7/06 - Carotid Doppler: No evidence of hemodynamically significant stenosis  7/07 - Transferred to [facility] - PCCM serivce  7/07 - Neuro consult: "Concern patient is in SE in the context of recent cortical stroke."  7/08 - MRI brain: Confluent and scattered right MCA and right PCA acute infarcts, plus scattered small left hemisphere and occasional posterior fossa acute infarcts. The vast majority of these infarcts are newly seen since 05/01/2013.  This appearance suggests embolic phenomena which severely affected more proximal/medium-sized vessels of the right MCA and PCA territories. Cytotoxic edema without significant mass effect at this time. No associated hemorrhage  7/08 - EEG: severe diffuse encephalopathic process  with asymmetrical involvement. Findings reported from the left hemisphere were nonspecific. Findings record from the right hemisphere were consistent with acute large area of infarction. No frank epileptiform activity was recorded. PLEDs not considered epileptic, most often seen with acute large hemispheric strokes  7/09 - EEG: consistent with recurrent seizures which amount to partial status epilepticus in the right hemisphere.  7/10 - LP performed by Neuro 7/10 - Burst suppression with propofol. Norepi initiated to prevent propofol induced hypotension. Consideration of primary CNS vasculitis. Serologies ordered>> CRP, ESR markedly elevated. High dose methylpred ordered. Insulin gtt ordered while on high dose steriods  7/10 - propofol ineffective at burst suppression. pentobarbitol initiated  7/11 - CT head: Right frontal lobe, right parietal lobe, right occipital lobe and posterior right temporal lobe acute/subacute infarcts. Surrounding edema with local mass effect. Minimal petechial hemorrhage suspected particularly along the superior margin of the infarct. No gross hemorrhage noted. No midline shift 7/11 - angiogram eval for vasculitis: smooth focal areas of narrowing of prox PCAs and distal pericallosal. Subcortical branches, suspicious of vasculitis.  7/14 - TEE: no evidence of thrombus  7/20 - MRI brain: Overall mildly regressed signal abnormality in the brain associated with multi focal infarcts. The most confluent infarcts in the right posterior MCA and PCA territories do demonstrate persistent restricted diffusion, but less cytotoxic edema. There is trace petechial hemorrhage, but no malignant hemorrhagic transformation or associated mass effect. Slight extension of a central pontine lacunar infarct (pontine perforator artery territory) without mass effect or hemorrhage  7/21 - EEG: severe encephalopathy, nonspecific as to cause. No electrography seizures noted.  7/22 - Family indicated that  they did not wish to pursue trach/G-tube and SNF. However, they were not ready to "give up" yet citing their concern that all  of the meds used to induced coma might not have fully washed out of her system yet. Noted that she had developed severe hypernatremia. Free water increased. Made DNR in event of cardiac arrest  7/28 - pt following commands, family is now considering Trach and rehab for patient. Husband to decide in the coming days.  7/30 - Trach completed by Dr. Molli Knock  7/31 - Family ok with placement of Gastric tube per IR  8/01 - G tube placed by IR  8/05 - More alert - Transferred to SDU  8/9 - awaiting placement - care transitioned to [facility]  Assessment/Plan:  Acute CVA in multiple territories, unclear etiology Concern for CNS vasculitis but no supportive evidence found after workup as per Neuro - received pulse dose steroids - now off steroids - as per Neuro "Though there was initial concern for Lyme antibodies, confirmatory western blot did not confirm. Other than mildly elevated protein, CSF was normal. Her angiogram did show some focal areas of narrowing, but these were very isolated and I feel would be more consistent with atherosclerosis then vasculitis." - no further studies to offer at this time  Status epilepticus Resolved - cont triple AEDs for now  Persistent coma resolved   Acute respiratory failure due to AMS - s/p tracheostomy per PCCM Changed to 6 cuffless per PCCM - progress PMV when stronger - will need trach clinic f/u in 6 wks after discharge  HTN Poorly controlled, but wish to avoid overcorrection so will follow w/o change in tx plan today   Acute on chronic renal failure Renal function has now returned to normal with GFR >90  Hypernatremia Resolved   Mild anemia, without acute blood loss Recheck in AM  Purulent tracheobronchitis  doubt PNA in absence of fever or resp distress - levaquin x 7ds  DM2  Reasonably well controlled at this time  Code  Status: NO CODE BLUE Family Communication: spoke w/ multiple family members at bedside Disposition Plan: SDU - awaiting SNF placement (difficult as pt self pay) - denied by LTAC due to self-pay status  Consultants: PCCM >> [facility] Neurology -signed off  Procedures: L radial A-line 7/10 >> 7/12  R IJ CVL 7/6 >> 7/21  ETT 7/6 >> 7/30  Trach (JY) 7/30 >> 8/6 cuffless #6 >>  G tube (IR) 8/1 >>  RUE PICC 8/1 >>   Antibiotics: Levoflox 8/05 >> 8/11  DVT prophylaxis: lovenox  HPI/Subjective: Pt is alert and able to answer questions.  Denies cp, sob, n/v, or abdom pain.  Objective: Blood pressure 169/70, pulse 77, temperature 98.3 F (36.8 C), temperature source Axillary, resp. rate 0, height 6' (1.829 m), weight 124.2 kg (273 lb 13 oz), SpO2 97.00%.  Intake/Output Summary (Last 24 hours) at 06/04/13 1251 Last data filed at 06/04/13 1200  Gross per 24 hour  Intake    720 ml  Output      0 ml  Net    720 ml    Exam: General: No acute respiratory distress Lungs: Clear to auscultation bilaterally without wheezes or crackles with exception to mild bibasilar crackles Cardiovascular: distant hs - regular rate and rhythm without murmur gallop or rub normal S1 and S2 Abdomen: obese - nontender, nondistended, soft, bowel sounds positive, no rebound, no ascites, no appreciable mass Extremities: No significant cyanosis, clubbing;  Trace edema bilateral lower extremities  Data Reviewed: Basic Metabolic Panel:  Recent Labs Lab 06/01/13 0420 06/04/13 0400  NA 143 137  K 3.6 4.2  CL 106  99  CO2 33* 34*  GLUCOSE 161* 170*  BUN 23 20  CREATININE 0.52 0.45*  CALCIUM 8.5 8.3*   Liver Function Tests: No results found for this basename: AST, ALT, ALKPHOS, BILITOT, PROT, ALBUMIN,  in the last 168 hours  CBC:  Recent Labs Lab 06/01/13 0420  WBC 6.8  HGB 7.5*  HCT 22.8*  MCV 91.2  PLT 295   CBG:  Recent Labs Lab 06/03/13 2000 06/03/13 2343 06/04/13 0405 06/04/13 0755  06/04/13 1143  GLUCAP 150* 177* 154* 155* 183*    Recent Results (from the past 240 hour(s))  CLOSTRIDIUM DIFFICILE BY PCR     Status: None   Collection Time    05/28/13  3:22 PM      Result Value Range Status   C difficile by pcr NEGATIVE  NEGATIVE Final  CULTURE, RESPIRATORY (NON-EXPECTORATED)     Status: None   Collection Time    05/31/13 12:56 PM      Result Value Range Status   Specimen Description TRACHEAL ASPIRATE   Final   Special Requests NONE   Final   Gram Stain     Final   Value: FEW WBC PRESENT,BOTH PMN AND MONONUCLEAR     NO SQUAMOUS EPITHELIAL CELLS SEEN     FEW GRAM POSITIVE COCCI     IN PAIRS FEW GRAM NEGATIVE RODS     RARE GRAM POSITIVE RODS   Culture     Final   Value: Non-Pathogenic Oropharyngeal-type Flora Isolated.     Performed at [facility]   Report Status 06/02/2013 FINAL   Final  MRSA PCR SCREENING     Status: None   Collection Time    06/02/13  5:10 PM      Result Value Range Status   MRSA by PCR NEGATIVE  NEGATIVE Final   Comment:            The GeneXpert MRSA Assay (FDA     approved for NASAL specimens     only), is one component of a     comprehensive MRSA colonization     surveillance program. It is not     intended to diagnose MRSA     infection nor to guide or     monitor treatment for     MRSA infections.     Studies:  Recent x-ray studies have been reviewed in detail by the Attending Physician  Scheduled Meds:  Scheduled Meds: . antiseptic oral rinse  1 application Mouth Rinse QID  . aspirin  325 mg Per Tube Daily  . chlorhexidine  15 mL Mouth/Throat BID  . enoxaparin (LOVENOX) injection  40 mg Subcutaneous Q24H  . feeding supplement  30 mL Per Tube Daily  . free water  200 mL Per Tube Q6H  . insulin aspart  0-20 Units Subcutaneous Q4H  . insulin glargine  40 Units Subcutaneous QHS  . lacosamide  200 mg Per Tube BID  . levETIRAcetam  1,000 mg Per Tube BID  . levofloxacin  500 mg Per Tube Daily  . metoprolol  tartrate  25 mg Per Tube BID  . phenytoin  150 mg Per Tube TID  . sodium chloride  10-40 mL Intracatheter Q12H   Time spent on care of this patient: 35+ mins   [facility] T  [facility] Office  [phone] Pager - Text Page per Loretha Stapler as per below:  On-Call/Text Page:      [email or web address]      password [email or web address]  If  7PM-7AM, please contact night-coverage [email or web address] Password [email or web address] 06/04/2013, 12:51 PM   LOS: 33 days

## 2013-06-05 LAB — CBC
HCT: 22.2 % — ABNORMAL LOW (ref 36.0–46.0)
MCH: 30.1 pg (ref 26.0–34.0)
MCV: 90.2 fL (ref 78.0–100.0)
RBC: 2.46 MIL/uL — ABNORMAL LOW (ref 3.87–5.11)
WBC: 7.5 10*3/uL (ref 4.0–10.5)

## 2013-06-05 LAB — GLUCOSE, CAPILLARY
Glucose-Capillary: 131 mg/dL — ABNORMAL HIGH (ref 70–99)
Glucose-Capillary: 148 mg/dL — ABNORMAL HIGH (ref 70–99)
Glucose-Capillary: 157 mg/dL — ABNORMAL HIGH (ref 70–99)

## 2013-06-05 LAB — BASIC METABOLIC PANEL
CO2: 34 mEq/L — ABNORMAL HIGH (ref 19–32)
Chloride: 97 mEq/L (ref 96–112)
Creatinine, Ser: 0.44 mg/dL — ABNORMAL LOW (ref 0.50–1.10)
Glucose, Bld: 154 mg/dL — ABNORMAL HIGH (ref 70–99)
Sodium: 135 mEq/L (ref 135–145)

## 2013-06-05 MED ORDER — ALTEPLASE 2 MG IJ SOLR
2.0000 mg | Freq: Once | INTRAMUSCULAR | Status: AC
  Administered 2013-06-05: 2 mg
  Filled 2013-06-05: qty 2

## 2013-06-05 MED ORDER — IRBESARTAN 150 MG PO TABS
150.0000 mg | ORAL_TABLET | Freq: Every day | ORAL | Status: DC
  Administered 2013-06-05 – 2013-06-06 (×2): 150 mg
  Filled 2013-06-05 (×3): qty 1

## 2013-06-05 MED ORDER — FREE WATER
100.0000 mL | Freq: Three times a day (TID) | Status: DC
  Administered 2013-06-05 – 2013-06-15 (×28): 100 mL

## 2013-06-05 MED ORDER — SPIRONOLACTONE 25 MG PO TABS
25.0000 mg | ORAL_TABLET | Freq: Every day | ORAL | Status: DC
  Administered 2013-06-05 – 2013-06-17 (×13): 25 mg
  Filled 2013-06-05 (×13): qty 1

## 2013-06-05 NOTE — Progress Notes (Signed)
[facility] Progress Note [place] TEAM 1 - Stepdown/ICU TEAM   Brenda Pope RUE:[id number] DOB: 01/19/1956 DOA: 05/02/2013 PCP: Bobbye Riggs, NP  Brief narrative: 57 y/o with past medical history of CVAs/TIAs and diastolic heart failure, severe baseline hypertension with noncompliance, poorly controlled diabetes who was admitted to [facility] on 7/5 with acute dyspnea and suspected CHF exacerbation. On the day of admission she developed neurological findings consistent with acute CVA. 7/6 she required intubated for airway protection during suspected seizure activity. She became hypotensive post intubation requiring vasopressors. MRI confirmed small acute infarcts of the right parietal lobe. On 7/7 she was transferred to [facility] for further management per the PCCM service.   SIGNIFICANT EVENTS / STUDIES:  7/05 - Admitted to [facility] with CHF exacerbation; developed stroke symptoms  7/06 - Suspected seizure activity, intubated for airway protection; hypotensive post intubation requiring vasopressors  7/06 - Brain MRI: Multiple small acute infarcts R parietal lobe, possible small infarcts left basal ganglia and central pons  7/06 - TTE: No source of thrombus/CVA, EF 55-60%  7/06 - Carotid Doppler: No evidence of hemodynamically significant stenosis  7/07 - Transferred to [facility] - PCCM serivce  7/07 - Neuro consult: "Concern patient is in SE in the context of recent cortical stroke."  7/08 - MRI brain: Confluent and scattered right MCA and right PCA acute infarcts, plus scattered small left hemisphere and occasional posterior fossa acute infarcts. The vast majority of these infarcts are newly seen since 05/01/2013.  This appearance suggests embolic phenomena which severely affected more proximal/medium-sized vessels of the right MCA and PCA territories. Cytotoxic edema without significant mass effect at this time. No associated hemorrhage  7/08 - EEG: severe diffuse encephalopathic process  with asymmetrical involvement. Findings reported from the left hemisphere were nonspecific. Findings record from the right hemisphere were consistent with acute large area of infarction. No frank epileptiform activity was recorded. PLEDs not considered epileptic, most often seen with acute large hemispheric strokes  7/09 - EEG: consistent with recurrent seizures which amount to partial status epilepticus in the right hemisphere.  7/10 - LP performed by Neuro 7/10 - Burst suppression with propofol. Norepi initiated to prevent propofol induced hypotension. Consideration of primary CNS vasculitis. Serologies ordered>> CRP, ESR markedly elevated. High dose methylpred ordered. Insulin gtt ordered while on high dose steriods  7/10 - propofol ineffective at burst suppression. pentobarbitol initiated  7/11 - CT head: Right frontal lobe, right parietal lobe, right occipital lobe and posterior right temporal lobe acute/subacute infarcts. Surrounding edema with local mass effect. Minimal petechial hemorrhage suspected particularly along the superior margin of the infarct. No gross hemorrhage noted. No midline shift 7/11 - angiogram eval for vasculitis: smooth focal areas of narrowing of prox PCAs and distal pericallosal. Subcortical branches, suspicious of vasculitis.  7/14 - TEE: no evidence of thrombus  7/20 - MRI brain: Overall mildly regressed signal abnormality in the brain associated with multi focal infarcts. The most confluent infarcts in the right posterior MCA and PCA territories do demonstrate persistent restricted diffusion, but less cytotoxic edema. There is trace petechial hemorrhage, but no malignant hemorrhagic transformation or associated mass effect. Slight extension of a central pontine lacunar infarct (pontine perforator artery territory) without mass effect or hemorrhage  7/21 - EEG: severe encephalopathy, nonspecific as to cause. No electrography seizures noted.  7/22 - Family indicated that  they did not wish to pursue trach/G-tube and SNF. However, they were not ready to "give up" yet citing their concern that all  of the meds used to induced coma might not have fully washed out of her system yet. Noted that she had developed severe hypernatremia. Free water increased. Made DNR in event of cardiac arrest  7/28 - pt following commands, family is now considering Trach and rehab for patient. Husband to decide in the coming days.  7/30 - Trach completed by Dr. Molli Knock  7/31 - Family ok with placement of Gastric tube per IR  8/01 - G tube placed by IR  8/05 - More alert - Transferred to SDU  8/9 - awaiting placement - care transitioned to [facility]  Assessment/Plan:  Acute CVA in multiple territories, unclear etiology Concern for CNS vasculitis but no supportive evidence found after workup as per Neuro - received pulse dose steroids - now off steroids - as per Neuro "Though there was initial concern for Lyme antibodies, confirmatory western blot did not confirm. Other than mildly elevated protein, CSF was normal. Her angiogram did show some focal areas of narrowing, but these were very isolated and I feel would be more consistent with atherosclerosis then vasculitis." - no further studies to offer at this time - needs long term rehab - searching for facility at this time   Status epilepticus Resolved - cont triple AEDs for now  Persistent coma Due to above - resolved   Acute respiratory failure due to AMS - s/p tracheostomy per PCCM Changed to 6 cuffless per PCCM - progress PMV when stronger - will need trach clinic f/u in 6 wks after discharge  HTN wish to avoid overcorrection - will slowly adjust tx plan and follow   Acute on chronic renal failure Renal function has now returned to normal with GFR >90  Hypernatremia Resolved   Mild anemia, without acute blood loss Hgb is essentially stable - no evidence of blood loss - follow trend   Purulent tracheobronchitis  doubt PNA in  absence of fever or resp distress - levaquin x 7ds  DM2  Reasonably well controlled at this time  Code Status: NO CODE BLUE Family Communication: spoke w/ multiple family members at bedside Disposition Plan: stable for transfer to neuro floor bed - awaiting SNF placement (difficult as pt self pay) - denied by LTAC due to self-pay status - cont ongoing rehab efforts - family reports that they spoke to "one of the rehab doctors" and that they were told the pt "would be moving to the rehab unit once medically stable" - this is not consistent with the not from the rehab unit admit coordinator - will as for PMR MD to consult in AM  Consultants: PCCM >> [facility] Neurology - signed off  Procedures: L radial A-line 7/10 >> 7/12  R IJ CVL 7/6 >> 7/21  ETT 7/6 >> 7/30  Trach (JY) 7/30 >> 8/6 cuffless #6 >>  G tube (IR) 8/1 >>  RUE PICC 8/1 >>   Antibiotics: Levoflox 8/05 >> 8/11  DVT prophylaxis: lovenox  HPI/Subjective: Pt is sleeping soundly today.  She is in no apparent distress.  Objective: Blood pressure 153/59, pulse 68, temperature 98.6 F (37 C), temperature source Axillary, resp. rate 0, height 6' (1.829 m), weight 124.2 kg (273 lb 13 oz), SpO2 10.00%.  Intake/Output Summary (Last 24 hours) at 06/05/13 1154 Last data filed at 06/05/13 0700  Gross per 24 hour  Intake   4470 ml  Output      0 ml  Net   4470 ml    Exam: General: No acute respiratory distress Lungs:  Clear to auscultation bilaterally without wheezes or crackles with exception to mild bibasilar crackles - bs distant th/o all fields due to body habitus Cardiovascular: distant hs - regular rate and rhythm without murmur gallop or rub normal S1 and S2 Abdomen: obese - nontender, nondistended, soft, bowel sounds positive, no rebound, no ascites, no appreciable mass Extremities: No significant cyanosis, clubbing;  trace anasarca/diffuse edema  Data Reviewed: Basic Metabolic Panel:  Recent Labs Lab 06/01/13 0420  06/04/13 0400 06/05/13 0415  NA 143 137 135  K 3.6 4.2 4.3  CL 106 99 97  CO2 33* 34* 34*  GLUCOSE 161* 170* 154*  BUN 23 20 20   CREATININE 0.52 0.45* 0.44*  CALCIUM 8.5 8.3* 8.4   Liver Function Tests: No results found for this basename: AST, ALT, ALKPHOS, BILITOT, PROT, ALBUMIN,  in the last 168 hours  CBC:  Recent Labs Lab 06/01/13 0420 06/05/13 0415  WBC 6.8 7.5  HGB 7.5* 7.4*  HCT 22.8* 22.2*  MCV 91.2 90.2  PLT 295 263   CBG:  Recent Labs Lab 06/04/13 1615 06/04/13 1951 06/05/13 0012 06/05/13 0409 06/05/13 0756  GLUCAP 167* 161* 148* 151* 131*    Recent Results (from the past 240 hour(s))  CLOSTRIDIUM DIFFICILE BY PCR     Status: None   Collection Time    05/28/13  3:22 PM      Result Value Range Status   C difficile by pcr NEGATIVE  NEGATIVE Final  CULTURE, RESPIRATORY (NON-EXPECTORATED)     Status: None   Collection Time    05/31/13 12:56 PM      Result Value Range Status   Specimen Description TRACHEAL ASPIRATE   Final   Special Requests NONE   Final   Gram Stain     Final   Value: FEW WBC PRESENT,BOTH PMN AND MONONUCLEAR     NO SQUAMOUS EPITHELIAL CELLS SEEN     FEW GRAM POSITIVE COCCI     IN PAIRS FEW GRAM NEGATIVE RODS     RARE GRAM POSITIVE RODS   Culture     Final   Value: Non-Pathogenic Oropharyngeal-type Flora Isolated.     Performed at [facility]   Report Status 06/02/2013 FINAL   Final  MRSA PCR SCREENING     Status: None   Collection Time    06/02/13  5:10 PM      Result Value Range Status   MRSA by PCR NEGATIVE  NEGATIVE Final   Comment:            The GeneXpert MRSA Assay (FDA     approved for NASAL specimens     only), is one component of a     comprehensive MRSA colonization     surveillance program. It is not     intended to diagnose MRSA     infection nor to guide or     monitor treatment for     MRSA infections.     Studies:  Recent x-ray studies have been reviewed in detail by the Attending  Physician  Scheduled Meds:  Scheduled Meds: . antiseptic oral rinse  1 application Mouth Rinse QID  . aspirin  325 mg Per Tube Daily  . chlorhexidine  15 mL Mouth/Throat BID  . enoxaparin (LOVENOX) injection  40 mg Subcutaneous Q24H  . feeding supplement  30 mL Per Tube Daily  . free water  200 mL Per Tube Q6H  . insulin aspart  0-20 Units Subcutaneous Q4H  . insulin glargine  40 Units Subcutaneous QHS  . lacosamide  200 mg Per Tube BID  . levETIRAcetam  1,000 mg Per Tube BID  . levofloxacin  500 mg Per Tube Daily  . metoprolol tartrate  25 mg Per Tube BID  . phenytoin  150 mg Per Tube TID  . sodium chloride  10-40 mL Intracatheter Q12H   Time spent on care of this patient: 25 mins   [facility] T  [facility] Office  [phone] Pager - Text Page per Loretha Stapler as per below:  On-Call/Text Page:      [email or web address]      password [email or web address]  If 7PM-7AM, please contact night-coverage [email or web address] Password [email or web address] 06/05/2013, 11:54 AM   LOS: 34 days

## 2013-06-05 NOTE — Progress Notes (Signed)
Pt tx 4N per MD order, pt VSS, family at [facility], pt tol well, pt and family verbalized understanding of tx, report called to RN, all questions answered

## 2013-06-06 DIAGNOSIS — J209 Acute bronchitis, unspecified: Secondary | ICD-10-CM

## 2013-06-06 LAB — PHENYTOIN LEVEL, TOTAL: Phenytoin Lvl: 3.9 ug/mL — ABNORMAL LOW (ref 10.0–20.0)

## 2013-06-06 LAB — GLUCOSE, CAPILLARY: Glucose-Capillary: 152 mg/dL — ABNORMAL HIGH (ref 70–99)

## 2013-06-06 LAB — ALBUMIN: Albumin: 1.5 g/dL — ABNORMAL LOW (ref 3.5–5.2)

## 2013-06-06 NOTE — Progress Notes (Signed)
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 Other than mildly elevated protein, CSF was normal. Her angiogram did show some focal areas of narrowing, but these were very isolated and I feel would be more consistent with atherosclerosis then vasculitis." - no further studies to offer at this time - needs long term rehab - searching for facility at this time  -neurology signed off on 06/02/2013 -Continue aspirin -will kindly ask for PT/OT eval again Status epilepticus  Resolved - cont triple AEDs for now  Persistent coma/acute encephalopathy Due to above - resolved  Acute respiratory failure due to AMS - s/p tracheostomy per PCCM  Changed to 6 cuffless per PCCM - progress PMV when stronger - will need trach clinic f/u in 6 wks after discharge  -patient currently remains clinically stable on trach collar without any respiratory distress HTN  wish to avoid overcorrection - will slowly adjust tx plan and follow  -continue Aldactone, Avapro, and metoprolol tartrate Acute on chronic renal failure  Renal function has now returned to normal with GFR >90  Hypernatremia  Resolved  Mild anemia, without acute blood loss  Hgb is essentially stable - no evidence of blood loss - follow trend  -check iron studies, B12, RBC folate Purulent tracheobronchitis  doubt PNA in absence of fever or resp distress - levaquin x 7ds  -aggressive pulmonary toilet DM2  Reasonably well controlled at this time  -monitor CBG -continue Lantus 40units Code Status: NO CODE BLUE  Family Communication: spoke w/ husband at bedside   Disposition Plan: awaiting SNF placement (difficult as pt self pay) - denied by LTAC due to self-pay status - cont ongoing rehab efforts - family reports that they spoke to "one of the rehab doctors" and that they were told the pt "would be moving to the rehab unit once medically stable" - this is not consistent with the not from the rehab unit admit coordinator - will as for PMR MD to consult in AM  Consultants:  PCCM >> [facility]  Neurology - signed off  Procedures:  L radial A-line 7/10 >> 7/12  R IJ CVL 7/6 >> 7/21  ETT 7/6 >> 7/30  Trach (JY) 7/30 >> 8/6 cuffless #6 >>  G tube (IR) 8/1 >>  RUE PICC 8/1 >>  Antibiotics:  Levoflox 8/05 >> 8/11  DVT prophylaxis:  lovenox       Procedures/Studies: Mr Brain Wo Contrast  05/15/2013   *RADIOLOGY REPORT*  Clinical Data: 57 year old female with intracranial infarcts,. Most likely to be embolic on recent MRI.  Heart failure. Progression of disease.  MRI HEAD WITHOUT CONTRAST  Technique:  Multiplanar, multiecho pulse sequences of the brain and surrounding structures were obtained according to standard protocol without intravenous contrast.  Comparison: 05/03/2013 and earlier.  Findings: Interval decreased signal abnormality on trace diffusion in the right hemisphere, the most confluent areas of restricted diffusion on 05/03/2013 in both the right MCA and right PCA territories continue to demonstrate confluent increased trace diffusion signal.  Small lacunar infarct at the left internal capsule posterior limb not significantly changed. Mild interval extension of a central pontine linear lacunar type infarct (pontine perforator artery territory)  on series 3 image 9.  Elsewhere, the scattered punctate foci of restricted diffusion on 05/03/2013 have regressed, although several of the larger of these areas do persist (left anterior frontal lobe series 3 image 17).  Small volume petechial hemorrhage in the posterior superior right MCA territory now is evident  on T2* imaging (series 8 image 18). There is also suggestion of trace petechial hemorrhage now in the posterior right temporal lobe (image 10).  No malignant hemorrhagic transformation.  No significant mass effect related to the persistent infarct.  On FLAIR imaging, cytotoxic edema in the hemispheres has mildly regressed. Only the signal abnormality in the central pons appears mildly increased.  No intraventricular hemorrhage or ventriculomegaly. Major intracranial vascular flow voids are stable. Basilar cisterns remain patent.  Negative pituitary, cervicomedullary junction visualized cervical spine.  Visualized orbit soft tissues are within normal limits.  Increased paranasal sinus and mastoid fluid and mucosal thickening. Increased fluid layering in the pharynx.  Negative scalp soft tissues.  Normal bone marrow signal.  IMPRESSION: 1.  Overall mildly regressed signal abnormality in the brain associated with multi focal infarcts.  The most confluent infarcts in the right posterior MCA and PCA territories do demonstrate persistent restricted diffusion, but less cytotoxic edema.  There is trace petechial hemorrhage, but no malignant hemorrhagic transformation or associated mass effect. 2.  Slight extension of a central pontine lacunar infarct (pontine perforator artery territory) without mass effect or hemorrhage. 3.  No new vascular territories affected. No new intracranial abnormality.   Original Report Authenticated By: Erskine Speed, M.D.   Ir Gastrostomy Tube Mod Sed  05/27/2013   *RADIOLOGY REPORT*  Indication:  Dysphagia  PULL TROUGH GASTOSTOMY TUBE PLACEMENT  Comparison: Abdominal radiograph - earlier same day  Medications:  Versed 2 mg IV; Fentanyl 25 mcg IV; the patient is currently admitted to the hospital receiving intravenous antibiotics; Antibiotics were administered within 1 hour of the procedure.  Contrast volume:  20 mL Omnipaque-300 administered into the gastric lumen  Sedation time: 35 minutes   Fluoroscopy time: 3 minutes, 36 seconds  Complications: None immediate  PROCEDURE/FINDINGS:  Informed written consent was obtained from the patient's family following explanation of the procedure, risks, benefits and alternatives.  A time out was performed prior to the initiation of the procedure.  Maximal barrier sterile technique utilized including caps, mask, sterile gowns, sterile gloves, large sterile drape, hand hygiene and Betadine prep.  The left upper quadrant was sterilely prepped and draped.  An oral gastric catheter was inserted into the stomach under fluoroscopy. The existing nasogastric feeding tube was removed.  The left costal margin and barium opacified transverse colon were identified and avoided.  Air was injected into the stomach for insufflation and visualization under fluoroscopy.  Under sterile conditions a 17 gauge trocar needle was utilized to access the stomach percutaneously beneath the left subcostal margin after the overlying soft tissues were anesthetized with 1% Lidocaine with epinephrine.  Needle position was confirmed within the stomach with aspiration of air and injection of small amount of contrast.   A single T tack was deployed for gastropexy.  Over an Amplatz guide wire, a 9-French sheath was inserted into the stomach.  A snare device was utilized to capture the oral gastric catheter.  The snare device was pulled retrograde from the stomach up the esophagus and out the oropharynx.  The 20-French pull-through gastrostomy was connected to the snare device and pulled antegrade through the oropharynx down the esophagus into the stomach and then  through the percutaneous tract external to the patient.  The gastrostomy was assembled externally.  Contrast injection confirms position in the stomach.   Several spot radiographic images were obtained in various obliquities for documentation.  The patient tolerated procedure well without immediate post procedural complication.  IMPRESSION:   Successful fluoroscopic insertion of a 20-French "pull-through" gastrostomy.   Original Report Authenticated By: Tacey Ruiz, MD   Dg Chest Port 1 View  06/01/2013   *RADIOLOGY REPORT*  Clinical Data: Respiratory failure  PORTABLE CHEST - 1 VIEW  Comparison: May 28, 2013  Findings: Tube and catheter positions are unchanged.  No pneumothorax.  There is patchy atelectasis in the bases, stable. No new opacity.  Heart size is upper normal with normal pulmonary vascularity.  No adenopathy.  IMPRESSION: Patchy bibasilar atelectatic change.  No change in the tube and catheter positions.  No pneumothorax.   Original Report Authenticated By: Bretta Bang, M.D.   Dg Chest Port 1 View  05/28/2013   *RADIOLOGY REPORT*  Clinical Data: Respiratory failure.  PORTABLE CHEST - 1 VIEW  Comparison: 05/27/2013  Findings: Tracheostomy tube and right PICC line are in place, unchanged.  Mild cardiomegaly.  Minimal bibasilar atelectasis.  No significant effusion.  No pneumothorax.  IMPRESSION: Bibasilar atelectasis.   Original Report Authenticated By: Charlett Nose, M.D.   Dg Chest Port 1 View  05/27/2013   *RADIOLOGY REPORT*  Clinical Data: Line placement  PORTABLE CHEST - 1 VIEW  Comparison: 05/26/2013  Findings: Right-sided central venous catheter tip is difficult to see but appears be within the SVC.  This overlies the feeding tube.  Tracheostomy in good position.  Left lower lobe atelectasis, increased in the interval.  IMPRESSION: PICC tip difficult to see but appears to enter the SVC.  Increased left lower lobe atelectasis.   Original Report Authenticated By: Janeece Riggers, M.D.   Dg Chest Port 1 View  05/26/2013   *RADIOLOGY REPORT*  Clinical Data: Tracheostomy placement.  PORTABLE CHEST - 1 VIEW  Comparison: 05/25/2013.  Findings: Tracheostomy is present.  Feeding tube is also present with the tip not visualized.  Patient is rotated to the left. Allowing for rotation, there is no interval change.  Bilateral basilar  atelectasis is present, prominent in the retrocardiac region.  No airspace disease/consolidation.  No pneumothorax. Monitoring leads are projected over the chest.  IMPRESSION: Unchanged support apparatus.  Basilar atelectasis.   Original Report Authenticated By: Andreas Newport, M.D.   Chest Portable 1 View To Assess Tube Placement And Rule-out Pneumothorax  05/25/2013   *RADIOLOGY REPORT*  Clinical Data: Tracheostomy tube placement  PORTABLE CHEST - 1 VIEW  Comparison: Same date.  Findings: Tracheostomy tube is seen in grossly good position with distal tip approximately 2 cm above the carina.  Dobbhoff tube is seen passing through esophagus into stomach.  No pneumothorax is noted.  No acute pulmonary disease is noted.  Bony thorax is intact.  IMPRESSION: Tracheostomy tube in grossly good position.  No pneumothorax is noted.   Original Report Authenticated By: Lupita Raider.,  M.D.   Dg Chest Port 1 View  05/25/2013   *RADIOLOGY REPORT*  Clinical Data: Evaluate endotracheal tube position.  PORTABLE CHEST - 1 VIEW  Comparison: Chest x-ray 05/24/2013.  Findings: An endotracheal tube is in place with tip 3.8 cm above the carina. A feeding tube is seen extending into the abdomen, however, the tip of the feeding tube extends below the lower margin of the image.  Lung volumes are low.  The left basilar opacity favored to represent subsegmental atelectasis.  No definite consolidative airspace disease.  Possible trace left pleural effusion.  No evidence of pulmonary edema.  Heart size and mediastinal contours are within normal limits allowing for patient rotation to the left.  Atherosclerosis of the thoracic aorta.  IMPRESSION: 1.  Support apparatus, as above. 2.  Persistent left lower lobe subsegmental atelectasis and trace left pleural effusion. 3.  Atherosclerosis.   Original Report Authenticated By: Trudie Reed, M.D.   Dg Chest Port 1 View  05/24/2013   *RADIOLOGY REPORT*  Clinical Data: Endotracheal tube  placement  PORTABLE CHEST - 1 VIEW  Comparison: Prior chest x-ray 05/22/2013  Findings: Endotracheal tube is 3.5 cm above the carina.  The enteric feeding tube tip is not visualized but lies below the diaphragm, presumably within the stomach or proximal small bowel. Stable appearance of the chest with bilateral layering pleural effusions and associated bibasilar opacities.  Unchanged cardiomegaly.  No pneumothorax or acute osseous abnormality.  IMPRESSION: No significant interval change in the appearance of chest.   Original Report Authenticated By: Malachy Moan, M.D.   Dg Chest Port 1 View  05/22/2013   *RADIOLOGY REPORT*  Clinical Data: Check endotracheal tube.  PORTABLE CHEST - 1 VIEW  Comparison: 05/19/2013.  Findings: ETT ends at the level of the clavicular heads.  A feeding tube crosses the diaphragm.  Low lung volumes with vessel crowding, similar to previous.  No evidence of pneumothorax or significant effusion.  Normal heart size when accounting for respiratory volumes.  IMPRESSION:  1.  Good positioning of endotracheal and enteric tubes. 2.  Low lung volumes.   Original Report Authenticated By: Tiburcio Pea   Dg Chest Port 1 View  05/19/2013   *RADIOLOGY REPORT*  Clinical Data: Respiratory failure, follow-up  PORTABLE CHEST - 1 VIEW  Comparison: Portable chest x-ray of 05/14/2013  Findings: The tip of the endotracheal tube remains approximately 5.2 cm above the carina.  The lungs appear well aerated.  Mild cardiomegaly is stable.  IMPRESSION: Slightly better aeration.  No change in position of endotracheal tube.   Original Report Authenticated By: Dwyane Dee, M.D.   Dg Chest Port 1 View  05/14/2013   *RADIOLOGY REPORT*  Clinical Data: Evaluate endotracheal tube position  PORTABLE CHEST - 1 VIEW  Comparison: 05/13/2013  Findings: No change in the position of endotracheal tube with tip about 4.7 cm above the carina.  No change in position of right central line.  Heart size normal.  No abnormal  pulmonary parenchymal opacities.  No change position of the NG tube.  IMPRESSION: Stable appearance of support devices.  Improved bilateral lower lobe aeration.   Original Report Authenticated By: Esperanza Heir, M.D.   Dg Chest Port 1 View  05/13/2013   *RADIOLOGY REPORT*  Clinical Data: Evaluate endotracheal tube position.  PORTABLE CHEST - 1 VIEW  Comparison: 05/11/2013 through 05/08/2013.  Findings: Endotracheal tube tip is 44 mm from the carina.  Enteric tube is present with redundant loop in the proximal stomach.  Right IJ central line is unchanged with the tip at the cavoatrial junction.  Cardiopericardial silhouette appears similar.  Pulmonary vascular congestion.  Mild basilar airspace disease compatible with pulmonary edema.  Small right pleural effusion.  Compared yesterday's exam, the pulmonary aeration appears slightly worse. Retrocardiac density is present likely representing atelectasis, edema and effusion.  IMPRESSION:  1.  Stable support apparatus. 2.  Worsening pulmonary aeration compatible with developing pulmonary edema and small right pleural effusion.  Original Report Authenticated By: Andreas Newport, M.D.   Dg Chest Port 1 View  05/11/2013   *RADIOLOGY REPORT*  Clinical Data: Evaluate endotracheal tube position  PORTABLE CHEST - 1 VIEW  Comparison: Portable chest x-ray of 05/10/2013  Findings:   The tip of the endotracheal tube is approximately 2.6 cm above the carina.  Mild basilar atelectasis has improved somewhat.  Mild cardiomegaly is stable.  The right central venous line tip overlies expected SVC - RA junction and an NG tube remains.  IMPRESSION:  1.  Slightly better aeration with mild basilar atelectasis and possible effusions. 2.  Tip of endotracheal tube 2.6 cm above the carina.   Original Report Authenticated By: Dwyane Dee, M.D.   Dg Chest Port 1 View  05/10/2013   *RADIOLOGY REPORT*  Clinical Data: 57 year old female with respiratory failure. Intubated.  PORTABLE CHEST -  1 VIEW  Comparison: 05/08/2013 and earlier.  Findings: AP portable semi upright view of the chest at 0547 hours. The patient is more rotated to the right.  Endotracheal tube tip is stable at the level of the clavicles.  Enteric tube courses to the abdomen and appears stable, looping in the gastric cardia region. Right IJ approach central line is stable.  Increased EKG leads and wires overlie the chest.  Bilateral veiling pulmonary opacities compatible with pleural effusions right greater than left.  These appear increased.  Increased retrocardiac atelectasis or consolidation.  Stable cardiac size and mediastinal contours.  No pneumothorax.  Continued pulmonary vascular congestion, not significantly changed.  IMPRESSION: 1.  Rotated. Stable lines and tubes. 2.  Increased appearance of bilateral pleural effusions and lower lobe collapse / consolidation. 3.  Stable vascular congestion.   Original Report Authenticated By: Erskine Speed, M.D.   Dg Chest Port 1 View  05/08/2013   *RADIOLOGY REPORT*  Clinical Data: Acute cerebral infarct.  Acute respiratory failure. On ventilator.  PORTABLE CHEST - 1 VIEW  Comparison: 05/06/2013  Findings: Right jugular center venous catheter, nasogastric tube and endotracheal tube remain in appropriate position.  Improved aeration is seen in the right lung base.  There is persistent atelectasis at the left lung base.  No evidence of pulmonary consolidation.  Heart size is stable.  IMPRESSION: Decreased right basilar atelectasis, with persistent atelectasis in left lung base.   Original Report Authenticated By: Myles Rosenthal, M.D.   Dg Abd Portable 1v  05/27/2013   *RADIOLOGY REPORT*  Clinical Data: Evaluate barium.  PORTABLE ABDOMEN - 1 VIEW  Comparison: 05/20/2013.  Findings: Weighted feeding tube is present with the tip in the antrum of the stomach.  Barium outlines the colon.  Small amount of residual barium is present within the small bowel.  Bowel gas pattern is nonobstructive. The  nasogastric tube appears to have been removed.  IMPRESSION: Barium outlines the colon.  Feeding tube tip in the antrum of the stomach.   Original Report Authenticated By: Andreas Newport, M.D.   Dg Abd Portable 1v  05/20/2013   *RADIOLOGY REPORT*  Clinical Data: Feeding tube placement  PORTABLE ABDOMEN - 1 VIEW  Comparison: May 07, 2013.  Findings: No abnormal bowel gas pattern is noted.  Dobbhoff tube tip is seen in expected position of the gastric body.  Nasogastric tube tip is seen in distal stomach.  IMPRESSION: No evidence of bowel obstruction or ileus.  Nasogastric tube tip seen in distal stomach.  Distal tip of Dobbhoff tube seen in body of stomach.   Original Report Authenticated By: Lupita Raider.,  M.D.         Subjective: Patient is awake and alert. She denies any fevers, chills, chest discomfort, shortness of breath, abdominal pain, nausea, vomiting, abdominal pain.  Objective: Filed Vitals:   06/05/13 2355 06/06/13 0113 06/06/13 0354 06/06/13 0509  BP:  141/58  146/61  Pulse: 73 73 75 70  Temp:  98.4 F (36.9 C)  98.3 F (36.8 C)  TempSrc:  Axillary  Axillary  Resp: 18 20 18 20   Height:      Weight:      SpO2: 99% 97% 97% 98%    Intake/Output Summary (Last 24 hours) at 06/06/13 1030 Last data filed at 06/05/13 1329  Gross per 24 hour  Intake     20 ml  Output      0 ml  Net     20 ml   Weight change:  Exam:   General:  Pt is alert, follows commands appropriately, not in acute distress  HEENT: No icterus, No thrush,  [place]/AT  Cardiovascular: RRR, S1/S2, no rubs, no gallops  Respiratory: scattered bilateral rales. No wheezes. Good air movement.  Abdomen: Soft/+BS, non tender, non distended, no guarding;gastrostomy tube site without erythema or induration  Extremities: 1+LE and UE edema, No lymphangitis, No petechiae, No rashes, no synovitis  Data Reviewed: Basic Metabolic Panel:  Recent Labs Lab 06/01/13 0420 06/04/13 0400 06/05/13 0415  NA 143  137 135  K 3.6 4.2 4.3  CL 106 99 97  CO2 33* 34* 34*  GLUCOSE 161* 170* 154*  BUN 23 20 20   CREATININE 0.52 0.45* 0.44*  CALCIUM 8.5 8.3* 8.4   Liver Function Tests:  Recent Labs Lab 06/06/13 0600  ALBUMIN 1.5*   No results found for this basename: LIPASE, AMYLASE,  in the last 168 hours No results found for this basename: AMMONIA,  in the last 168 hours CBC:  Recent Labs Lab 06/01/13 0420 06/05/13 0415  WBC 6.8 7.5  HGB 7.5* 7.4*  HCT 22.8* 22.2*  MCV 91.2 90.2  PLT 295 263   Cardiac Enzymes: No results found for this basename: CKTOTAL, CKMB, CKMBINDEX, TROPONINI,  in the last 168 hours BNP: No components found with this basename: POCBNP,  CBG:  Recent Labs Lab 06/05/13 1618 06/05/13 1956 06/06/13 0016 06/06/13 0355 06/06/13 0758  GLUCAP 157* 157* 161* 141* 129*    Recent Results (from the past 240 hour(s))  CLOSTRIDIUM DIFFICILE BY PCR     Status: None   Collection Time    05/28/13  3:22 PM      Result Value Range Status   C difficile by pcr NEGATIVE  NEGATIVE Final  CULTURE, RESPIRATORY (NON-EXPECTORATED)     Status: None   Collection Time    05/31/13 12:56 PM      Result Value Range Status   Specimen Description TRACHEAL ASPIRATE   Final   Special Requests NONE   Final   Gram Stain     Final   Value: FEW WBC PRESENT,BOTH PMN AND MONONUCLEAR     NO SQUAMOUS EPITHELIAL CELLS SEEN     FEW GRAM POSITIVE COCCI     IN PAIRS FEW GRAM NEGATIVE RODS     RARE GRAM POSITIVE RODS   Culture     Final   Value: Non-Pathogenic Oropharyngeal-type Flora Isolated.     Performed at [facility]   Report Status 06/02/2013 FINAL   Final  MRSA PCR SCREENING     Status: None   Collection Time  06/02/13  5:10 PM      Result Value Range Status   MRSA by PCR NEGATIVE  NEGATIVE Final   Comment:            The GeneXpert MRSA Assay (FDA     approved for NASAL specimens     only), is one component of a     comprehensive MRSA colonization     surveillance  program. It is not     intended to diagnose MRSA     infection nor to guide or     monitor treatment for     MRSA infections.     Scheduled Meds: . antiseptic oral rinse  1 application Mouth Rinse QID  . aspirin  325 mg Per Tube Daily  . chlorhexidine  15 mL Mouth/Throat BID  . enoxaparin (LOVENOX) injection  40 mg Subcutaneous Q24H  . feeding supplement  30 mL Per Tube Daily  . free water  100 mL Per Tube Q8H  . insulin aspart  0-20 Units Subcutaneous Q4H  . insulin glargine  40 Units Subcutaneous QHS  . irbesartan  150 mg Per Tube Daily  . lacosamide  200 mg Per Tube BID  . levETIRAcetam  1,000 mg Per Tube BID  . metoprolol tartrate  25 mg Per Tube BID  . phenytoin  150 mg Per Tube TID  . sodium chloride  10-40 mL Intracatheter Q12H  . spironolactone  25 mg Per Tube Daily   Continuous Infusions: . feeding supplement (GLUCERNA 1.2 CAL) 1,000 mL (06/05/13 1403)     Micholas Drumwright, DO  [facility] Pager [phone]  If 7PM-7AM, please contact night-coverage [email or web address] Password [email or web address] 06/06/2013, 10:30 AM   LOS: 35 days

## 2013-06-06 NOTE — Clinical Social Work Note (Signed)
CSW met with pt's family at bedside. Pt's family voiced a desire to have more communication with medical staff regarding pt care. CSW provided support and informed pt's husband that CSW is currently working on SNF placement for pt. CSW also informed pt's husband that due to the pt's trach, SNF placement can be difficult because not all facilities have the means to care for trach needs. CSW followed-up with [facility] [place]: [phone]) regarding possible placement. CSW left a message and is awaiting a call from [place]. CSW to continue to follow and assist with SNF placement needs.  Darlyn Chamber, MSW, LCSWA Clinical Social Work [phone]

## 2013-06-06 NOTE — Progress Notes (Signed)
During peri care on patient , she was found to have skin breakdown around the rectum which seems to be more like diaper rash.Barrier cream applied and turned. Will continue to monitor.

## 2013-06-06 NOTE — Progress Notes (Signed)
Physical Therapy Treatment Patient Details Name: Brenda Pope MRN: [id number] DOB: 1956-07-18 Today's Date: 06/06/2013 Time: 4098-1191 PT Time Calculation (min): 55 min  PT Assessment / Plan / Recommendation  History of Present Illness Pt admitted with CVA.  Intubated 7/6 for airway protection following suspected seizure activity. Hypotensive post intubation with vasopressors.  MRI revealed small acute infarcts R parietal lobe. Pt has been comatose most of this hospitalization.7/20 - MRI brain: Overall mildly regressed signal abnormality in the brain associated with multi focal infarcts. The most confluent infarcts in the right posterior MCA and PCA territories do demonstrate persistent restricted diffusion, but less cytotoxic edema. There is trace petechial hemorrhage, but no malignant hemorrhagic transformation or associated mass effect. 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 The most confluent infarcts in the right posterior MCA and PCA territories do demonstrate persistent restricted diffusion, but less cytotoxic edema. There is trace petechial hemorrhage, but no malignant hemorrhagic transformation or associated mass effect. 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 The most confluent infarcts in the right posterior MCA and PCA territories do demonstrate persistent restricted diffusion, but less cytotoxic edema. There is trace petechial hemorrhage, but no malignant hemorrhagic transformation or associated mass effect. 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 The most confluent infarcts in the right posterior MCA and PCA territories do demonstrate persistent restricted diffusion, but less cytotoxic edema. There is trace petechial hemorrhage, but no malignant hemorrhagic transformation or associated mass effect. 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 The most confluent infarcts in the right posterior MCA and PCA territories do demonstrate persistent restricted diffusion, but less cytotoxic edema. Ther   PT Comments   Pt profoundly weak bilaterally.  Can not initiate any movement at this time.  Will continue to try activity to elicit truncal activation incl. Sitting EOB and starting sitting in recliner.   Follow Up Recommendations  Supervision/Assistance - 24 hour     Does the patient have the potential to tolerate intense rehabilitation     Barriers to Discharge        Equipment Recommendations  Other (comment) (TBA post acute)    Recommendations for Other Services    Frequency Min 3X/week   Progress towards PT Goals Progress towards PT goals: Not progressing toward goals - comment  Plan Current plan remains appropriate    Precautions / Restrictions Precautions Precautions: Fall Precaution Comments: trach with 02 Restrictions Weight Bearing Restrictions: No   Pertinent Vitals/Pain     Mobility  Bed Mobility Bed Mobility: Rolling Right;Rolling Left;Right Sidelying to Sit;Sit to Supine;Scooting to [facility] Rolling Right: 1: +2 Total assist Rolling Right: Patient Percentage: 0% Rolling Left: 1: +2 Total assist Rolling Left: Patient Percentage: 0% Right Sidelying to Sit: 1: +2 Total assist Right  Sidelying to Sit: Patient Percentage: 0% Sit to Supine: 1: +2 Total assist Sit to Supine: Patient  Percentage: 0% Scooting to HOB: 1: +2 Total assist Scooting to [facility]: Patient Percentage: 0% Details for Bed Mobility Assistance: Very little initiation and profoundly weak large muscle groups and trunK Transfers Transfers: Not assessed Ambulation/Gait Ambulation/Gait Assistance: Not tested (comment) Modified Rankin (Stroke Patients Only) Modified Rankin: Severe disability    Exercises General Exercises - Lower Extremity Heel Slides: PROM;10 reps;Both;Supine Other Exercises Other Exercises: PROM to bil LE's and to bil shoulders, elbows and wrists   PT Diagnosis:    PT Problem List:   PT Treatment Interventions:     PT Goals (current goals can now be found in the care plan section) Acute Rehab PT Goals PT Goal Formulation: With family Time For Goal Achievement: 06/15/13 Potential to Achieve Goals: Fair  Visit Information  Last PT Received On: 06/06/13 Assistance Needed: +2 History of Present Illness: Pt admitted with CVA.  Intubated 7/6 for airway protection following suspected seizure activity. Hypotensive post intubation with vasopressors.  MRI revealed small acute infarcts R parietal lobe. Pt has been comatose most of this hospitalization.7/20 - MRI brain: Overall mildly regressed signal abnormality in the brain associated with multi focal infarcts. The most confluent infarcts in the right posterior MCA and PCA territories do demonstrate persistent restricted diffusion, but less cytotoxic edema. There is trace petechial hemorrhage, but no malignant hemorrhagic transformation or associated mass effect. 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 The most confluent infarcts in the right posterior  MCA and PCA territories do demonstrate persistent restricted diffusion, but less cytotoxic edema. There is trace petechial hemorrhage, but no malignant hemorrhagic transformation or associated mass effect. 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 The most confluent infarcts in the right posterior MCA and PCA territories do demonstrate persistent restricted diffusion, but less cytotoxic edema. There is trace petechial hemorrhage, but no malignant hemorrhagic transformation or associated mass effect. 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 The most confluent infarcts in the right posterior MCA and PCA territories do demonstrate persistent restricted diffusion, but less cytotoxic edema. There is trace petechial hemorrhage, but no malignant hemorrhagic transformation or associated mass effect. 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 The most confluent infarcts in the right posterior MCA and PCA territories do demonstrate persistent restricted diffusion, but less cytotoxic edema. Ther    Subjective Data  Subjective: Relays when something hurts, answers yes/no questions approp.   Cognition  Cognition Arousal/Alertness: Awake/alert Behavior During Therapy: Flat affect Overall Cognitive Status: Impaired/Different from baseline Orientation Level: Time Current Attention Level: Sustained Following Commands: Follows one step  commands with increased time Problem Solving: Slow processing;Decreased initiation    Balance  Balance Balance Assessed: Yes Static Sitting Balance Static Sitting - Balance Support: Feet supported;No upper extremity supported (UE's unable to assist, but were supported.) Static Sitting - Level of Assistance: 1: +2 Total assist;Patient percentage (comment) Static Sitting - Comment/#  of Minutes: EOB>10 min.  Pt continuing to need 100% assist.  Spent time trying to ellicit truncal activation/cervical extension  End of Session PT - End of Session Equipment Utilized During Treatment: Oxygen Activity Tolerance: Patient limited by fatigue Patient left: in bed;with call bell/phone within reach;with family/visitor present Nurse Communication: Mobility status   GP     Jensyn Cambria, Eliseo Gum 06/06/2013, 5:59 PM 06/06/2013  [place] Bing, PT [phone]  (pager)

## 2013-06-06 NOTE — Progress Notes (Signed)
Pharmacy - Dilantin  Dilantin level this AM = 3.9 Albumin = 1.5 Dilantin level corrects to therapeutic No new seizures noted  Plan: 1) Continue Dilantin 150 mg per tube Q 8 hours 2) Continue to follow  Thank you. Okey Regal, PharmD

## 2013-06-06 NOTE — Progress Notes (Signed)
NUTRITION FOLLOW UP  Intervention:   1. Continue Glucerna 1.2 @ 70 ml/hr with 30 ml Prostat daily and 100 ml free water flush TID.  TF regimen is providing 2116 kcal, 115 grams protein, 192 grams CHO, and 1360 ml H2O. Total free water: 1660 ml.   Nutrition Dx:   Inadequate oral intake related to inability to eat as evidenced by NPO status; ongoing.   Goal:  Pt to meet >/= 90% of their estimated nutrition needs. Met   Monitor:   TF tolerance/adequacy, labs, weight trend, vent status, overall goals of care.  Assessment:   Pt admitted with small acute infarcts and seizures. Pt s/p trach 7/30 and PEG 8/1.  Per husband pt with liable blood sugars PTA. Stated that it was a miracle when her blood sugar was < 200.   Pt has trach in place, no vent support. Continues to work with SLP for PMV use.  Patient has PEG in place. Jevity 1.2 running at 70 ml/hr. No issues with TF noted.   Planned for d/c to SNF once bed placement  Can be secured.   Height: Ht Readings from Last 1 Encounters:  05/02/13 6' (1.829 m)    Weight Status:  Trending up with positive fluid status. Wt Readings from Last 1 Encounters:  05/31/13 273 lb 13 oz (124.2 kg)  with noted 2+edema  05/07/13  254 lb 3.1 oz (115.3 kg)  05/06/13  257 lb 0.9 oz (116.6 kg)  05/02/13  238 lb 1.6 oz (108 kg)   Re-estimated needs:  Kcal: 2050-2250 Protein: 100-120 grams Fluid: > 2 L/day Skin: no issues  Diet Order: NPO   Intake/Output Summary (Last 24 hours) at 06/06/13 1030 Last data filed at 06/05/13 1329  Gross per 24 hour  Intake     20 ml  Output      0 ml  Net     20 ml    Last BM: 8/10  Labs:   Recent Labs Lab 06/01/13 0420 06/04/13 0400 06/05/13 0415  NA 143 137 135  K 3.6 4.2 4.3  CL 106 99 97  CO2 33* 34* 34*  BUN 23 20 20   CREATININE 0.52 0.45* 0.44*  CALCIUM 8.5 8.3* 8.4  GLUCOSE 161* 170* 154*    CBG (last 3)   Recent Labs  06/06/13 0016 06/06/13 0355 06/06/13 0758  GLUCAP 161* 141*  129*    Scheduled Meds: . antiseptic oral rinse  1 application Mouth Rinse QID  . aspirin  325 mg Per Tube Daily  . chlorhexidine  15 mL Mouth/Throat BID  . enoxaparin (LOVENOX) injection  40 mg Subcutaneous Q24H  . feeding supplement  30 mL Per Tube Daily  . free water  100 mL Per Tube Q8H  . insulin aspart  0-20 Units Subcutaneous Q4H  . insulin glargine  40 Units Subcutaneous QHS  . irbesartan  150 mg Per Tube Daily  . lacosamide  200 mg Per Tube BID  . levETIRAcetam  1,000 mg Per Tube BID  . metoprolol tartrate  25 mg Per Tube BID  . phenytoin  150 mg Per Tube TID  . sodium chloride  10-40 mL Intracatheter Q12H  . spironolactone  25 mg Per Tube Daily    Continuous Infusions: . feeding supplement (GLUCERNA 1.2 CAL) 1,000 mL (06/05/13 1403)    Clarene Duke RD, LDN Pager [phone] After Hours pager [phone]

## 2013-06-07 LAB — BASIC METABOLIC PANEL
CO2: 33 mEq/L — ABNORMAL HIGH (ref 19–32)
Chloride: 100 mEq/L (ref 96–112)
Potassium: 4.1 mEq/L (ref 3.5–5.1)
Sodium: 137 mEq/L (ref 135–145)

## 2013-06-07 LAB — GLUCOSE, CAPILLARY
Glucose-Capillary: 136 mg/dL — ABNORMAL HIGH (ref 70–99)
Glucose-Capillary: 160 mg/dL — ABNORMAL HIGH (ref 70–99)
Glucose-Capillary: 166 mg/dL — ABNORMAL HIGH (ref 70–99)
Glucose-Capillary: 173 mg/dL — ABNORMAL HIGH (ref 70–99)
Glucose-Capillary: 181 mg/dL — ABNORMAL HIGH (ref 70–99)

## 2013-06-07 LAB — CBC
HCT: 23 % — ABNORMAL LOW (ref 36.0–46.0)
MCV: 89.8 fL (ref 78.0–100.0)
RBC: 2.56 MIL/uL — ABNORMAL LOW (ref 3.87–5.11)
WBC: 8.7 10*3/uL (ref 4.0–10.5)

## 2013-06-07 MED ORDER — IRBESARTAN 300 MG PO TABS
300.0000 mg | ORAL_TABLET | Freq: Every day | ORAL | Status: DC
  Administered 2013-06-07 – 2013-06-17 (×11): 300 mg
  Filled 2013-06-07 (×11): qty 1

## 2013-06-07 MED ORDER — IRBESARTAN 300 MG PO TABS: 300.0000 mg | ORAL_TABLET | Freq: Every day | ORAL | Status: DC

## 2013-06-07 NOTE — Progress Notes (Signed)
Passy-Muir Speaking Valve - Treatment Patient Details  Name: Brenda Pope MRN: [id number] Date of Birth: [birth year]  Today's Date: 06/07/2013 Time: 1130-1145 SLP Time Calculation (min): 15 min  Past Medical History:  Past Medical History  Diagnosis Date  . Stroke   . Diabetes mellitus without complication   . Hypertension   . CHF (congestive heart failure)    Past Surgical History: History reviewed. No pertinent past surgical history.  Assessment / Plan / Recommendation Clinical Impression  Pt wore PMSV for ten minutes with improved quality of phonation and length-of-utterance due to improved breath support.  Phonation remains hoarse/low pitched but intelligibility is improved.  She requires encouragement to wear valve, stating it makes her tired.  Sp02 remained at 98% and HR was stable throughout use.  Mr. Baxendale has been educated re: placement/removal of valve.  Pt agreed to attempt to wear valve 15 minutes every hour with supervision of her husband.  SLP will continue to follow.    Plan  Continue with current plan of care : Pt should increase use of PMSV with the full supervision of her husband and staff.   Follow Up Recommendations    CIR vs SNF       SLP Goals Potential to Achieve Goals: Good SLP Goal #1: Pt will selectively attend to target stimuli in quiet environment for ten minutes with 80% accuracy. SLP Goal #1 - Progress: Progressing toward goal SLP Goal #2: Pt will use alternative methods to gain attention/call staff when valve not in place 3x per session. SLP Goal #2 - Progress: Progressing toward goal SLP Goal #3: Pt will utilize upper airway for cough/phonation with max cues and stable VS with PMSV in place SLP Goal #3 - Progress: Met SLP Goal #4: Pt will use PMSV and increase intelligibility to 90% with appropriate respiratory pacing.    PMSV Trial  PMSV was placed for: 10 minutes Able to redirect subglottic air through upper airway: Yes Able to Attain  Phonation: Yes Voice Quality: Aphonic;Hoarse;Low vocal intensity Able to Expectorate Secretions: No Breath Support for Phonation: Inadequate Intelligibility: Intelligibility reduced Word: 50-74% accurate Phrase: 50-74% accurate Sentence: 50-74% accurate SpO2 During Trial: 98 % Pulse During Trial: 76   Tracheostomy Tube       Vent Dependency  FiO2 (%): 28 %    Cuff Deflation Trial  GO         [name] 06/07/2013, 11:59 AM

## 2013-06-07 NOTE — Progress Notes (Signed)
Physical Therapy Treatment Patient Details Name: Brenda Pope MRN: [id number] DOB: 1956/05/13 Today's Date: 06/07/2013 Time: 4098-1191 PT Time Calculation (min): 50 min  PT Assessment / Plan / Recommendation  History of Present Illness Pt admitted with CVA.  Intubated 7/6 for airway protection following suspected seizure activity. Hypotensive post intubation with vasopressors.  MRI revealed small acute infarcts R parietal lobe. Pt has been comatose most of this hospitalization.7/20 - MRI brain: Overall mildly regressed signal abnormality in the brain associated with multi focal infarcts. The most confluent infarcts in the right posterior MCA and PCA territories do demonstrate persistent restricted diffusion, but less cytotoxic edema. There is trace petechial hemorrhage, but no malignant hemorrhagic transformation or associated mass effect. 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 The most confluent infarcts in the right posterior MCA and PCA territories do demonstrate persistent restricted diffusion, but less cytotoxic edema. There is trace petechial hemorrhage, but no malignant hemorrhagic transformation or associated mass effect. 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 The most confluent infarcts in the right posterior MCA and PCA territories do demonstrate persistent restricted diffusion, but less cytotoxic edema. There is trace petechial hemorrhage, but no malignant hemorrhagic transformation or associated mass effect. 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 The most confluent infarcts in the right posterior MCA and PCA territories do demonstrate persistent restricted diffusion, but less cytotoxic edema. There is trace petechial hemorrhage, but no malignant hemorrhagic transformation or associated mass effect. 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 The most confluent infarcts in the right posterior MCA and PCA territories do demonstrate persistent restricted diffusion, but less cytotoxic edema.   PT Comments   Emphasis on AAROM available UE and LE's,  Sitting EOB  And rolling for truncal activation.  Pt extremely quick to fatigue.   Follow Up Recommendations  Supervision/Assistance - 24 hour;SNF;LTACH     Does the patient have the potential to tolerate intense rehabilitation     Barriers to Discharge        Equipment Recommendations  Other (comment) (TBA post acute)    Recommendations for Other Services    Frequency Min 3X/week   Progress towards PT Goals Progress towards PT goals: Progressing toward goals  Plan Current plan remains appropriate    Precautions / Restrictions Precautions Precautions: Fall Precaution Comments: trach with 02 Restrictions Weight Bearing Restrictions: No   Pertinent Vitals/Pain     Mobility  Bed Mobility Bed Mobility: Rolling Right;Rolling Left;Right Sidelying to Sit;Sit to Supine;Scooting to [facility] Rolling Right: 1: +2 Total assist Rolling Right: Patient Percentage: 0% Rolling Left: 1: +2 Total assist Rolling Left: Patient Percentage: 0% Right Sidelying to Sit: 1: +2 Total assist Right Sidelying to Sit: Patient Percentage: 0% Sit to Supine: 1: +2 Total assist Sit  to Supine: Patient Percentage: 0% Scooting to HOB: 1: +2 Total assist Scooting to [facility]: Patient  Percentage: 0% Details for Bed Mobility Assistance: Very little initiation and profoundly weak large muscle groups and trunK Transfers Transfers: Not assessed Ambulation/Gait Ambulation/Gait Assistance: Not tested (comment) Modified Rankin (Stroke Patients Only) Pre-Morbid Rankin Score: No symptoms Modified Rankin: Severe disability    Exercises General Exercises - Lower Extremity Heel Slides: PROM;10 reps;Both;Supine Other Exercises Other Exercises: AA/PROM to bil LE's  (active assist to R LE at hip abd/ ER, ankle df/pf)   PT Diagnosis:    PT Problem List:   PT Treatment Interventions:     PT Goals (current goals can now be found in the care plan section) Acute Rehab PT Goals PT Goal Formulation: With family Time For Goal Achievement: 06/15/13 Potential to Achieve Goals: Fair  Visit Information  Last PT Received On: 06/07/13 Assistance Needed: +2 History of Present Illness: Pt admitted with CVA.  Intubated 7/6 for airway protection following suspected seizure activity. Hypotensive post intubation with vasopressors.  MRI revealed small acute infarcts R parietal lobe. Pt has been comatose most of this hospitalization.7/20 - MRI brain: Overall mildly regressed signal abnormality in the brain associated with multi focal infarcts. The most confluent infarcts in the right posterior MCA and PCA territories do demonstrate persistent restricted diffusion, but less cytotoxic edema. There is trace petechial hemorrhage, but no malignant hemorrhagic transformation or associated mass effect. 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 The most confluent infarcts in the right posterior MCA and PCA territories do  demonstrate persistent restricted diffusion, but less cytotoxic edema. There is trace petechial hemorrhage, but no malignant hemorrhagic transformation or associated mass effect. 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 The most confluent infarcts in the right posterior MCA and PCA territories do demonstrate persistent restricted diffusion, but less cytotoxic edema. There is trace petechial hemorrhage, but no malignant hemorrhagic transformation or associated mass effect. 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 The most confluent infarcts in the right posterior MCA and PCA territories do demonstrate persistent restricted diffusion, but less cytotoxic edema. There is trace petechial hemorrhage, but no malignant hemorrhagic transformation or associated mass effect. 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 The most confluent infarcts in the right posterior MCA and PCA territories do demonstrate persistent restricted diffusion, but less cytotoxic edema. Ther    Subjective Data  Subjective: Relays when something hurts, answers yes/no questions approp.   Cognition  Cognition Arousal/Alertness: Awake/alert Behavior During Therapy: Flat affect Overall Cognitive Status: Impaired/Different from baseline Orientation Level: Time Current Attention Level: Sustained Following Commands: Follows one step commands with increased  time Problem Solving: Slow processing;Decreased initiation    Balance  Balance Balance Assessed: Yes Static Sitting Balance Static Sitting - Balance Support: Feet supported;No upper extremity supported (UE's unable to assist, but were supported.) Static Sitting - Level of Assistance: 1: +2 Total assist;Patient percentage (comment) Static Sitting - Comment/# of Minutes: 8  min, pt needing 100% assist; trying to elicit truncal/cervical activation.  End of Session PT - End of Session Equipment Utilized During Treatment: Oxygen Activity Tolerance: Patient limited by fatigue Patient left: in bed;with call bell/phone within reach;with family/visitor present Nurse Communication: Mobility status   GP     Nakita Santerre, Eliseo Gum 06/07/2013, 4:02 PM 06/07/2013  [place]ly[place] Bing, PT [phone]  (pager)

## 2013-06-07 NOTE — Progress Notes (Signed)
[facility] PROGRESS NOTE  Brenda Pope ZOX:[id number] DOB: [birth year] DOA: 05/02/2013 PCP: Bobbye Riggs, NP  Brief Narrative 57 y/o with past medical history of CVAs/TIAs and diastolic heart failure, severe baseline hypertension with noncompliance, poorly controlled diabetes who was admitted to [facility] on 7/5 with acute dyspnea and suspected CHF exacerbation. On the day of admission she developed neurological findings consistent with acute CVA. 7/6 she required intubated for airway protection during suspected seizure activity. She became hypotensive post intubation requiring vasopressors. MRI confirmed small acute infarcts of the right parietal lobe. On 7/7 she was transferred to [facility] for further management per the PCCM service.  Transferred to [facility] 05/31/13, then transferred to Neuroscience floor 06/05/13.  SIGNIFICANT EVENTS / STUDIES:  7/05 - Admitted to [facility] [facility] with CHF exacerbation; developed stroke symptoms  7/06 - Suspected seizure activity, intubated for airway protection; hypotensive post intubation requiring vasopressors  7/06 - Brain MRI: Multiple small acute infarcts R parietal lobe, possible small infarcts left basal ganglia and central pons  7/06 - TTE: No source of thrombus/CVA, EF 55-60%  7/06 - Carotid Doppler: No evidence of hemodynamically significant stenosis  7/07 - Transferred to [facility] - PCCM serivce  7/07 - Neuro consult: "Concern patient is in SE in the context of recent cortical stroke."  7/08 - MRI brain: Confluent and scattered right MCA and right PCA acute infarcts, plus scattered small left hemisphere and occasional posterior fossa acute infarcts. The vast majority of these infarcts are newly seen since 05/01/2013. This appearance suggests embolic phenomena which severely affected more proximal/medium-sized vessels of the right MCA and PCA territories. Cytotoxic edema without significant mass effect at this time. No associated hemorrhage  7/08 - EEG: severe  diffuse encephalopathic process with asymmetrical involvement. Findings reported from the left hemisphere were nonspecific. Findings record from the right hemisphere were consistent with acute large area of infarction. No frank epileptiform activity was recorded. PLEDs not considered epileptic, most often seen with acute large hemispheric strokes  7/09 - EEG: consistent with recurrent seizures which amount to partial status epilepticus in the right hemisphere.  7/10 - LP performed by Neuro  7/10 - Burst suppression with propofol. Norepi initiated to prevent propofol induced hypotension. Consideration of primary CNS vasculitis. Serologies ordered>> CRP, ESR markedly elevated. High dose methylpred ordered. Insulin gtt ordered while on high dose steriods  7/10 - propofol ineffective at burst suppression. pentobarbitol initiated  7/11 - CT head: Right frontal lobe, right parietal lobe, right occipital lobe and posterior right temporal lobe acute/subacute infarcts. Surrounding edema with local mass effect. Minimal petechial hemorrhage suspected particularly along the superior margin of the infarct. No gross hemorrhage noted. No midline shift 7/11 - angiogram eval for vasculitis: smooth focal areas of narrowing of prox PCAs and distal pericallosal. Subcortical branches, suspicious of vasculitis.  7/14 - TEE: no evidence of thrombus  7/20 - MRI brain: Overall mildly regressed signal abnormality in the brain associated with multi focal infarcts. The most confluent infarcts in the right posterior MCA and PCA territories do demonstrate persistent restricted diffusion, but less cytotoxic edema. There is trace petechial hemorrhage, but no malignant hemorrhagic transformation or associated mass effect. Slight extension of a central pontine lacunar infarct (pontine perforator artery territory) without mass effect or hemorrhage  7/21 - EEG: severe encephalopathy, nonspecific as to cause. No electrography seizures noted.   7/22 - Family indicated that they did not wish to pursue trach/G-tube and SNF. However, they were not ready to "give up" yet citing their concern  that all of the meds used to induced coma might not have fully washed out of her system yet. Noted that she had developed severe hypernatremia. Free water increased. Made DNR in event of cardiac arrest  7/28 - pt following commands, family is now considering Trach and rehab for patient. Husband to decide in the coming days.  7/30 - Trach completed by Dr. 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 Other than mildly elevated protein, CSF was normal. Her angiogram did show some focal areas of narrowing, but these were very isolated and I feel would be more consistent with atherosclerosis then vasculitis." - no further studies to offer at this time  -needs long term rehab - searching for facility at this time  -neurology signed off on 06/02/2013  -Continue aspirin  -will kindly ask for PT/OT eval again  Status epilepticus  Resolved - cont triple AEDs for now  Persistent coma/acute encephalopathy  Due to above - resolved  Acute respiratory failure due to AMS - s/p tracheostomy per PCCM  -Changed to 6 cuffless per PCCM - progress PMV when stronger - will need trach clinic f/u in 6 wks after discharge  -patient currently remains clinically stable on trach collar without any respiratory distress--100% oxygen sat HTN  -will slowly adjust tx plan and follow  -continue Aldactone, Avapro, and metoprolol tartrate  -increase Avapro to 300 mg daily Acute on  chronic renal failure  Renal function has now returned to normal with GFR >90  Hypernatremia  Resolved  Mild anemia, without acute blood loss  Hgb is essentially stable - no evidence of blood loss - follow trend  -check iron studies, B12, RBC folate  Purulent tracheobronchitis  doubt PNA in absence of fever or resp distress -finished 7 days levoflox on 8/11 -observe off abx -aggressive pulmonary toilet  DM2  -Reasonably well controlled at this time  -monitor CBG  -continue Lantus 40units  Dysphagia -Status post gastrostomy tube 05/27/2013 -Tolerating Glucerna at 70 cc per hour Code Status: NO CODE BLUE  Family Communication: spoke w/ husband at bedside  Disposition Plan: awaiting SNF placement (difficult as pt self pay) - denied by LTAC due to self-pay status - cont ongoing rehab efforts - family reports that they spoke to "one of the rehab doctors" and that they were told the pt "would be moving to the rehab unit once medically stable"  -have contacted CIR RN coordinators to revisit Consultants:  PCCM >> [facility]  Neurology - signed off  Procedures:  L radial A-line 7/10 >> 7/12  R IJ CVL 7/6 >> 7/21  ETT 7/6 >> 7/30  Trach (JY) 7/30 >> 8/6 cuffless #6 >>  G tube (IR) 8/1 >>  RUE PICC 8/1 >>  Antibiotics:  Levoflox 8/05 >> 8/11  DVT prophylaxis:  lovenox        Procedures/Studies: Mr Brain Wo Contrast  05/15/2013   *RADIOLOGY REPORT*  Clinical Data: 57 year old female with intracranial infarcts,. Most likely to be embolic on recent MRI.  Heart failure. Progression of disease.  MRI HEAD WITHOUT CONTRAST  Technique:  Multiplanar, multiecho pulse sequences of the brain and surrounding structures were obtained according to standard protocol without intravenous contrast.  Comparison: 05/03/2013 and earlier.  Findings: Interval decreased signal abnormality on trace diffusion in the right hemisphere, the most confluent areas of restricted diffusion on 05/03/2013 in both the right  MCA and right PCA territories continue to demonstrate confluent increased trace diffusion signal.  Small lacunar infarct at the left internal capsule posterior limb not significantly changed. Mild interval extension of a central pontine linear lacunar type infarct (pontine perforator artery territory) on series 3 image 9.  Elsewhere, the scattered punctate foci of restricted diffusion on 05/03/2013 have regressed, although several of the larger of these areas do persist (left anterior frontal lobe series 3 image 17).  Small volume petechial hemorrhage in the posterior superior right MCA territory now is evident on T2* imaging (series 8 image 18). There is also suggestion of trace petechial hemorrhage now in the posterior right temporal lobe (image 10).  No malignant hemorrhagic transformation.  No significant mass effect related to the persistent infarct.  On FLAIR imaging, cytotoxic edema in the hemispheres has mildly regressed. Only the signal abnormality in the central pons appears mildly increased.  No intraventricular hemorrhage or ventriculomegaly. Major intracranial vascular flow voids are stable. Basilar cisterns remain patent.  Negative pituitary, cervicomedullary junction visualized cervical spine.  Visualized orbit soft tissues are within normal limits.  Increased paranasal sinus and mastoid fluid and mucosal thickening. Increased fluid layering in the pharynx.  Negative scalp soft tissues.  Normal bone marrow signal.  IMPRESSION: 1.  Overall mildly regressed signal abnormality in the brain associated with multi focal infarcts.  The most confluent infarcts in the right posterior MCA and PCA territories do demonstrate persistent restricted diffusion, but less cytotoxic edema.  There is trace petechial hemorrhage, but no malignant hemorrhagic transformation or associated mass effect. 2.  Slight extension of a central pontine lacunar infarct (pontine perforator artery territory) without mass effect or  hemorrhage. 3.  No new vascular territories affected. No new intracranial abnormality.   Original Report Authenticated By: Erskine Speed, M.D.   Ir Gastrostomy Tube Mod Sed  05/27/2013   *RADIOLOGY REPORT*  Indication:  Dysphagia  PULL TROUGH GASTOSTOMY TUBE PLACEMENT  Comparison: Abdominal radiograph - earlier same day  Medications:  Versed 2 mg IV; Fentanyl 25 mcg IV; the patient is currently admitted to the hospital receiving intravenous antibiotics; Antibiotics were administered within 1 hour of the procedure.  Contrast volume:  20 mL Omnipaque-300 administered into the gastric lumen  Sedation time: 35 minutes  Fluoroscopy time: 3 minutes, 36 seconds  Complications: None immediate  PROCEDURE/FINDINGS:  Informed written consent was obtained from the patient's family following explanation of the procedure, risks, benefits and alternatives.  A time out was performed prior to the initiation of the procedure.  Maximal barrier sterile technique utilized including caps, mask, sterile gowns, sterile gloves, large sterile drape, hand hygiene and Betadine prep.  The left upper quadrant was sterilely prepped and draped.  An oral gastric catheter was inserted into the stomach under fluoroscopy. The existing nasogastric feeding tube was removed.  The left costal margin and barium opacified transverse colon were identified and avoided.  Air was injected into the stomach for insufflation and visualization under fluoroscopy.  Under sterile conditions a 17 gauge trocar needle was utilized to access the stomach percutaneously beneath the left subcostal margin after the overlying soft tissues were anesthetized with 1% Lidocaine with epinephrine.  Needle position  was confirmed within the stomach with aspiration of air and injection of small amount of contrast.   A single T tack was deployed for gastropexy.  Over an Amplatz guide wire, a 9-French sheath was inserted into the stomach.  A snare device was utilized to capture the oral  gastric catheter.  The snare device was pulled retrograde from the stomach up the esophagus and out the oropharynx.  The 20-French pull-through gastrostomy was connected to the snare device and pulled antegrade through the oropharynx down the esophagus into the stomach and then through the percutaneous tract external to the patient.  The gastrostomy was assembled externally.  Contrast injection confirms position in the stomach.   Several spot radiographic images were obtained in various obliquities for documentation.  The patient tolerated procedure well without immediate post procedural complication.  IMPRESSION:  Successful fluoroscopic insertion of a 20-French "pull-through" gastrostomy.   Original Report Authenticated By: Tacey Ruiz, MD   Dg Chest Port 1 View  06/01/2013   *RADIOLOGY REPORT*  Clinical Data: Respiratory failure  PORTABLE CHEST - 1 VIEW  Comparison: May 28, 2013  Findings: Tube and catheter positions are unchanged.  No pneumothorax.  There is patchy atelectasis in the bases, stable. No new opacity.  Heart size is upper normal with normal pulmonary vascularity.  No adenopathy.  IMPRESSION: Patchy bibasilar atelectatic change.  No change in the tube and catheter positions.  No pneumothorax.   Original Report Authenticated By: Bretta Bang, M.D.   Dg Chest Port 1 View  05/28/2013   *RADIOLOGY REPORT*  Clinical Data: Respiratory failure.  PORTABLE CHEST - 1 VIEW  Comparison: 05/27/2013  Findings: Tracheostomy tube and right PICC line are in place, unchanged.  Mild cardiomegaly.  Minimal bibasilar atelectasis.  No significant effusion.  No pneumothorax.  IMPRESSION: Bibasilar atelectasis.   Original Report Authenticated By: Charlett Nose, M.D.   Dg Chest Port 1 View  05/27/2013   *RADIOLOGY REPORT*  Clinical Data: Line placement  PORTABLE CHEST - 1 VIEW  Comparison: 05/26/2013  Findings: Right-sided central venous catheter tip is difficult to see but appears be within the SVC.  This  overlies the feeding tube.  Tracheostomy in good position.  Left lower lobe atelectasis, increased in the interval.  IMPRESSION: PICC tip difficult to see but appears to enter the SVC.  Increased left lower lobe atelectasis.   Original Report Authenticated By: Janeece Riggers, M.D.   Dg Chest Port 1 View  05/26/2013   *RADIOLOGY REPORT*  Clinical Data: Tracheostomy placement.  PORTABLE CHEST - 1 VIEW  Comparison: 05/25/2013.  Findings: Tracheostomy is present.  Feeding tube is also present with the tip not visualized.  Patient is rotated to the left. Allowing for rotation, there is no interval change.  Bilateral basilar atelectasis is present, prominent in the retrocardiac region.  No airspace disease/consolidation.  No pneumothorax. Monitoring leads are projected over the chest.  IMPRESSION: Unchanged support apparatus.  Basilar atelectasis.   Original Report Authenticated By: Andreas Newport, M.D.   Chest Portable 1 View To Assess Tube Placement And Rule-out Pneumothorax  05/25/2013   *RADIOLOGY REPORT*  Clinical Data: Tracheostomy tube placement  PORTABLE CHEST - 1 VIEW  Comparison: Same date.  Findings: Tracheostomy tube is seen in grossly good position with distal tip approximately 2 cm above the carina.  Dobbhoff tube is seen passing through esophagus into stomach.  No pneumothorax is noted.  No acute pulmonary disease is noted.  Bony thorax is intact.  IMPRESSION: Tracheostomy tube in grossly  good position.  No pneumothorax is noted.   Original Report Authenticated By: Lupita Raider.,  M.D.   Dg Chest Port 1 View  05/25/2013   *RADIOLOGY REPORT*  Clinical Data: Evaluate endotracheal tube position.  PORTABLE CHEST - 1 VIEW  Comparison: Chest x-ray 05/24/2013.  Findings: An endotracheal tube is in place with tip 3.8 cm above the carina. A feeding tube is seen extending into the abdomen, however, the tip of the feeding tube extends below the lower margin of the image.  Lung volumes are low.  The left  basilar opacity favored to represent subsegmental atelectasis.  No definite consolidative airspace disease.  Possible trace left pleural effusion.  No evidence of pulmonary edema.  Heart size and mediastinal contours are within normal limits allowing for patient rotation to the left.  Atherosclerosis of the thoracic aorta.  IMPRESSION: 1.  Support apparatus, as above. 2.  Persistent left lower lobe subsegmental atelectasis and trace left pleural effusion. 3.  Atherosclerosis.   Original Report Authenticated By: Trudie Reed, M.D.   Dg Chest Port 1 View  05/24/2013   *RADIOLOGY REPORT*  Clinical Data: Endotracheal tube placement  PORTABLE CHEST - 1 VIEW  Comparison: Prior chest x-ray 05/22/2013  Findings: Endotracheal tube is 3.5 cm above the carina.  The enteric feeding tube tip is not visualized but lies below the diaphragm, presumably within the stomach or proximal small bowel. Stable appearance of the chest with bilateral layering pleural effusions and associated bibasilar opacities.  Unchanged cardiomegaly.  No pneumothorax or acute osseous abnormality.  IMPRESSION: No significant interval change in the appearance of chest.   Original Report Authenticated By: Malachy Moan, M.D.   Dg Chest Port 1 View  05/22/2013   *RADIOLOGY REPORT*  Clinical Data: Check endotracheal tube.  PORTABLE CHEST - 1 VIEW  Comparison: 05/19/2013.  Findings: ETT ends at the level of the clavicular heads.  A feeding tube crosses the diaphragm.  Low lung volumes with vessel crowding, similar to previous.  No evidence of pneumothorax or significant effusion.  Normal heart size when accounting for respiratory volumes.  IMPRESSION:  1.  Good positioning of endotracheal and enteric tubes. 2.  Low lung volumes.   Original Report Authenticated By: Tiburcio Pea   Dg Chest Port 1 View  05/19/2013   *RADIOLOGY REPORT*  Clinical Data: Respiratory failure, follow-up  PORTABLE CHEST - 1 VIEW  Comparison: Portable chest x-ray of  05/14/2013  Findings: The tip of the endotracheal tube remains approximately 5.2 cm above the carina.  The lungs appear well aerated.  Mild cardiomegaly is stable.  IMPRESSION: Slightly better aeration.  No change in position of endotracheal tube.   Original Report Authenticated By: Dwyane Dee, M.D.   Dg Chest Port 1 View  05/14/2013   *RADIOLOGY REPORT*  Clinical Data: Evaluate endotracheal tube position  PORTABLE CHEST - 1 VIEW  Comparison: 05/13/2013  Findings: No change in the position of endotracheal tube with tip about 4.7 cm above the carina.  No change in position of right central line.  Heart size normal.  No abnormal pulmonary parenchymal opacities.  No change position of the NG tube.  IMPRESSION: Stable appearance of support devices.  Improved bilateral lower lobe aeration.   Original Report Authenticated By: Esperanza Heir, M.D.   Dg Chest Port 1 View  05/13/2013   *RADIOLOGY REPORT*  Clinical Data: Evaluate endotracheal tube position.  PORTABLE CHEST - 1 VIEW  Comparison: 05/11/2013 through 05/08/2013.  Findings: Endotracheal tube tip is 44 mm from  the carina.  Enteric tube is present with redundant loop in the proximal stomach.  Right IJ central line is unchanged with the tip at the cavoatrial junction.  Cardiopericardial silhouette appears similar.  Pulmonary vascular congestion.  Mild basilar airspace disease compatible with pulmonary edema.  Small right pleural effusion.  Compared yesterday's exam, the pulmonary aeration appears slightly worse. Retrocardiac density is present likely representing atelectasis, edema and effusion.  IMPRESSION:  1.  Stable support apparatus. 2.  Worsening pulmonary aeration compatible with developing pulmonary edema and small right pleural effusion.   Original Report Authenticated By: Andreas Newport, M.D.   Dg Chest Port 1 View  05/11/2013   *RADIOLOGY REPORT*  Clinical Data: Evaluate endotracheal tube position  PORTABLE CHEST - 1 VIEW  Comparison: Portable  chest x-ray of 05/10/2013  Findings:   The tip of the endotracheal tube is approximately 2.6 cm above the carina.  Mild basilar atelectasis has improved somewhat.  Mild cardiomegaly is stable.  The right central venous line tip overlies expected SVC - RA junction and an NG tube remains.  IMPRESSION:  1.  Slightly better aeration with mild basilar atelectasis and possible effusions. 2.  Tip of endotracheal tube 2.6 cm above the carina.   Original Report Authenticated By: Dwyane Dee, M.D.   Dg Chest Port 1 View  05/10/2013   *RADIOLOGY REPORT*  Clinical Data: 57 year old female with respiratory failure. Intubated.  PORTABLE CHEST - 1 VIEW  Comparison: 05/08/2013 and earlier.  Findings: AP portable semi upright view of the chest at 0547 hours. The patient is more rotated to the right.  Endotracheal tube tip is stable at the level of the clavicles.  Enteric tube courses to the abdomen and appears stable, looping in the gastric cardia region. Right IJ approach central line is stable.  Increased EKG leads and wires overlie the chest.  Bilateral veiling pulmonary opacities compatible with pleural effusions right greater than left.  These appear increased.  Increased retrocardiac atelectasis or consolidation.  Stable cardiac size and mediastinal contours.  No pneumothorax.  Continued pulmonary vascular congestion, not significantly changed.  IMPRESSION: 1.  Rotated. Stable lines and tubes. 2.  Increased appearance of bilateral pleural effusions and lower lobe collapse / consolidation. 3.  Stable vascular congestion.   Original Report Authenticated By: Erskine Speed, M.D.   Dg Abd Portable 1v  05/27/2013   *RADIOLOGY REPORT*  Clinical Data: Evaluate barium.  PORTABLE ABDOMEN - 1 VIEW  Comparison: 05/20/2013.  Findings: Weighted feeding tube is present with the tip in the antrum of the stomach.  Barium outlines the colon.  Small amount of residual barium is present within the small bowel.  Bowel gas pattern is  nonobstructive. The nasogastric tube appears to have been removed.  IMPRESSION: Barium outlines the colon.  Feeding tube tip in the antrum of the stomach.   Original Report Authenticated By: Andreas Newport, M.D.   Dg Abd Portable 1v  05/20/2013   *RADIOLOGY REPORT*  Clinical Data: Feeding tube placement  PORTABLE ABDOMEN - 1 VIEW  Comparison: May 07, 2013.  Findings: No abnormal bowel gas pattern is noted.  Dobbhoff tube tip is seen in expected position of the gastric body.  Nasogastric tube tip is seen in distal stomach.  IMPRESSION: No evidence of bowel obstruction or ileus.  Nasogastric tube tip seen in distal stomach.  Distal tip of Dobbhoff tube seen in body of stomach.   Original Report Authenticated By: Lupita Raider.,  M.D.  Subjective: Patient is awake and alert. Denies any fevers, chills, chest discomfort, shortness breath, vomiting, diarrhea, abdominal pain, dysuria.  Objective: Filed Vitals:   06/06/13 2234 06/06/13 2340 06/07/13 0329 06/07/13 0507  BP: 156/58   158/57  Pulse: 71 70 72 72  Temp: 98.7 F (37.1 C)   98.4 F (36.9 C)  TempSrc: Oral   Oral  Resp: 18 18 18 20   Height:      Weight:      SpO2: 98% 100% 98% 96%    Intake/Output Summary (Last 24 hours) at 06/07/13 1017 Last data filed at 06/06/13 1100  Gross per 24 hour  Intake      0 ml  Output      1 ml  Net     -1 ml   Weight change:  Exam:   General:  Pt is alert, follows commands appropriately, not in acute distress  HEENT: No icterus, No thrush, [place]/AT  Cardiovascular: RRR, S1/S2, no rubs, no gallops  Respiratory: diminished breath sounds at the bases without any wheezing. Good air movement.  Abdomen: Soft/+BS, non tender, non distended, no guarding  Extremities: 2+ edema, No lymphangitis, No petechiae, No rashes, no synovitis  Data Reviewed: Basic Metabolic Panel:  Recent Labs Lab 06/01/13 0420 06/04/13 0400 06/05/13 0415 06/07/13 0615  NA 143 137 135 137  K 3.6 4.2 4.3  4.1  CL 106 99 97 100  CO2 33* 34* 34* 33*  GLUCOSE 161* 170* 154* 159*  BUN 23 20 20 19   CREATININE 0.52 0.45* 0.44* 0.42*  CALCIUM 8.5 8.3* 8.4 8.5   Liver Function Tests:  Recent Labs Lab 06/06/13 0600  ALBUMIN 1.5*   No results found for this basename: LIPASE, AMYLASE,  in the last 168 hours No results found for this basename: AMMONIA,  in the last 168 hours CBC:  Recent Labs Lab 06/01/13 0420 06/05/13 0415 06/07/13 0615  WBC 6.8 7.5 8.7  HGB 7.5* 7.4* 7.8*  HCT 22.8* 22.2* 23.0*  MCV 91.2 90.2 89.8  PLT 295 263 232   Cardiac Enzymes: No results found for this basename: CKTOTAL, CKMB, CKMBINDEX, TROPONINI,  in the last 168 hours BNP: No components found with this basename: POCBNP,  CBG:  Recent Labs Lab 06/06/13 1609 06/06/13 2005 06/07/13 0012 06/07/13 0414 06/07/13 0917  GLUCAP 152* 155* 173* 136* 181*    Recent Results (from the past 240 hour(s))  CLOSTRIDIUM DIFFICILE BY PCR     Status: None   Collection Time    05/28/13  3:22 PM      Result Value Range Status   C difficile by pcr NEGATIVE  NEGATIVE Final  CULTURE, RESPIRATORY (NON-EXPECTORATED)     Status: None   Collection Time    05/31/13 12:56 PM      Result Value Range Status   Specimen Description TRACHEAL ASPIRATE   Final   Special Requests NONE   Final   Gram Stain     Final   Value: FEW WBC PRESENT,BOTH PMN AND MONONUCLEAR     NO SQUAMOUS EPITHELIAL CELLS SEEN     FEW GRAM POSITIVE COCCI     IN PAIRS FEW GRAM NEGATIVE RODS     RARE GRAM POSITIVE RODS   Culture     Final   Value: Non-Pathogenic Oropharyngeal-type Flora Isolated.     Performed at [facility]   Report Status 06/02/2013 FINAL   Final  MRSA PCR SCREENING     Status: None   Collection Time  06/02/13  5:10 PM      Result Value Range Status   MRSA by PCR NEGATIVE  NEGATIVE Final   Comment:            The GeneXpert MRSA Assay (FDA     approved for NASAL specimens     only), is one component of a      comprehensive MRSA colonization     surveillance program. It is not     intended to diagnose MRSA     infection nor to guide or     monitor treatment for     MRSA infections.     Scheduled Meds: . antiseptic oral rinse  1 application Mouth Rinse QID  . aspirin  325 mg Per Tube Daily  . chlorhexidine  15 mL Mouth/Throat BID  . enoxaparin (LOVENOX) injection  40 mg Subcutaneous Q24H  . feeding supplement  30 mL Per Tube Daily  . free water  100 mL Per Tube Q8H  . insulin aspart  0-20 Units Subcutaneous Q4H  . insulin glargine  40 Units Subcutaneous QHS  . irbesartan  300 mg Per Tube Daily  . lacosamide  200 mg Per Tube BID  . levETIRAcetam  1,000 mg Per Tube BID  . metoprolol tartrate  25 mg Per Tube BID  . phenytoin  150 mg Per Tube TID  . sodium chloride  10-40 mL Intracatheter Q12H  . spironolactone  25 mg Per Tube Daily   Continuous Infusions: . feeding supplement (GLUCERNA 1.2 CAL) 1,000 mL (06/05/13 1403)     Danielle Lento, DO  [facility] Pager [phone]  If 7PM-7AM, please contact night-coverage [email or web address] Password [facility] 06/07/2013, 10:17 AM   LOS: 36 days

## 2013-06-07 NOTE — Progress Notes (Signed)
Occupational Therapy Treatment Patient Details Name: Brenda Pope MRN: [id number] DOB: [birth year] Today's Date: 06/07/2013 Time: 4098-1191 OT Time Calculation (min): 50 min  OT Assessment / Plan / Recommendation  History of present illness Pt admitted with CVA.  Intubated 7/6 for airway protection following suspected seizure activity. Hypotensive post intubation with vasopressors.  MRI revealed small acute infarcts R parietal lobe. Pt has been comatose most of this hospitalization.7/20 - MRI brain: Overall mildly regressed signal abnormality in the brain associated with multi focal infarcts. The most confluent infarcts in the right posterior MCA and PCA territories do demonstrate persistent restricted diffusion, but less cytotoxic edema. There is trace petechial hemorrhage, but no malignant hemorrhagic transformation or associated mass effect. 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 The most confluent infarcts in the right posterior MCA and PCA territories do demonstrate persistent restricted diffusion, but less cytotoxic edema. There is trace petechial hemorrhage, but no malignant hemorrhagic transformation or associated mass effect. 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 The most confluent infarcts in the right posterior MCA and PCA territories do demonstrate persistent restricted diffusion, but less cytotoxic edema. There is trace petechial hemorrhage, but no malignant hemorrhagic transformation or associated mass effect. 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 The most confluent infarcts in the right posterior MCA and PCA territories do demonstrate persistent restricted diffusion, but less cytotoxic edema. There is trace petechial hemorrhage, but no malignant hemorrhagic transformation or associated mass effect. 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 The most confluent infarcts in the right posterior MCA and PCA territories do demonstrate persistent restricted diffusion, but less cytotoxic edema. Ther   OT comments  Pt following commands consistently.  Pt demonstrates ability to initiate movement during AAROM bil. UEs - see below for details; however, fatigues quickly.  Needs continued long term rehab at discharge.  Follow Up Recommendations  SNF    Barriers to Discharge       Equipment Recommendations  None recommended by OT    Recommendations for Other Services    Frequency Min 2X/week   Progress towards OT Goals Progress towards OT goals: Progressing toward goals  Plan Discharge plan remains appropriate    Precautions / Restrictions Precautions Precautions: Fall Precaution Comments: trach with 02 Restrictions Weight Bearing Restrictions: No   Pertinent Vitals/Pain     ADL  ADL Comments: Pt requires total A for all ADLs.  Pt. now demonstrates 1+/5 Rt. elbow flexion and 1/5 Rt elbow extension; 2/5 Rt shoulder shrug:   2-/5 lt elbow extension; 1+/5 Lt. elbow flexion and shoulder flexion; 2-/5 Lt finger flexion.  Pt performed 3 reps each movement and PROM bil. UE.  Pt moved to EOB sitting see below for comments    OT Diagnosis:    OT Problem List:   OT Treatment Interventions:     OT Goals(current  goals  can now be found in the care plan section) Acute Rehab OT Goals OT Goal Formulation: With patient/family Time For Goal Achievement: 06/16/13 Potential to Achieve Goals: Good ADL Goals Additional ADL Goal #1: Pt will sit EOB x 10 minutes with moderate assistance and head at midline. Additional ADL Goal #2: Pt will respond to questions and simple commands within 5 seconds of request.  Visit Information  Last OT Received On: 06/07/13 Assistance Needed: +2 History of Present Illness: Pt admitted with CVA.  Intubated 7/6 for airway protection following suspected seizure activity. Hypotensive post intubation with vasopressors.  MRI revealed small acute infarcts R parietal lobe. Pt has been comatose most of this hospitalization.7/20 - MRI brain: Overall mildly regressed signal abnormality in the brain associated with multi focal infarcts. The most confluent infarcts in the right posterior MCA and PCA territories do demonstrate persistent restricted diffusion, but less cytotoxic edema. There is trace petechial hemorrhage, but no malignant hemorrhagic transformation or associated mass effect. 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 The most confluent infarcts in the right posterior MCA and PCA territories do demonstrate persistent restricted diffusion, but less cytotoxic edema. There is trace petechial hemorrhage, but no malignant hemorrhagic transformation or associated mass effect. 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 The most confluent infarcts in the right posterior MCA and PCA territories do demonstrate  persistent restricted diffusion, but less cytotoxic edema. There is trace petechial hemorrhage, but no malignant hemorrhagic transformation or associated mass effect. 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 The most confluent infarcts in the right posterior MCA and PCA territories do demonstrate persistent restricted diffusion, but less cytotoxic edema. There is trace petechial hemorrhage, but no malignant hemorrhagic transformation or associated mass effect. 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 The most confluent infarcts in the right posterior MCA and PCA territories do demonstrate persistent restricted diffusion, but less cytotoxic edema. Ther    Subjective Data      Prior Functioning  Home Living Family/patient expects to be discharged to:: Skilled nursing facility Living Arrangements: Spouse/significant other;Other relatives    Cognition  Cognition Arousal/Alertness: Awake/alert Behavior During Therapy: Flat affect Overall Cognitive Status: Impaired/Different from baseline Orientation Level: Time Current Attention Level: Sustained Following Commands: Follows one step commands with increased time Problem Solving: Slow processing    Mobility  Bed Mobility Bed Mobility: Rolling Right;Rolling Left;Right Sidelying to Sit;Sit to Supine;Scooting to [facility] Rolling Right: 1: +2 Total assist Rolling Right: Patient Percentage: 0% Rolling Left: 1: +2 Total assist Rolling Left: Patient Percentage: 0% Right Sidelying to Sit: 1: +2 Total assist Right Sidelying to Sit: Patient Percentage: 0% Sit to Supine:  1: +2 Total assist Sit to Supine: Patient Percentage: 0% Scooting to HOB: 1: +2 Total assist Scooting to [facility]: Patient Percentage: 0% Details for Bed Mobility Assistance: Very little initiation and profoundly weak large muscle  groups and trunK    Exercises  General Exercises - Upper Extremity Shoulder Flexion: PROM;Both;Supine;5 reps Shoulder ABduction: PROM;Both;5 reps;Supine Elbow Flexion: PROM;Both;5 reps;Supine Wrist Flexion: PROM;Both;10 reps;Supine Wrist Extension: PROM;Both;5 reps;Supine General Exercises - Lower Extremity Heel Slides: PROM;10 reps;Both;Supine Hand Exercises Digit Composite Flexion: PROM;Left;Both;5 reps;Supine Composite Extension: PROM;Both;5 reps;Supine Thumb Abduction: PROM;Both;5 reps;Supine Thumb Adduction: PROM;5 reps;Supine Other Exercises Other Exercises: AA/PROM to bil LE's  (active assist to R LE at hip abd/ ER, ankle df/pf)   Balance Balance Balance Assessed: Yes Static Sitting Balance Static Sitting - Balance Support: Feet supported;No upper extremity supported (UE's unable to assist, but were supported.) Static Sitting - Level of Assistance: 1: +2 Total assist;Patient percentage (comment) Static Sitting - Comment/# of Minutes: 8 min, pt needing 100% assist; trying to elicit truncal/cervical activation.   End of Session OT - End of Session Activity Tolerance: Patient limited by fatigue Patient left: in bed;with call bell/phone within reach;with family/visitor present Nurse Communication: Other (comment) (need for suction)  GO     Shya Kovatch M 06/07/2013, 5:35 PM

## 2013-06-08 DIAGNOSIS — Z930 Tracheostomy status: Secondary | ICD-10-CM

## 2013-06-08 LAB — GLUCOSE, CAPILLARY
Glucose-Capillary: 165 mg/dL — ABNORMAL HIGH (ref 70–99)
Glucose-Capillary: 187 mg/dL — ABNORMAL HIGH (ref 70–99)

## 2013-06-08 NOTE — Progress Notes (Signed)
Rehab admissions - Currently patient is unable to tolerate intense inpatient rehab therapies.  She is requiring total assist +2 contributing 0% to her therapy.  I will continue to follow for improvement in therapy participation.  Call me for questions.  #[phone]

## 2013-06-08 NOTE — Progress Notes (Signed)
Passy-Muir Speaking Valve - Treatment Patient Details  Name: Brenda Pope MRN: [id number] Date of Birth: 1956/07/09  Today's Date: 06/08/2013 Time: 6440-3474 SLP Time Calculation (min): 22 min;   Past Medical History:  Past Medical History  Diagnosis Date  . Stroke   . Diabetes mellitus without complication   . Hypertension   . CHF (congestive heart failure)    Past Surgical History: History reviewed. No pertinent past surgical history.  Assessment / Plan / Recommendation Clinical Impression  Pt wore PMSV for 15 minutes with continuing improvements in quality of phonation; respiratory coordination impaired for increased length-of-utterance.  VS remained stable during use; pt verbalizes discomfort in throat with use,  and her belief that PMSV tires her.  Encouraged her to wear valve for 15 minute intervals; discussed with spouse the benefits of valve for restoration of oral secretion management. Pt with deficits in sustained/selective attention, requiring mod cues to persist through task; pt with awareness of difficulty with task, saying "I'm getting confused."  Overall, from speech and cognitive perspective, pt is making gains with improved initiation, attention, and vocal quality.      Plan  Continue with current plan of care           Pertinent Vitals/Pain Discomfort in throat - valve removed     SLP Goals SLP Goal #1: Pt will selectively attend to target stimuli in quiet environment for ten minutes with 80% accuracy. SLP Goal #1 - Progress: Progressing toward goal SLP Goal #2: Pt will use alternative methods to gain attention/call staff when valve not in place 3x per session. SLP Goal #2 - Progress: Progressing toward goal SLP Goal #3: Pt will utilize upper airway for cough/phonation with max cues and stable VS with PMSV in place SLP Goal #3 - Progress: Progressing toward goal SLP Goal #4: Pt will use PMSV and increase intelligibility to 90% with appropriate respiratory pacing.   SLP Goal #4 - Progress: Progressing toward goal   PMSV Trial  PMSV was placed for: 15 minutes Able to redirect subglottic air through upper airway: Yes Able to Attain Phonation: Yes Voice Quality: Aphonic;Hoarse;Low vocal intensity Able to Expectorate Secretions: No Breath Support for Phonation: Inadequate Intelligibility: Intelligible Word: 75-100% accurate Phrase: 75-100% accurate Sentence: 75-100% accurate SpO2 During Trial: 99 % Pulse During Trial: 76 Behavior: Cooperative;Responsive to questions      Vent Dependency  FiO2 (%): 28 % (Simultaneous filing. User may not have seen previous data.)         Chasiti Waddington L. Samson Frederic, [place] CCC/SLP Pager [phone]     [name] 06/08/2013, 3:30 PM

## 2013-06-08 NOTE — Progress Notes (Signed)
Rehab admissions - I met with Mr. Bittman, patient's husband.  He is angry and frustrated.  He expected patient to be up on inpatient rehab on this past Monday.  I tried to explain that patient cannot tolerate inpatient rehab therapies and that PT and OT are recommending SNF.  He then yelled about therapists and the lack of therapy exercise for his wife.  I read sections from Dr. Wynn Banker consult to him and I read parts of therapy notes, but he had difficulty accepting anything I had to say.  He then became tearful talking about how he and his wife met in church and how much he loved her.  He is distraught over the events of the past month.  I offered emotional support.  I have talked to Dr. Wynn Banker about all of the husbands concerns.  Call me for questions.  #[phone]

## 2013-06-08 NOTE — Progress Notes (Signed)
[facility] PROGRESS NOTE  Brenda Pope AOZ:[id number] DOB: 08-31-1956 DOA: 05/02/2013 PCP: Bobbye Riggs, NP    Brief Narrative  57 y/o with past medical history of CVAs/TIAs and diastolic heart failure, severe baseline hypertension with noncompliance, poorly controlled diabetes who was admitted to [facility] on 7/5 with acute dyspnea and suspected CHF exacerbation. On the day of admission she developed neurological findings consistent with acute CVA. On 7/6 she required intubated for airway protection during suspected seizure activity. She became hypotensive post intubation requiring vasopressors. MRI confirmed small acute infarcts of the right parietal lobe. On 7/7 she was transferred to [facility] for further management per the PCCM service. Transferred to [facility] 05/31/13, then transferred to Neuroscience floor 06/05/13.   SIGNIFICANT EVENTS / STUDIES:  7/05 - Admitted to [facility] with CHF exacerbation; developed stroke symptoms  7/06 - Suspected seizure activity, intubated for airway protection; hypotensive post intubation requiring vasopressors  7/06 - Brain MRI: Multiple small acute infarcts R parietal lobe, possible small infarcts left basal ganglia and central pons  7/06 - TTE: No source of thrombus/CVA, EF 55-60%  7/06 - Carotid Doppler: No evidence of hemodynamically significant stenosis  7/07 - Transferred to [facility] - PCCM serivce  7/07 - Neuro consult: "Concern patient is in SE in the context of recent cortical stroke."  7/08 - MRI brain: Confluent and scattered right MCA and right PCA acute infarcts, plus scattered small left hemisphere and occasional posterior fossa acute infarcts. The vast majority of these infarcts are newly seen since 05/01/2013. This appearance suggests embolic phenomena which severely affected more proximal/medium-sized vessels of the right MCA and PCA territories. Cytotoxic edema without significant mass effect at this time. No associated hemorrhage  7/08 -  EEG: severe diffuse encephalopathic process with asymmetrical involvement. Findings reported from the left hemisphere were nonspecific. Findings record from the right hemisphere were consistent with acute large area of infarction. No frank epileptiform activity was recorded. PLEDs not considered epileptic, most often seen with acute large hemispheric strokes  7/09 - EEG: consistent with recurrent seizures which amount to partial status epilepticus in the right hemisphere.  7/10 - LP performed by Neuro  7/10 - Burst suppression with propofol. Norepi initiated to prevent propofol induced hypotension. Consideration of primary CNS vasculitis. Serologies ordered>> CRP, ESR markedly elevated. High dose methylpred ordered. Insulin gtt ordered while on high dose steriods  7/10 - propofol ineffective at burst suppression. pentobarbitol initiated  7/11 - CT head: Right frontal lobe, right parietal lobe, right occipital lobe and posterior right temporal lobe acute/subacute infarcts. Surrounding edema with local mass effect. Minimal petechial hemorrhage suspected particularly along the superior margin of the infarct. No gross hemorrhage noted. No midline shift 7/11 - angiogram eval for vasculitis: smooth focal areas of narrowing of prox PCAs and distal pericallosal. Subcortical branches, suspicious of vasculitis.  7/14 - TEE: no evidence of thrombus  7/20 - MRI brain: Overall mildly regressed signal abnormality in the brain associated with multi focal infarcts. The most confluent infarcts in the right posterior MCA and PCA territories do demonstrate persistent restricted diffusion, but less cytotoxic edema. There is trace petechial hemorrhage, but no malignant hemorrhagic transformation or associated mass effect. Slight extension of a central pontine lacunar infarct (pontine perforator artery territory) without mass effect or hemorrhage  7/21 - EEG: severe encephalopathy, nonspecific as to cause. No electrography  seizures noted.  7/22 - Family indicated that they did not wish to pursue trach/G-tube and SNF. However, they were not ready to "give up"  yet citing their concern that all of the meds used to induced coma might not have fully washed out of her system yet. Noted that she had developed severe hypernatremia. Free water increased. Made DNR in event of cardiac arrest  7/28 - pt following commands, family is now considering Trach and rehab for patient. Husband to decide in the coming days.  7/30 - Trach completed by Dr. 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 She received pulse dose steroid initially. W/up including CSF analysis and imaging indicating atherosclerosis rather than vasculitis. no further studies to offer at this time  -neurology signed off on 06/02/2013  -Continue aspirin  -has residual left sided weakness. PT following  Status epilepticus  Resolved.cont triple AEDs for now   Persistent coma/acute encephalopathy  Secondary to above. Now  resolved   Acute respiratory failure due to AMS - s/p tracheostomy per PCCM  -Changed to 6 cuffless per PCCM  - progress PMV when stronger. will need trach clinic f/u in 6 wks after discharge  -clinically stable on trach collar   HTN  -continue Aldactone, Avapro, and metoprolol tartrate   Acute on chronic renal failure  Resolved  Hypernatremia  Resolved   Mild anemia,  Currently stable  Purulent tracheal secretions No  fever or resp distress  Completed 7 days levoflox on 8/11  -aggressive pulmonary toilet   DM2  -stable -monitor CBG  -continue Lantus 40units   Dysphagia  -Status post gastrostomy tube 05/27/2013  -Tolerating Glucerna at 70 cc per hour   Code Status: DNR Family  Communication: spoke w/ husband and brotherat bedside  Disposition Plan:  Refused by LTAC given insurance issues. CIR evaluated patient and considered inpt rehab if she was able to sit up for an hour and participate in therapy. She is hardly able to sit up. Husband quite hostile with rehab MD saying " i was promised by the MD that patient will go to inpatient rehab early this week". No SNF available that can take pt with trach. patient may eventually need to go home with [facility], but is not clinically ready yet. Husband and family need a lot of teaching on trach care, feeding tube and positioning of patient. She will need active rehab in order to improve.   discharging her home is the last and least option i can think of. she is not clinically stable to be discharged home at this time and needs periodic clinical assessment before she is safe to be discharge home. Husband doesnot have enough help or resources to take care fo her at home at present.  Consultants:  PCCM >> [facility]  Neurology - signed off  Procedures:  L radial A-line 7/10 >> 7/12  R IJ CVL 7/6 >> 7/21  ETT 7/6 >> 7/30  Trach (JY) 7/30 >> 8/6 cuffless #6 >>  G tube (IR) 8/1 >>  RUE PICC 8/1 >>  Antibiotics:  Levoflox 8/05 >> 8/11  DVT prophylaxis:  lovenox    Objective: Filed Vitals:   06/08/13 1700  BP: 154/68  Pulse: 75  Temp: 98 F (36.7 C)  Resp: 19    Intake/Output Summary (Last 24 hours) at 06/08/13 1936 Last data filed at 06/08/13 1900  Gross per  24 hour  Intake 2586.83 ml  Output      0 ml  Net 2586.83 ml   Filed Weights   05/29/13 0400 05/30/13 0500 05/31/13 0100  Weight: 122.6 kg (270 lb 4.5 oz) 123 kg (271 lb 2.7 oz) 124.2 kg (273 lb 13 oz)    Exam:   General:  Middle aged obese female lying in bed in no acute distress  HEENT: No pallor, trach collar in place, moist mucosa  Chest: Diminished breath sounds bilaterally, plantar sounds  CVS: Normal S1 and S2, no murmurs rub or  gallop  Abdomen: Soft, nontender, nondistended, bowel sounds present, G-tube in place  Extremities: Trace edema, able to move her right extremity  CNS: Alert and awake, left hemiplegia     Data Reviewed: Basic Metabolic Panel:  Recent Labs Lab 06/04/13 0400 06/05/13 0415 06/07/13 0615  NA 137 135 137  K 4.2 4.3 4.1  CL 99 97 100  CO2 34* 34* 33*  GLUCOSE 170* 154* 159*  BUN 20 20 19   CREATININE 0.45* 0.44* 0.42*  CALCIUM 8.3* 8.4 8.5   Liver Function Tests:  Recent Labs Lab 06/06/13 0600  ALBUMIN 1.5*   No results found for this basename: LIPASE, AMYLASE,  in the last 168 hours No results found for this basename: AMMONIA,  in the last 168 hours CBC:  Recent Labs Lab 06/05/13 0415 06/07/13 0615  WBC 7.5 8.7  HGB 7.4* 7.8*  HCT 22.2* 23.0*  MCV 90.2 89.8  PLT 263 232   Cardiac Enzymes: No results found for this basename: CKTOTAL, CKMB, CKMBINDEX, TROPONINI,  in the last 168 hours BNP (last 3 results) No results found for this basename: PROBNP,  in the last 8760 hours CBG:  Recent Labs Lab 06/07/13 2327 06/08/13 0350 06/08/13 0748 06/08/13 1220 06/08/13 1645  GLUCAP 160* 169* 138* 165* 187*    Recent Results (from the past 240 hour(s))  CULTURE, RESPIRATORY (NON-EXPECTORATED)     Status: None   Collection Time    05/31/13 12:56 PM      Result Value Range Status   Specimen Description TRACHEAL ASPIRATE   Final   Special Requests NONE   Final   Gram Stain     Final   Value: FEW WBC PRESENT,BOTH PMN AND MONONUCLEAR     NO SQUAMOUS EPITHELIAL CELLS SEEN     FEW GRAM POSITIVE COCCI     IN PAIRS FEW GRAM NEGATIVE RODS     RARE GRAM POSITIVE RODS   Culture     Final   Value: Non-Pathogenic Oropharyngeal-type Flora Isolated.     Performed at [facility]   Report Status 06/02/2013 FINAL   Final  MRSA PCR SCREENING     Status: None   Collection Time    06/02/13  5:10 PM      Result Value Range Status   MRSA by PCR NEGATIVE  NEGATIVE  Final   Comment:            The GeneXpert MRSA Assay (FDA     approved for NASAL specimens     only), is one component of a     comprehensive MRSA colonization     surveillance program. It is not     intended to diagnose MRSA     infection nor to guide or     monitor treatment for     MRSA infections.     Studies: No results found.  Scheduled Meds: . antiseptic oral rinse  1  application Mouth Rinse QID  . aspirin  325 mg Per Tube Daily  . chlorhexidine  15 mL Mouth/Throat BID  . enoxaparin (LOVENOX) injection  40 mg Subcutaneous Q24H  . feeding supplement  30 mL Per Tube Daily  . free water  100 mL Per Tube Q8H  . insulin aspart  0-20 Units Subcutaneous Q4H  . insulin glargine  40 Units Subcutaneous QHS  . irbesartan  300 mg Per Tube Daily  . lacosamide  200 mg Per Tube BID  . levETIRAcetam  1,000 mg Per Tube BID  . metoprolol tartrate  25 mg Per Tube BID  . phenytoin  150 mg Per Tube TID  . sodium chloride  10-40 mL Intracatheter Q12H  . spironolactone  25 mg Per Tube Daily   Continuous Infusions: . feeding supplement (GLUCERNA 1.2 CAL) 1,000 mL (06/08/13 1113)      Time spent: 35 minutes    Thaison Kolodziejski  [facility] Pager [phone] If 7PM-7AM, please contact night-coverage at [email or web address], password [facility] 06/08/2013, 7:36 PM  LOS: 37 days

## 2013-06-08 NOTE — Care Management Note (Addendum)
Page 1 of 2   06/17/2013     5:24:44 PM   CARE MANAGEMENT NOTE 06/17/2013  Patient:  Brenda Pope, Brenda Pope   Account Number:  [id number]  Date Initiated:  05/03/2013  Documentation initiated by:  Carlyle Lipa  Subjective/Objective Assessment:   CVA with seizures; vent for airway protection; low BP-on Levo briefly     Action/Plan:   await medical stability to determine d/c dispo   Anticipated DC Date:  06/10/2013   Anticipated DC Plan:  SKILLED NURSING FACILITY      DC Planning Services  CM consult  MATCH Program      Choice offered to / List presented to:  C-3 Spouse      DME agency  [facility]        [facility] agency  [facility]   Status of service:  Completed, signed off Medicare Important Message given?   (If response is "NO", the following Medicare IM given date fields will be blank) Date Medicare IM given:   Date Additional Medicare IM given:    Discharge Disposition:  HOME W HOME HEALTH SERVICES  Per UR Regulation:  Reviewed for med. necessity/level of care/duration of stay  If discussed at Long Length of Stay Meetings, dates discussed:   05/10/2013  05/12/2013  05/17/2013  05/24/2013  05/31/2013  06/02/2013    Comments:  06/17/13 Elmer Bales RN, MSN, CM- Spoke with patient's husband via phone to get permission to enroll patient in a [employer] assistance program.  Scripts and information faxed to the [facility] outpatient pharmacy.  Patient's husband is aware that medications are being processed there.   06/16/13 1715 Courtney Clearence Cheek RN, MSN, CM-  Met with patient's family in the room and spoke with patient's husband via phone regarding the need to stay overnight due to equipment delay.  Family states they are in agreement with this plan. Charge RN was notified.   06/16/13 1645 Courtney Robarge RN, MSN, CM- MATCH information along with paper scripts for 34 days were given to patient's family. CM recieved a call from [facility] with [facility] notifying that patient  needs to stay until tomorrow morning due to difficulty obtaining the proper oxygen concentrator. Dr Benjamine Mola was text paged regarding this issue.   06/16/13 1015 Elmer Bales RN, MSN, CM-  Met with patient's husband and CSW Chiropodist at length to discuss why patient cannot be discharged to SNF since bed offer was refused.  CSW explained the specifics of why patient cannot go to other SNFs that husband has requested. CM spoke with husband regarding the need to speak with [facility] as soon as possible to set up appointment for equipment delivery. Patient's husband verbalized understanding. MATCH program used for medications.   06/10/13 1200 Elmer Bales RN, MSN, CM-  Met with Hilda Lias with [facility] and patient's husband to discuss home health and equipment needs.  Written information detailing orders, including trach/PEG orders as noted on 06/09/13 were given to Dr Gonzella Lex, who is aware that [facility] will need 24 hrs notice prior to discharge.   06/10/13 1000 Elmer Bales RN, MSN, CM-  Spoke with Hilda Lias with [facility], who will be up to speak with patient's husband.   06/09/13 1115 Elmer Bales RN, MSN, CM- Spoke with Judeth Cornfield at [facility] to discuss specifics for home health arrangements at discharge.  CM then spoke with Darian with [facility] who states that it will require 24 hr advanced notice.  Specifics:  Physician will need to order a trach  of the current size as well as 1 size smaller.  It will need to be noted whether trach is disposable or reusable, cuffed or uncuffed.  They will need to know the cap and sleeve size and whether heated humidity is needed as well as any oxygen needs. [facility] is able to do continuous tube feeding for this patient, and will just need to know the rate and formula.  Any necessary home equipment needs will need to be ordered.  CM will notify Dr Gonzella Lex of specific requirements.  06/08/13 1545 Elmer Bales RN, MSN, CM-  Met with CSW and patient's' husband with  additional family/friends at bedside. CSW made patient's husband aware that SNF bed offer has been revoked.  CM spoke with husband about the plan to have staff do teaching regarding trach and PEG care. CM again discussed bringing the [facility] liason in to discuss home health.  Will continue to follow.  06/08/13 1020- Elmer Bales RN, MSN, CM- Met with CSW and patient's husband regarding discharge planning options. [name] is refusing [facility], which is the only bed offer available.  Home health was discussed and CM offered to have the [facility] liason speak with him regarding the financials since patient is self-pay.  CM spoke with Dr Windy Canny at length regarding patient's husband's concerns and refusal to take patient anywhere except CIR. Information was also shared with Genie, CIR liason, who will be speaking with patient's husband at this time.   06/02/13- 1030 - Donn Pierini RN, BSN [phone] Call made to both [facility] and [facility] to check on LTACH option for discharge- per [facility] they would be unable to offer a bed at any time- per [facility] they do not currently have a bed available under a charity case (pt is self pay with medicaid pending) LTACH at this time is not an option - CSW still working on SNF bed with [facility]-  05/30/2013 Carlyle Lipa, RN BSN [facility] CCM 1022--Pt on trach collar in ICU but has stepdown unit orders. PEG to be placed today. Will need SNF. Medicaid for [place] is in process.  05/23/2013 Carlyle Lipa, RN BSN [facility] CCM 0938--Pt remains on vent in ICU. Trach planned for this week. Likely to need PEG too but that not mentioned in notes. Local Medicaid started and Financial Counselor has all necessary info to do [place] medicaid if needed. Family is hopeful for inpatient rehab.  05/17/2013 Carlyle Lipa, RN BSN [facility] CCM 1102--Pt remains on vent in ICU. Remains comatose. Family meeting this am with husband, daughter and all of her brothers/sisters. Discussed code status  and options of aggressive care (trach/PEG) vs comfort care. Family is discussing.  05/13/2013 Carlyle Lipa, RN BSN [facility] CCM 1033--Remains on vent in ICU. Remains comatose. Does have (+)gag. Considering trach next week if no improvement.  05/10/2013 Carlyle Lipa, RN BSN [facility] CCM 1033--Pt was in pentobarb coma until 7/13.  MD feels that this will take until at least 7/17 before it is out of her system and they can see what her mental status is.

## 2013-06-08 NOTE — Progress Notes (Signed)
Occupational Therapy Treatment Patient Details Name: Brenda Pope MRN: [id number] DOB: 03-28-1956 Today's Date: 06/08/2013 Time: 4696-2952 OT Time Calculation (min): 56 min  OT Assessment / Plan / Recommendation  History of present illness Pt admitted with CVA.  Intubated 7/6 for airway protection following suspected seizure activity. Hypotensive post intubation with vasopressors.  MRI revealed small acute infarcts R parietal lobe. Pt has been comatose most of this hospitalization.7/20 - MRI brain: Overall mildly regressed signal abnormality in the brain associated with multi focal infarcts. The most confluent infarcts in the right posterior MCA and PCA territories do demonstrate persistent restricted diffusion, but less cytotoxic edema. There is trace petechial hemorrhage, but no malignant hemorrhagic transformation or associated mass effect. 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 The most confluent infarcts in the right posterior MCA and PCA territories do demonstrate persistent restricted diffusion, but less cytotoxic edema. There is trace petechial hemorrhage, but no malignant hemorrhagic transformation or associated mass effect. 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 The most confluent infarcts in the right posterior MCA and PCA territories do demonstrate persistent restricted diffusion, but less cytotoxic edema. There is trace petechial hemorrhage, but no malignant hemorrhagic transformation or associated mass effect. 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 The most confluent infarcts in the right posterior MCA and PCA territories do demonstrate persistent restricted diffusion, but less cytotoxic edema. There is trace petechial hemorrhage, but no malignant hemorrhagic transformation or associated mass effect. 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 The most confluent infarcts in the right posterior MCA and PCA territories do demonstrate persistent restricted diffusion, but less cytotoxic edema. Ther   OT comments  Pt demonstrates improved Rt UE movement today, and was able to wipe mouth/nose with max A.  Pt able to count backwards from 100-80 independently then max cues from 80-0.   Husband has been performing AAROM and PROM - encouraged his efforts and encouraged him to continue  Follow Up Recommendations  SNF    Barriers to Discharge       Equipment Recommendations  None recommended by OT    Recommendations for Other Services    Frequency Min 2X/week   Progress towards OT Goals Progress towards OT goals: Progressing toward goals  Plan Discharge plan remains appropriate    Precautions / Restrictions Precautions Precautions: Fall Precaution Comments: trach with 02 Restrictions Weight Bearing Restrictions: No   Pertinent Vitals/Pain     ADL  Grooming: Wash/dry face;Maximal assistance Where Assessed - Grooming: Supine, head of bed up ADL Comments: Pt initially lethargic, and reports "I am trying to finish my nap".  with max cues, pt arroused.  Consistent, spontaneous movement noted throughout Rt. UE today.  Pt intermittently assisted with AAROM - pt easily distracts.  Lt. UE is grossly 1+/5 - 2-/5.   No movement today Lt. UE, but pt fatigued at  end of session.  SLP in during session and pt with PMV in place.  She was able to count backwards from 100-80 by 5's independently, but then required max verbal cues to complete 80-0.  Husband has been encouraging pt to move extremities and encouraged him to continue daily.  Long discussion with him re: expected progression.  Reinforced to him that she is unable to tolerate 3 hours of therapy at this time as required by CIR.      OT Diagnosis:    OT Problem List:   OT Treatment Interventions:     OT Goals(current goals can now be found in the care plan section) Acute Rehab OT Goals Patient Stated Goal: husband is seeking post acute rehab and eventual return home OT Goal Formulation: With patient/family ADL Goals Additional ADL Goal #1: Pt will sit EOB x 10 minutes with moderate assistance and head at midline. Additional ADL Goal #2: Pt will respond to questions and simple commands within 5 seconds of request.  Visit Information  Last OT Received On: 06/08/13 Assistance Needed: +2 History of Present Illness: Pt admitted with CVA.  Intubated 7/6 for airway protection following suspected seizure activity. Hypotensive post intubation with vasopressors.  MRI revealed small acute infarcts R parietal lobe. Pt has been comatose most of this hospitalization.7/20 - MRI brain: Overall mildly regressed signal abnormality in the brain associated with multi focal infarcts. The most confluent infarcts in the right posterior MCA and PCA territories do demonstrate persistent restricted diffusion, but less cytotoxic edema. There is trace petechial hemorrhage, but no malignant hemorrhagic transformation or associated mass effect. 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 The most  confluent infarcts in the right posterior MCA and PCA territories do demonstrate persistent restricted diffusion, but less cytotoxic edema. There is trace petechial hemorrhage, but no malignant hemorrhagic transformation or associated mass effect. 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 The most confluent infarcts in the right posterior MCA and PCA territories do demonstrate persistent restricted diffusion, but less cytotoxic edema. There is trace petechial hemorrhage, but no malignant hemorrhagic transformation or associated mass effect. 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 The most confluent infarcts in the right posterior MCA and PCA territories do demonstrate persistent restricted diffusion, but less cytotoxic edema. There is trace petechial hemorrhage, but no malignant hemorrhagic transformation or associated mass effect. 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 The most confluent infarcts in the right posterior MCA and PCA territories do demonstrate persistent restricted diffusion, but less cytotoxic edema. Ther    Subjective Data      Prior Functioning       Cognition  Cognition Arousal/Alertness: Awake/alert Behavior During Therapy: Flat affect Overall Cognitive Status: Impaired/Different from baseline Area of Impairment: Attention;Following commands;Awareness;Problem solving Current Attention Level:  Selective Following Commands: Follows one step commands consistently;Follows multi-step commands inconsistently Safety/Judgement: Decreased  awareness of deficits Problem Solving: Slow processing;Requires verbal cues    Mobility       Exercises  General Exercises - Upper Extremity Shoulder Flexion: AAROM;Right;5 reps;Supine Shoulder ABduction: AAROM;Right;5 reps;Supine Elbow Flexion: AAROM;Right;5 reps;Supine Wrist Flexion: PROM;Both;5 reps;Supine Wrist Extension: PROM;Right;5 reps;Supine Hand Exercises Digit Composite Flexion: AAROM;5 reps;Supine;Right Composite Extension: AAROM;Right;5 reps;Supine Thumb Abduction: PROM;Both;5 reps Thumb Adduction: PROM;Both;Supine   Balance     End of Session OT - End of Session Activity Tolerance: Patient limited by fatigue Patient left: in bed;with call bell/phone within reach;with family/visitor present  GO     Joakim Huesman, Ursula Alert M 06/08/2013, 5:09 PM

## 2013-06-08 NOTE — Progress Notes (Addendum)
Patient ID: Brenda Pope, female   DOB: 1956/07/12, 57 y.o.   MRN: [id number]  PM&R follow up note  Asked by rehab admission RN to speak to husband regarding rehab recommendations. I sat down to talk to Mr. Gwendolyn Grant. Husband stated that I promised him a bed on inpt rehab unit on 8/11.  Also that I would write "happy notes" in her chart so that she could come to rehab.   I stated this was not correct.  He then stood up and walked towards me angrily. He threatened report me to the medical board. Family told him to calm down.  I clarified that rehab admission team would reassess participation with therapy on 8/11 not admission.  Mr. Rubalcava told me to leave the room, which I did. I have reviewed therapy notes, 0% participation with mobility.  Unable to sit more that 10 minutes.  Rehab admission team can follow while pt at [facility] to monitor activity tolerance.   I spoke to [facility] MD Dr. Gonzella Lex about the above.

## 2013-06-09 LAB — GLUCOSE, CAPILLARY
Glucose-Capillary: 130 mg/dL — ABNORMAL HIGH (ref 70–99)
Glucose-Capillary: 157 mg/dL — ABNORMAL HIGH (ref 70–99)
Glucose-Capillary: 164 mg/dL — ABNORMAL HIGH (ref 70–99)

## 2013-06-09 NOTE — Clinical Social Work Note (Signed)
CSW met with pt and pt's husband at bedside with Case Management. [name] was informed that pt's one SNF option had been resent and the discharge option was home. [name] was agreeable to having pt come home once pt's husband had been educated on caring for pt by [facility] staff. CSW signing off. Please re-consult if new concerns arise.  Darlyn Chamber, MSW, LCSWA Clinical Social Work [phone]

## 2013-06-09 NOTE — Progress Notes (Signed)
Passy-Muir Speaking Valve - Treatment Patient Details  Name: Carleigh Buccieri MRN: [id number] Date of Birth: [birth year]  Today's Date: 06/09/2013 Time: 4098-1191 SLP Time Calculation (min): 32 min  Past Medical History:  Past Medical History  Diagnosis Date  . Stroke   . Diabetes mellitus without complication   . Hypertension   . CHF (congestive heart failure)    Past Surgical History: History reviewed. No pertinent past surgical history.  Assessment / Plan / Recommendation Clinical Impression  Continued f/u for PMSV and cognition.  Pt with improved response time today, noted with more spontaneous head turns to left and right to make eye contact with speaker.  Toleration of valve continues to improve - pt describes discomfort wearing valve, but she is amenable to using it with encouragement.  Sp02 at 99%, HR at 76 with valve in place.  Cognitive tasks focuses on attention and working memory.  Pt with mod assist for verbal tasks of sustained and selective attention.  Verbal sequencing with min cues to include all steps.   Recalled 5/5 tasks after 30 min session.  Pt making gains cognitively and with speech.  Continue to follow.    Plan  Continue with current plan of care    Follow Up Recommendations    SNF vs HHSLP       SLP Goals Potential to Achieve Goals: Good SLP Goal #1: Pt will selectively attend to target stimuli in quiet environment for ten minutes with 80% accuracy. SLP Goal #1 - Progress: Progressing toward goal SLP Goal #2: Pt will use alternative methods to gain attention/call staff when valve not in place 3x per session. SLP Goal #2 - Progress: Met SLP Goal #3: Pt will utilize upper airway for cough/phonation with max cues and stable VS with PMSV in place SLP Goal #3 - Progress: Other (comment) SLP Goal #4: Pt will use PMSV and increase intelligibility to 90% with appropriate respiratory pacing.  (new goal for min assist) SLP Goal #4 - Progress: Progressing toward  goal SLP Goal #5: Pt will verbally sequence steps to functional tasks with 90% accuracy.   PMSV Trial  PMSV was placed for: 25 minutes Able to redirect subglottic air through upper airway: Yes Able to Attain Phonation: Yes Voice Quality: Aphonic;Hoarse;Low vocal intensity Able to Expectorate Secretions: No Breath Support for Phonation: Mildly decreased Intelligibility: Intelligible Word: 75-100% accurate Phrase: 75-100% accurate Sentence: 75-100% accurate SpO2 During Trial: 99 % Pulse During Trial: 76 Behavior: Cooperative;Responsive to questions          Vent Dependency  FiO2 (%): 28 %      GO         [name] 06/09/2013, 10:09 AM

## 2013-06-09 NOTE — Progress Notes (Signed)
[facility] PROGRESS NOTE  Brenda Pope ZOX:[id number] DOB: [birth year] DOA: 05/02/2013 PCP: Bobbye Riggs, NP   Please review hospitalists progress note from 8/13 for detailed hospital course but in brief 57 y/o female with hx of CVA, diastolic CHF, HTN admitted with acute CVA with prolonged hospital course including seizures, encephalopathy,a cute resp failure requiring ventilation followed by trach and dysphagia requiring PEG tube.   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 She received pulse dose steroid initially. W/up including CSF analysis and imaging indicating atherosclerosis rather than vasculitis. no further studies to offer at this time  -neurology signed off on 06/02/2013  -Continue aspirin  -has residual left sided hemiplegia and moderate  right sided weakness as well. PT following and patient slowly participating  Status epilepticus  Resolved.cont triple AEDs for now   Persistent coma/acute encephalopathy  Secondary to above. Now resolved   Acute respiratory failure due to AMS - s/p tracheostomy per PCCM  -Changed to 6 cuffless per PCCM  - progress PMV when stronger. will need trach clinic f/u in 6 wks after discharge  -clinically stable on trach collar   HTN  -continue Aldactone, Avapro, and metoprolol tartrate   Acute on chronic renal failure  Resolved   Hypernatremia  Resolved   Mild anemia,  Currently stable   Purulent tracheal secretions  No fever or resp distress  Completed 7 days levoflox on 8/11  -aggressive pulmonary toilet   DM2  -stable  -monitor CBG  -continue Lantus 40units   Dysphagia  -Status post gastrostomy tube 05/27/2013  -Tolerating Glucerna at 70 cc per hour   Code Status: DNR  Family Communication: spoke w/ husband at bedside   Disposition Plan:  Refused by [employer] given insurance issues. CIR evaluated patient and considered  not a candidate for  inpt rehab given her poor participation. No SNF available that can take pt with trach. patient may eventually need to go home with home health, but is not clinically ready yet. Husband and family need a lot of teaching on trach care, feeding tube and positioning of patient. She will need active rehab in order to improve and we will aggressively coordinate with the floor nurse, PT, resp therapy and nutrition to provide adequate teaching to the family in order to provide her a safe discharge. Patient should be clinically ready possibly early next week if she will be discharged home.  Consultants:  PCCM >> [facility]  Neurology - signed off   Procedures:  L radial A-line 7/10 >> 7/12  R IJ CVL 7/6 >> 7/21  ETT 7/6 >> 7/30  Trach (JY) 7/30 >> 8/6 cuffless #6 >>  G tube (IR) 8/1 >>  RUE PICC 8/1 >>   Antibiotics:  Levoflox 8/05 >> 8/11   DVT prophylaxis:  lovenox   HPI/Subjective: No overnight issues  Objective: Filed Vitals:   06/09/13 1520  BP: 146/59  Pulse: 65  Temp: 98.1 F (36.7 C)  Resp: 20    Intake/Output Summary (Last 24 hours) at 06/09/13 1649 Last data filed at 06/09/13 1400  Gross per 24 hour  Intake   1320 ml  Output      0 ml  Net   1320 ml   Filed Weights   05/29/13 0400 05/30/13 0500 05/31/13 0100  Weight: 122.6 kg (270 lb 4.5 oz) 123 kg (271 lb 2.7 oz) 124.2 kg (273 lb 13 oz)    Exam: General: Middle  aged obese female lying in bed in no acute distress  HEENT: No pallor, trach collar in place, moist mucosa  Chest: Diminished breath sounds bilaterally,   CVS: Normal S1 and S2, no murmurs rub or gallop  Abdomen: Soft, nontender, nondistended, bowel sounds present, G-tube in place Extremities: Trace edema, able to move her right extremity  CNS: Alert and awake, left hemiplegia   Data Reviewed: Basic Metabolic Panel:  Recent Labs Lab 06/04/13 0400 06/05/13 0415 06/07/13 0615  NA 137 135 137  K 4.2 4.3 4.1  CL 99 97 100  CO2 34*  34* 33*  GLUCOSE 170* 154* 159*  BUN 20 20 19   CREATININE 0.45* 0.44* 0.42*  CALCIUM 8.3* 8.4 8.5   Liver Function Tests:  Recent Labs Lab 06/06/13 0600  ALBUMIN 1.5*   No results found for this basename: LIPASE, AMYLASE,  in the last 168 hours No results found for this basename: AMMONIA,  in the last 168 hours CBC:  Recent Labs Lab 06/05/13 0415 06/07/13 0615  WBC 7.5 8.7  HGB 7.4* 7.8*  HCT 22.2* 23.0*  MCV 90.2 89.8  PLT 263 232   Cardiac Enzymes: No results found for this basename: CKTOTAL, CKMB, CKMBINDEX, TROPONINI,  in the last 168 hours BNP (last 3 results) No results found for this basename: PROBNP,  in the last 8760 hours CBG:  Recent Labs Lab 06/08/13 2335 06/09/13 0338 06/09/13 0745 06/09/13 1144 06/09/13 1634  GLUCAP 151* 154* 130* 157* 149*    Recent Results (from the past 240 hour(s))  CULTURE, RESPIRATORY (NON-EXPECTORATED)     Status: None   Collection Time    05/31/13 12:56 PM      Result Value Range Status   Specimen Description TRACHEAL ASPIRATE   Final   Special Requests NONE   Final   Gram Stain     Final   Value: FEW WBC PRESENT,BOTH PMN AND MONONUCLEAR     NO SQUAMOUS EPITHELIAL CELLS SEEN     FEW GRAM POSITIVE COCCI     IN PAIRS FEW GRAM NEGATIVE RODS     RARE GRAM POSITIVE RODS   Culture     Final   Value: Non-Pathogenic Oropharyngeal-type Flora Isolated.     Performed at [facility]   Report Status 06/02/2013 FINAL   Final  MRSA PCR SCREENING     Status: None   Collection Time    06/02/13  5:10 PM      Result Value Range Status   MRSA by PCR NEGATIVE  NEGATIVE Final   Comment:            The GeneXpert MRSA Assay (FDA     approved for NASAL specimens     only), is one component of a     comprehensive MRSA colonization     surveillance program. It is not     intended to diagnose MRSA     infection nor to guide or     monitor treatment for     MRSA infections.     Studies: No results found.  Scheduled  Meds: . antiseptic oral rinse  1 application Mouth Rinse QID  . aspirin  325 mg Per Tube Daily  . chlorhexidine  15 mL Mouth/Throat BID  . enoxaparin (LOVENOX) injection  40 mg Subcutaneous Q24H  . feeding supplement  30 mL Per Tube Daily  . free water  100 mL Per Tube Q8H  . insulin aspart  0-20 Units Subcutaneous Q4H  . insulin glargine  40 Units Subcutaneous QHS  . irbesartan  300 mg Per Tube Daily  . lacosamide  200 mg Per Tube BID  . levETIRAcetam  1,000 mg Per Tube BID  . metoprolol tartrate  25 mg Per Tube BID  . phenytoin  150 mg Per Tube TID  . sodium chloride  10-40 mL Intracatheter Q12H  . spironolactone  25 mg Per Tube Daily   Continuous Infusions: . feeding supplement (GLUCERNA 1.2 CAL) 1,000 mL (06/09/13 1352)      Time spent: 25 minutes    Perrin Gens  [facility] Pager [phone] If 7PM-7AM, please contact night-coverage at [email or web address], password [facility] 06/09/2013, 4:49 PM  LOS: 38 days

## 2013-06-09 NOTE — Progress Notes (Signed)
Physical Therapy Treatment Patient Details Name: Brenda Pope MRN: [id number] DOB: 1956/10/14 Today's Date: 06/09/2013 Time: 7829-5621 PT Time Calculation (min): 46 min  PT Assessment / Plan / Recommendation  History of Present Illness Pt admitted with CVA.  Intubated 7/6 for airway protection following suspected seizure activity. Hypotensive post intubation with vasopressors.  MRI revealed small acute infarcts R parietal lobe. Pt has been comatose most of this hospitalization.7/20 - MRI brain: Overall mildly regressed signal abnormality in the brain associated with multi focal infarcts. The most confluent infarcts in the right posterior MCA and PCA territories do demonstrate persistent restricted diffusion, but less cytotoxic edema. There is trace petechial hemorrhage, but no malignant hemorrhagic transformation or associated mass effect. 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 The most confluent infarcts in the right posterior MCA and PCA territories do demonstrate persistent restricted diffusion, but less cytotoxic edema. There is trace petechial hemorrhage, but no malignant hemorrhagic transformation or associated mass effect. 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 The most confluent infarcts in the right posterior MCA and PCA territories do demonstrate persistent restricted diffusion, but less cytotoxic edema. There is trace petechial hemorrhage, but no malignant hemorrhagic transformation or associated mass effect. 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 The most confluent infarcts in the right posterior MCA and PCA territories do demonstrate persistent restricted diffusion, but less cytotoxic edema. There is trace petechial hemorrhage, but no malignant hemorrhagic transformation or associated mass effect. 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 The most confluent infarcts in the right posterior MCA and PCA territories do demonstrate persistent restricted diffusion, but less cytotoxic edema. Ther   PT Comments   Small improvements in truncal activation sitting EOB;  Left pt on lift pad for nursing to assist her to the recliner.   Follow Up Recommendations  Supervision/Assistance - 24 hour;SNF;LTACH     Does the patient have the potential to tolerate intense rehabilitation     Barriers to Discharge        Equipment Recommendations  Other (comment)    Recommendations for Other Services    Frequency Min 3X/week   Progress towards PT Goals Progress towards PT goals: Progressing toward goals  Plan Current plan remains appropriate    Precautions / Restrictions Precautions Precautions: Fall Precaution Comments: trach with 02 Restrictions Weight Bearing Restrictions: No   Pertinent Vitals/Pain     Mobility  Bed Mobility Bed Mobility: Rolling Right;Rolling Left;Right Sidelying to Sit;Sit to Supine;Scooting to [facility];Supine to Sit;Sitting - Scoot to [place] of Bed Rolling Right: 1: +2 Total assist Rolling Right: Patient Percentage: 0% Rolling Left: 1: +2 Total assist Rolling Left: Patient Percentage: 0% Supine to Sit: 1: +2 Total assist;HOB flat Supine to Sit: Patient Percentage: 0% Sitting - Scoot to  Edge of Bed: 1: +2 Total assist Sitting - Scoot to Edge of Bed: Patient Percentage:  0% Sit to Supine: 1: +2 Total assist Sit to Supine: Patient Percentage: 0% Scooting to HOB: 1: +2 Total assist Scooting to [facility]: Patient Percentage: 0% Details for Bed Mobility Assistance: Pt unable to assist with bed mobility     Exercises General Exercises - Upper Extremity Shoulder Flexion: PROM;Both;5 reps;Supine Shoulder ABduction: PROM;Both;5 reps;Supine Elbow Flexion: PROM;Both;5 reps;Supine Wrist Flexion: PROM;Both;5 reps;Supine Wrist Extension: PROM;Both;5 reps;Supine General Exercises - Lower Extremity Heel Slides: PROM Shoulder Exercises Elbow Extension: PROM;Both;5 reps;Supine Hand Exercises Digit Composite Flexion: PROM;Both;5 reps;Supine Composite Extension: PROM;Both;5 reps;Supine Thumb Abduction: PROM;Both;5 reps;Supine Thumb Adduction: PROM;Both;5 reps;Supine Other Exercises Other Exercises: PROM to bil LE--no assist by pt today   PT Diagnosis:    PT Problem List:   PT Treatment Interventions:     PT Goals (current goals can now be found in the care plan section) Acute Rehab PT Goals Patient Stated Goal: husband is seeking post acute rehab and eventual return home PT Goal Formulation: With family Time For Goal Achievement: 06/15/13 Potential to Achieve Goals: Fair  Visit Information  Last PT Received On: 06/09/13 Assistance Needed: +2 History of Present Illness: Pt admitted with CVA.  Intubated 7/6 for airway protection following suspected seizure activity. Hypotensive post intubation with vasopressors.  MRI revealed small acute infarcts R parietal lobe. Pt has been comatose most of this hospitalization.7/20 - MRI brain: Overall mildly regressed signal abnormality in the brain associated with multi focal infarcts. The most confluent infarcts in the right posterior MCA and PCA territories do demonstrate persistent restricted diffusion, but less cytotoxic edema. There is trace  petechial hemorrhage, but no malignant hemorrhagic transformation or associated mass effect. 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 The most confluent infarcts in the right posterior MCA and PCA territories do demonstrate persistent restricted diffusion, but less cytotoxic edema. There is trace petechial hemorrhage, but no malignant hemorrhagic transformation or associated mass effect. 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 The most confluent infarcts in the right posterior MCA and PCA territories do demonstrate persistent restricted diffusion, but less cytotoxic edema. There is trace petechial hemorrhage, but no malignant hemorrhagic transformation or associated mass effect. 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 The most confluent infarcts in the right posterior MCA and PCA territories do demonstrate persistent restricted diffusion, but less cytotoxic edema. There is trace petechial hemorrhage, but no malignant hemorrhagic transformation or associated mass effect. 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 The most confluent infarcts in the right posterior MCA and PCA territories do demonstrate persistent restricted diffusion,  but less cytotoxic edema. Ther    Subjective Data  Patient Stated Goal: husband is seeking post acute rehab and eventual return home   Cognition  Cognition Arousal/Alertness: Awake/alert Behavior During Therapy: Flat affect Overall Cognitive Status: Impaired/Different from baseline Area of Impairment: Attention;Following commands;Awareness;Problem solving Current Attention Level: Selective Following Commands: Follows  one step commands consistently;Follows multi-step commands inconsistently Problem Solving: Slow processing;Decreased initiation;Requires verbal cues;Requires tactile cues General Comments: Pt with Lt. inattention/neglect    Balance  Balance Balance Assessed: Yes Static Sitting Balance Static Sitting - Balance Support: Left upper extremity supported;Feet supported Static Sitting - Level of Assistance: 1: +2 Total assist Static Sitting - Comment/# of Minutes: Pt ~20%. Sat ~10 Mins. Worked on moving into anterior pelvic tilt with max facilitation. Pt able to assist minimally. Worked on trunk rotation to Lt. with max facilitation. Pt demonstrates ability to initiate head/neck extension and neck rotation Lt. and Rt.   End of Session PT - End of Session Equipment Utilized During Treatment: Oxygen Activity Tolerance: Patient limited by fatigue Patient left: in bed;with call bell/phone within reach;with family/visitor present Nurse Communication: Mobility status   GP     Navia Lindahl, Eliseo Gum 06/09/2013, 5:04 PM 06/09/2013  [place], PT [phone]  (pager)

## 2013-06-09 NOTE — Progress Notes (Signed)
Occupational Therapy Treatment Patient Details Name: Brenda Pope MRN: [id number] DOB: [birth year] Today's Date: 06/09/2013 Time: 4098-1191 OT Time Calculation (min): 46 min  OT Assessment / Plan / Recommendation  History of present illness Pt admitted with CVA.  Intubated 7/6 for airway protection following suspected seizure activity. Hypotensive post intubation with vasopressors.  MRI revealed small acute infarcts R parietal lobe. Pt has been comatose most of this hospitalization.7/20 - MRI brain: Overall mildly regressed signal abnormality in the brain associated with multi focal infarcts. The most confluent infarcts in the right posterior MCA and PCA territories do demonstrate persistent restricted diffusion, but less cytotoxic edema. There is trace petechial hemorrhage, but no malignant hemorrhagic transformation or associated mass effect. 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 The most confluent infarcts in the right posterior MCA and PCA territories do demonstrate persistent restricted diffusion, but less cytotoxic edema. There is trace petechial hemorrhage, but no malignant hemorrhagic transformation or associated mass effect. 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 The most confluent infarcts in the right posterior MCA and PCA territories do demonstrate persistent restricted diffusion, but less cytotoxic edema. There is trace petechial hemorrhage, but no malignant hemorrhagic transformation or associated mass effect. 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 The most confluent infarcts in the right posterior MCA and PCA territories do demonstrate persistent restricted diffusion, but less cytotoxic edema. There is trace petechial hemorrhage, but no malignant hemorrhagic transformation or associated mass effect. 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 The most confluent infarcts in the right posterior MCA and PCA territories do demonstrate persistent restricted diffusion, but less cytotoxic edema. Ther   OT comments  Pt making slow steady progress with improving bil. UE movement, trunk control, and ability to participate.  She does fatigue quickly.  She demonstrates Lt. Inattention/neglect and requires mod cues to attend to Lt.  Husband not present today during session   Follow Up Recommendations  SNF    Barriers to Discharge       Equipment Recommendations  None recommended by OT    Recommendations for Other Services    Frequency Min 2X/week   Progress towards OT Goals Progress towards OT goals: Progressing toward goals  Plan Discharge plan remains appropriate    Precautions / Restrictions Precautions Precautions: Fall Precaution Comments: trach with 02 Restrictions Weight Bearing Restrictions: No   Pertinent Vitals/Pain     ADL  ADL Comments: Pt moved to EOB sitting.  Sat EOB x ~9 mins. see below for details.  Pt attempting to actively push through Rt. UE on command, and attempted to spontaneously reposition Lt. UE for comfort    OT Diagnosis:    OT Problem List:   OT Treatment Interventions:     OT Goals(current goals can now be found in the care plan section) Acute Rehab OT Goals Patient  Stated Goal:  husband is seeking post acute rehab and eventual return home OT Goal Formulation: With patient/family Time For Goal Achievement: 06/16/13 Potential to Achieve Goals: Good ADL Goals Additional ADL Goal #1: Pt will sit EOB x 10 minutes with moderate assistance and head at midline. Additional ADL Goal #2: Pt will respond to questions and simple commands within 5 seconds of request.  Visit Information  Last OT Received On: 06/09/13 Assistance Needed: +2 PT/OT Co-Evaluation/Treatment: Yes History of Present Illness: Pt admitted with CVA.  Intubated 7/6 for airway protection following suspected seizure activity. Hypotensive post intubation with vasopressors.  MRI revealed small acute infarcts R parietal lobe. Pt has been comatose most of this hospitalization.7/20 - MRI brain: Overall mildly regressed signal abnormality in the brain associated with multi focal infarcts. The most confluent infarcts in the right posterior MCA and PCA territories do demonstrate persistent restricted diffusion, but less cytotoxic edema. There is trace petechial hemorrhage, but no malignant hemorrhagic transformation or associated mass effect. 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 The most confluent infarcts in the right posterior MCA and PCA territories do demonstrate persistent restricted diffusion, but less cytotoxic edema. There is trace petechial hemorrhage, but no malignant hemorrhagic transformation or associated mass effect. 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 The most confluent infarcts in the right posterior MCA  and PCA territories do demonstrate persistent restricted diffusion, but less cytotoxic edema. There is trace petechial hemorrhage, but no malignant hemorrhagic transformation or associated mass effect. 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 The most confluent infarcts in the right posterior MCA and PCA territories do demonstrate persistent restricted diffusion, but less cytotoxic edema. There is trace petechial hemorrhage, but no malignant hemorrhagic transformation or associated mass effect. 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 The most confluent infarcts in the right posterior MCA and PCA territories do demonstrate persistent restricted diffusion, but less cytotoxic edema. Ther    Subjective Data      Prior Functioning       Cognition  Cognition Arousal/Alertness: Awake/alert Behavior During Therapy: Flat affect Overall Cognitive Status: Impaired/Different from baseline Area of Impairment: Attention;Following commands;Awareness;Problem solving Current Attention Level: Selective Following Commands: Follows one step commands consistently;Follows multi-step commands inconsistently Problem Solving: Slow processing;Decreased initiation;Requires verbal cues;Requires tactile cues General Comments: Pt with Lt. inattention/neglect    Mobility  Bed Mobility Bed Mobility: Rolling Right;Rolling Left;Right Sidelying to Sit;Sit to Supine;Scooting to [facility];Supine to Sit;Sitting - Scoot to [place] of Bed Rolling Right: 1: +2 Total assist Rolling Right: Patient Percentage: 0% Rolling Left: 1: +2 Total  assist Rolling Left: Patient Percentage: 0% Supine to Sit: 1: +2 Total assist;HOB flat Supine to Sit: Patient Percentage: 0% Sitting - Scoot to Edge of Bed: 1: +2 Total assist Sitting - Scoot to Edge of Bed: Patient Percentage: 0% Sit to Supine: 1: +2 Total assist Sit to Supine:  Patient Percentage: 0% Scooting to HOB: 1: +2 Total assist Scooting to [facility]: Patient Percentage: 0% Details for Bed Mobility Assistance: Pt unable to assist with bed mobility  Transfers Transfers: Not assessed    Exercises  General Exercises - Upper Extremity Shoulder Flexion: PROM;Both;5 reps;Supine Shoulder ABduction: PROM;Both;5 reps;Supine Elbow Flexion: PROM;Both;5 reps;Supine Wrist Flexion: PROM;Both;5 reps;Supine Wrist Extension: PROM;Both;5 reps;Supine Shoulder Exercises Elbow Extension: PROM;Both;5 reps;Supine Hand Exercises Digit Composite Flexion: PROM;Both;5 reps;Supine Composite Extension: PROM;Both;5 reps;Supine Thumb Abduction: PROM;Both;5 reps;Supine Thumb Adduction: PROM;Both;5 reps;Supine   Balance Balance Balance Assessed: Yes Static Sitting Balance Static Sitting - Balance Support: Left upper extremity supported;Feet supported Static Sitting - Level of Assistance: 1: +2 Total assist Static Sitting - Comment/# of Minutes: Pt ~20%.  Sat ~10 Mins.  Worked on moving into anterior pelvic tilt with max facilitation.  Pt able to assist minimally.  Worked on trunk rotation to Lt. with max facilitation.   Pt demonstrates ability to initiate head/neck extension and neck rotation Lt. and Rt.    End of Session OT - End of Session Activity Tolerance: Patient limited by fatigue Patient left: in bed;with call bell/phone within reach Nurse Communication: Mobility status;Other (comment) (lift pad left under pt for transfer to chair later)  GO     Viney Acocella, Ursula Alert M 06/09/2013, 2:46 PM

## 2013-06-09 NOTE — Progress Notes (Signed)
Dr. Gonzella Lex ok for an order for UA at this time. Will continue to monitor,   Sim Boast, RN 06/09/13 [phone]

## 2013-06-10 LAB — URINALYSIS, ROUTINE W REFLEX MICROSCOPIC
Glucose, UA: NEGATIVE mg/dL
Protein, ur: 100 mg/dL — AB
Specific Gravity, Urine: 1.014 (ref 1.005–1.030)
pH: 7.5 (ref 5.0–8.0)

## 2013-06-10 LAB — GLUCOSE, CAPILLARY
Glucose-Capillary: 157 mg/dL — ABNORMAL HIGH (ref 70–99)
Glucose-Capillary: 158 mg/dL — ABNORMAL HIGH (ref 70–99)
Glucose-Capillary: 160 mg/dL — ABNORMAL HIGH (ref 70–99)

## 2013-06-10 LAB — URINE MICROSCOPIC-ADD ON

## 2013-06-10 NOTE — Progress Notes (Signed)
[facility] PROGRESS NOTE  Rina Adney ZOX:[id number] DOB: 09-19-1956 DOA: 05/02/2013 PCP: Bobbye Riggs, NP  Brief narrative Please review hospitalists progress note from 8/13 for detailed hospital course but in brief 57 y/o female with hx of CVA, diastolic CHF, HTN admitted with acute CVA with prolonged hospital course including seizures, encephalopathy,a cute resp failure requiring ventilation followed by trach and dysphagia requiring PEG tube.   Assessment/Plan:  Acute CVA in multiple territories, unclear etiology  Concern for CNS vasculitis but no supportive evidence found after workup . She received pulse dose steroid initially. W/up including CSF analysis and imaging indicating atherosclerosis rather than vasculitis. no further studies to offer at this time  -neurology signed off on 06/02/2013  -Continue aspirin  -has residual left sided hemiplegia and moderate right sided weakness as well. PT following and patient very slowly participating.   Status epilepticus  Resolved.cont triple AEDs for now   Persistent coma/acute encephalopathy  Secondary to above. Now resolved   Acute respiratory failure due to AMS - s/p tracheostomy per PCCM  -Changed to 6 cuffless per PCCM  - progress PMV when stronger. will need trach clinic f/u in 6 wks after discharge  -clinically stable on trach collar   HTN  -continue Aldactone, Avapro, and metoprolol tartrate   Acute on chronic renal failure  Resolved   Hypernatremia  Resolved   Mild anemia,  Currently stable   Purulent tracheal secretions  No fever or resp distress  Completed 7 days levoflox on 8/11  -aggressive pulmonary toilet   DM2  -stable  -monitor CBG  -continue Lantus 40units   Dysphagia  -Status post gastrostomy tube 05/27/2013  -Tolerating Glucerna at 70 cc per hour   Code Status: DNR   Family Communication:  husband at bedside   Disposition Plan:  Refused by [employer] given insurance issues. CIR evaluated patient  and considered not a candidate for inpt rehab given her poor participation.  No SNF available that can take pt with trach. patient may eventually need to go home with home health, but is not clinically ready yet. Husband and family need a lot of teaching on trach care, feeding tube and positioning of patient. She will need active rehab in order to improve and we will aggressively coordinate with the floor nurse, PT, resp therapy and nutrition to provide adequate teaching to the family in order to provide her a safe discharge. Patient should be clinically ready possibly early next week if she will be discharged home.   Consultants:  PCCM >> [facility]  Neurology - signed off   Procedures:  L radial A-line 7/10 >> 7/12  R IJ CVL 7/6 >> 7/21  ETT 7/6 >> 7/30  Trach (JY) 7/30 >> 8/6 cuffless #6 >>  G tube (IR) 8/1 >>  RUE PICC 8/1 >>   Antibiotics:  Levoflox 8/05 >> 8/11   DVT prophylaxis:  lovenox   HPI/Subjective:  No overnight issues   Objective: Filed Vitals:   06/10/13 1215  BP:   Pulse: 74  Temp:   Resp: 18    Intake/Output Summary (Last 24 hours) at 06/10/13 1527 Last data filed at 06/10/13 0145  Gross per 24 hour  Intake      0 ml  Output    450 ml  Net   -450 ml   Filed Weights   05/29/13 0400 05/30/13 0500 05/31/13 0100  Weight: 122.6 kg (270 lb 4.5 oz) 123 kg (271 lb 2.7 oz) 124.2 kg (273 lb 13 oz)  Exam: General: Middle aged obese female lying in bed in no acute distress  HEENT: No pallor, trach collar in place, moist mucosa  Chest: Diminished breath sounds bilaterally,  CVS: Normal S1 and S2, no murmurs rub or gallop  Abdomen: Soft, nontender, nondistended, bowel sounds present, G-tube in place Extremities: Trace edema, able to gently move her right extremity  CNS: Alert and awake, left hemiplegia    Data Reviewed: Basic Metabolic Panel:  Recent Labs Lab 06/04/13 0400 06/05/13 0415 06/07/13 0615  NA 137 135 137  K 4.2 4.3 4.1  CL 99 97 100  CO2  34* 34* 33*  GLUCOSE 170* 154* 159*  BUN 20 20 19   CREATININE 0.45* 0.44* 0.42*  CALCIUM 8.3* 8.4 8.5   Liver Function Tests:  Recent Labs Lab 06/06/13 0600  ALBUMIN 1.5*   No results found for this basename: LIPASE, AMYLASE,  in the last 168 hours No results found for this basename: AMMONIA,  in the last 168 hours CBC:  Recent Labs Lab 06/05/13 0415 06/07/13 0615  WBC 7.5 8.7  HGB 7.4* 7.8*  HCT 22.2* 23.0*  MCV 90.2 89.8  PLT 263 232   Cardiac Enzymes: No results found for this basename: CKTOTAL, CKMB, CKMBINDEX, TROPONINI,  in the last 168 hours BNP (last 3 results) No results found for this basename: PROBNP,  in the last 8760 hours CBG:  Recent Labs Lab 06/09/13 1955 06/10/13 0030 06/10/13 0420 06/10/13 0752 06/10/13 1150  GLUCAP 164* 157* 158* 141* 171*    Recent Results (from the past 240 hour(s))  MRSA PCR SCREENING     Status: None   Collection Time    06/02/13  5:10 PM      Result Value Range Status   MRSA by PCR NEGATIVE  NEGATIVE Final   Comment:            The GeneXpert MRSA Assay (FDA     approved for NASAL specimens     only), is one component of a     comprehensive MRSA colonization     surveillance program. It is not     intended to diagnose MRSA     infection nor to guide or     monitor treatment for     MRSA infections.     Studies: No results found.  Scheduled Meds: . antiseptic oral rinse  1 application Mouth Rinse QID  . aspirin  325 mg Per Tube Daily  . chlorhexidine  15 mL Mouth/Throat BID  . enoxaparin (LOVENOX) injection  40 mg Subcutaneous Q24H  . feeding supplement  30 mL Per Tube Daily  . free water  100 mL Per Tube Q8H  . insulin aspart  0-20 Units Subcutaneous Q4H  . insulin glargine  40 Units Subcutaneous QHS  . irbesartan  300 mg Per Tube Daily  . lacosamide  200 mg Per Tube BID  . levETIRAcetam  1,000 mg Per Tube BID  . metoprolol tartrate  25 mg Per Tube BID  . phenytoin  150 mg Per Tube TID  . sodium  chloride  10-40 mL Intracatheter Q12H  . spironolactone  25 mg Per Tube Daily   Continuous Infusions: . feeding supplement (GLUCERNA 1.2 CAL) 1,000 mL (06/10/13 0658)      Time spent: 25 minutes    Daniil Labarge  [facility] Pager [phone]. If 7PM-7AM, please contact night-coverage at [email or web address], password [facility] 06/10/2013, 3:27 PM  LOS: 39 days

## 2013-06-10 NOTE — Progress Notes (Signed)
Physical Therapy Treatment Patient Details Name: Brenda Pope MRN: [id number] DOB: November 09, 1955 Today's Date: 06/10/2013 Time: 2130-8657 PT Time Calculation (min): 26 min  PT Assessment / Plan / Recommendation  History of Present Illness Pt admitted with CVA.  Intubated 7/6 for airway protection following suspected seizure activity. Hypotensive post intubation with vasopressors.  MRI revealed small acute infarcts R parietal lobe. Pt has been comatose most of this hospitalization.7/20 - MRI brain: Overall mildly regressed signal abnormality in the brain associated with multi focal infarcts. The most confluent infarcts in the right posterior MCA and PCA territories do demonstrate persistent restricted diffusion, but less cytotoxic edema. There is trace petechial hemorrhage, but no malignant hemorrhagic transformation or associated mass effect. 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 The most confluent infarcts in the right posterior MCA and PCA territories do demonstrate persistent restricted diffusion, but less cytotoxic edema. There is trace petechial hemorrhage, but no malignant hemorrhagic transformation or associated mass effect. 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 The most confluent infarcts in the right posterior MCA and PCA territories do demonstrate persistent restricted diffusion, but less cytotoxic edema. There is trace petechial hemorrhage, but no malignant hemorrhagic transformation or associated mass effect. 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 The most confluent infarcts in the right posterior MCA and PCA territories do demonstrate persistent restricted diffusion, but less cytotoxic edema. There is trace petechial hemorrhage, but no malignant hemorrhagic transformation or associated mass effect. 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 The most confluent infarcts in the right posterior MCA and PCA territories do demonstrate persistent restricted diffusion, but less cytotoxic edema. Ther   PT Comments   Continues to need extra encouragement and to make slow improvement, mostly R side and trunk   Follow Up Recommendations  Supervision/Assistance - 24 hour;SNF;LTACH     Does the patient have the potential to tolerate intense rehabilitation     Barriers to Discharge        Equipment Recommendations  Other (comment)    Recommendations for Other Services    Frequency Min 3X/week   Progress towards PT Goals Progress towards PT goals: Progressing toward goals (very slowly)  Plan Current plan remains appropriate    Precautions / Restrictions Precautions Precautions: Fall Precaution Comments: trach with 02 Restrictions Weight Bearing Restrictions: No   Pertinent Vitals/Pain     Mobility  Bed Mobility Bed Mobility: Rolling Right;Rolling Left;Right Sidelying to Sit;Sit to Supine;Scooting to [facility];Supine to Sit;Sitting - Scoot to [place] of Bed Rolling Right: 1: +2 Total assist Rolling Right: Patient Percentage: 0% Rolling Left: 1: +2 Total assist Rolling Left: Patient Percentage: 0% Supine to Sit: 1: +2 Total assist;HOB flat Supine to Sit: Patient Percentage: 0% Sitting - Scoot to Edge of Bed:  1: +2 Total assist Sitting - Scoot to Edge of Bed: Patient Percentage: 0% Sit to Supine:  1: +2 Total assist Sit to Supine: Patient Percentage: 0% Scooting to HOB: 1: +2 Total assist Scooting to [facility]: Patient Percentage: 0% Details for Bed Mobility Assistance: Pt unable to assist with bed mobility  Transfers Transfers: Not assessed Ambulation/Gait Ambulation/Gait Assistance: Not tested (comment)    Exercises General Exercises - Lower Extremity Heel Slides: PROM Other Exercises Other Exercises: PROM to bil LE--no assist by pt today   PT Diagnosis:    PT Problem List:   PT Treatment Interventions:     PT Goals (current goals can now be found in the care plan section) Acute Rehab PT Goals Patient Stated Goal: husband is seeking post acute rehab and eventual return home PT Goal Formulation: With family Time For Goal Achievement: 06/15/13 Potential to Achieve Goals: Fair  Visit Information  Last PT Received On: 06/10/13 Assistance Needed: +2 History of Present Illness: Pt admitted with CVA.  Intubated 7/6 for airway protection following suspected seizure activity. Hypotensive post intubation with vasopressors.  MRI revealed small acute infarcts R parietal lobe. Pt has been comatose most of this hospitalization.7/20 - MRI brain: Overall mildly regressed signal abnormality in the brain associated with multi focal infarcts. The most confluent infarcts in the right posterior MCA and PCA territories do demonstrate persistent restricted diffusion, but less cytotoxic edema. There is trace petechial hemorrhage, but no malignant hemorrhagic transformation or associated mass effect. 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 The most confluent infarcts in the right posterior MCA and PCA territories do  demonstrate persistent restricted diffusion, but less cytotoxic edema. There is trace petechial hemorrhage, but no malignant hemorrhagic transformation or associated mass effect. 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 The most confluent infarcts in the right posterior MCA and PCA territories do demonstrate persistent restricted diffusion, but less cytotoxic edema. There is trace petechial hemorrhage, but no malignant hemorrhagic transformation or associated mass effect. 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 The most confluent infarcts in the right posterior MCA and PCA territories do demonstrate persistent restricted diffusion, but less cytotoxic edema. There is trace petechial hemorrhage, but no malignant hemorrhagic transformation or associated mass effect. 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 The most confluent infarcts in the right posterior MCA and PCA territories do demonstrate persistent restricted diffusion, but less cytotoxic edema. Ther    Subjective Data  Patient Stated Goal: husband is seeking post acute rehab and eventual return home   Cognition  Cognition Arousal/Alertness: Awake/alert Behavior During Therapy: Flat affect Overall Cognitive Status: Impaired/Different from baseline Area of Impairment: Attention;Following commands;Awareness;Problem solving Current Attention Level:  Selective Following Commands: Follows one step commands consistently;Follows multi-step commands inconsistently Problem Solving: Slow processing;Decreased initiation;Requires verbal cues;Requires tactile cues General Comments: Pt with Lt. inattention/neglect    Balance  Balance Balance Assessed: Yes Static Sitting Balance Static Sitting - Balance Support: Left upper extremity supported;Feet supported  Static Sitting - Level of Assistance: 1: +2 Total assist Static Sitting - Comment/# of Minutes: sat up about 8 min working on truncal activation, upright sitting tolerance rotation and propping on each elbow for WB.  Pt provided approx. 10% at times  End of Session PT - End of Session Equipment Utilized During Treatment: Oxygen Activity Tolerance: Patient limited by fatigue Patient left: in bed;with call bell/phone within reach;with family/visitor present Nurse Communication: Mobility status   GP     Keana Dueitt, Eliseo Gum 06/10/2013, 11:10 AM 06/10/2013  Elkins Bing, PT [phone]  (pager)

## 2013-06-10 NOTE — Progress Notes (Signed)
Passy-Muir Speaking Valve - Treatment Patient Details  Name: Brenda Pope MRN: [id number] Date of Birth: 05/21/1956  Today's Date: 06/10/2013 Time: 0905-0920 SLP Time Calculation (min): 15 min  Past Medical History:  Past Medical History  Diagnosis Date  . Stroke   . Diabetes mellitus without complication   . Hypertension   . CHF (congestive heart failure)    Past Surgical History: History reviewed. No pertinent past surgical history.  Assessment / Plan / Recommendation Clinical Impression  F/u for PMSV and cognition.  Pt fatigued today.  Wearing valve with continued excellent toleration with re: to O2 sats and stable VS; improved volume; persisting low pitch, hoarse quality.  Pt with involuntary phonation upon exhalation.  Required mod-max cueing to attend to left side today; selective attention with max assist.  Repeatedly stating how tired she is; husband attributes to length of time sitting in recliner yesterday.  Encouraged pt to wear valve for increasing durations of time during day.    Plan  Continue with current plan of care    Follow Up Recommendations   SNF vs home per spouse   Pertinent Vitals/Pain No pain    SLP Goals SLP Goal #1: Pt will selectively attend to target stimuli in quiet environment for ten minutes with 80% accuracy. SLP Goal #1 - Progress: Progressing toward goal SLP Goal #3: Pt will utilize upper airway for cough/phonation with max cues and stable VS with PMSV in place SLP Goal #3 - Progress: Met SLP Goal #4: Pt will use PMSV and increase intelligibility to 90% with appropriate respiratory pacing.  SLP Goal #4 - Progress: Progressing toward goal SLP Goal #5: Pt will verbally sequence steps to functional tasks with 90% accuracy. SLP Goal #5 - Progress: Progressing toward goal   PMSV Trial  PMSV was placed for: 15 min Able to redirect subglottic air through upper airway: Yes Able to Attain Phonation: Yes Voice Quality: Aphonic;Hoarse;Low vocal  intensity Able to Expectorate Secretions: No Breath Support for Phonation: Mildly decreased Intelligibility: Intelligible Word: 75-100% accurate Phrase: 75-100% accurate Sentence: 75-100% accurate SpO2 During Trial: 100 % Pulse During Trial: 80 Behavior: Cooperative;Responsive to questions                  [name] 06/10/2013, 9:27 AM

## 2013-06-10 NOTE — Plan of Care (Signed)
Problem: Phase II Progression Outcomes Goal: Able to communicate Outcome: Progressing With Passy-Muir valve.

## 2013-06-11 LAB — CBC
MCH: 30.4 pg (ref 26.0–34.0)
MCV: 89.6 fL (ref 78.0–100.0)
Platelets: 235 10*3/uL (ref 150–400)
RDW: 16.2 % — ABNORMAL HIGH (ref 11.5–15.5)
WBC: 9.6 10*3/uL (ref 4.0–10.5)

## 2013-06-11 LAB — GLUCOSE, CAPILLARY
Glucose-Capillary: 107 mg/dL — ABNORMAL HIGH (ref 70–99)
Glucose-Capillary: 113 mg/dL — ABNORMAL HIGH (ref 70–99)

## 2013-06-11 LAB — URINE CULTURE: Colony Count: >=100000

## 2013-06-11 LAB — BASIC METABOLIC PANEL
Calcium: 8.9 mg/dL (ref 8.4–10.5)
Creatinine, Ser: 0.37 mg/dL — ABNORMAL LOW (ref 0.50–1.10)
GFR calc Af Amer: >90 mL/min (ref >90)
Sodium: 137 mEq/L (ref 135–145)

## 2013-06-11 MED ORDER — METOPROLOL TARTRATE 25 MG/10 ML ORAL SUSPENSION
50.0000 mg | Freq: Two times a day (BID) | ORAL | Status: DC
  Administered 2013-06-11 – 2013-06-17 (×12): 50 mg
  Filled 2013-06-11 (×14): qty 20

## 2013-06-11 NOTE — Progress Notes (Signed)
[facility] PROGRESS NOTE  Brenda Pope ZOX:[id number] DOB: [birth year] DOA: 05/02/2013 PCP: Bobbye Riggs, NP  Brief narrative  Please review hospitalists progress note from 8/13 for detailed hospital course but in brief 57 y/o female with hx of CVA, diastolic CHF, HTN admitted with acute CVA with prolonged hospital course including seizures, encephalopathy,a cute resp failure requiring ventilation followed by trach and dysphagia requiring PEG tube.   Assessment/Plan:  Acute CVA in multiple territories, unclear etiology  Concern for CNS vasculitis but no supportive evidence found after workup . She received pulse dose steroid initially. W/up including CSF analysis and imaging indicating atherosclerosis rather than vasculitis. no further studies to offer at this time  -neurology signed off on 06/02/2013  -Continue aspirin  -has residual left sided hemiplegia and moderate right sided weakness as well. PT following and patient very slowly participating.   Status epilepticus  Resolved.cont triple AEDs for now   Persistent coma/acute encephalopathy  Secondary to above. Now resolved   Acute respiratory failure due to AMS - s/p tracheostomy per PCCM  -Changed to 6 cuffless per PCCM  - progress PMV when stronger. will need trach clinic f/u in 6 wks after discharge  -clinically stable on trach collar   HTN  -continue Aldactone, Avapro, and metoprolol tartrate . BP elevated will increase dose of metoprolol  Acute on chronic renal failure  Resolved   Hypernatremia  Resolved   Mild anemia,  Currently stable   Purulent tracheal secretions  No fever or resp distress  Completed 7 days levoflox on 8/11  -aggressive pulmonary toilet   DM2  -stable  -monitor CBG  -continue Lantus 40units   Dysphagia  -Status post gastrostomy tube 05/27/2013  -Tolerating Glucerna at 70 cc per hour   Code Status: DNR  Family Communication: husband at bedside   dispo  likely home with services  early next week   Objective: Filed Vitals:   06/11/13 1431  BP: 162/87  Pulse: 76  Temp: 97.9 F (36.6 C)  Resp: 18   No intake or output data in the 24 hours ending 06/11/13 1549 Filed Weights   05/29/13 0400 05/30/13 0500 05/31/13 0100  Weight: 122.6 kg (270 lb 4.5 oz) 123 kg (271 lb 2.7 oz) 124.2 kg (273 lb 13 oz)    Exam: General: Middle aged obese female lying in bed in no acute distress  HEENT: No pallor, trach collar in place, moist mucosa  Chest: Diminished breath sounds bilaterally,  CVS: Normal S1 and S2, no murmurs rub or gallop  Abdomen: Soft, nontender, nondistended, bowel sounds present, G-tube in place Extremities: Trace edema, able to gently move her right extremity  CNS: Alert and awake, left hemiplegia   Data Reviewed: Basic Metabolic Panel:  Recent Labs Lab 06/05/13 0415 06/07/13 0615 06/11/13 1015  NA 135 137 137  K 4.3 4.1 4.5  CL 97 100 101  CO2 34* 33* 31  GLUCOSE 154* 159* 134*  BUN 20 19 19   CREATININE 0.44* 0.42* 0.37*  CALCIUM 8.4 8.5 8.9   Liver Function Tests:  Recent Labs Lab 06/06/13 0600  ALBUMIN 1.5*   No results found for this basename: LIPASE, AMYLASE,  in the last 168 hours No results found for this basename: AMMONIA,  in the last 168 hours CBC:  Recent Labs Lab 06/05/13 0415 06/07/13 0615 06/11/13 1015  WBC 7.5 8.7 9.6  HGB 7.4* 7.8* 8.8*  HCT 22.2* 23.0* 25.9*  MCV 90.2 89.8 89.6  PLT 263 232 235   Cardiac Enzymes:  No results found for this basename: CKTOTAL, CKMB, CKMBINDEX, TROPONINI,  in the last 168 hours BNP (last 3 results) No results found for this basename: PROBNP,  in the last 8760 hours CBG:  Recent Labs Lab 06/10/13 1955 06/10/13 2346 06/11/13 0402 06/11/13 0742 06/11/13 1212  GLUCAP 155* 160* 107* 113* 153*    Recent Results (from the past 240 hour(s))  MRSA PCR SCREENING     Status: None   Collection Time    06/02/13  5:10 PM      Result Value Range Status   MRSA by PCR NEGATIVE   NEGATIVE Final   Comment:            The GeneXpert MRSA Assay (FDA     approved for NASAL specimens     only), is one component of a     comprehensive MRSA colonization     surveillance program. It is not     intended to diagnose MRSA     infection nor to guide or     monitor treatment for     MRSA infections.  URINE CULTURE     Status: None   Collection Time    06/10/13  1:04 AM      Result Value Range Status   Specimen Description URINE, CATHETERIZED   Final   Special Requests CX ADDED AT 0129 ON [id number]   Final   Culture  Setup Time     Final   Value: 06/10/2013 02:38     Performed at [facility]   Colony Count     Final   Value: >=100,000 COLONIES/ML     Performed at [facility]   Culture     Final   Value: Multiple bacterial morphotypes present, none predominant. Suggest appropriate recollection if clinically indicated.     Performed at [facility]   Report Status 06/11/2013 FINAL   Final     Studies: No results found.  Scheduled Meds: . antiseptic oral rinse  1 application Mouth Rinse QID  . aspirin  325 mg Per Tube Daily  . chlorhexidine  15 mL Mouth/Throat BID  . enoxaparin (LOVENOX) injection  40 mg Subcutaneous Q24H  . feeding supplement  30 mL Per Tube Daily  . free water  100 mL Per Tube Q8H  . insulin aspart  0-20 Units Subcutaneous Q4H  . insulin glargine  40 Units Subcutaneous QHS  . irbesartan  300 mg Per Tube Daily  . lacosamide  200 mg Per Tube BID  . levETIRAcetam  1,000 mg Per Tube BID  . metoprolol tartrate  25 mg Per Tube BID  . phenytoin  150 mg Per Tube TID  . sodium chloride  10-40 mL Intracatheter Q12H  . spironolactone  25 mg Per Tube Daily   Continuous Infusions: . feeding supplement (GLUCERNA 1.2 CAL) 1,000 mL (06/11/13 0251)       Time spent 25 minutes    Kelvis Berger  [facility] Pager [phone]. If 7PM-7AM, please contact night-coverage at [email or web address], password [facility] 06/11/2013, 3:49 PM   LOS: 40 days

## 2013-06-12 LAB — GLUCOSE, CAPILLARY
Glucose-Capillary: 142 mg/dL — ABNORMAL HIGH (ref 70–99)
Glucose-Capillary: 169 mg/dL — ABNORMAL HIGH (ref 70–99)
Glucose-Capillary: 218 mg/dL — ABNORMAL HIGH (ref 70–99)

## 2013-06-12 NOTE — Progress Notes (Signed)
[facility] PROGRESS NOTE  Brenda Pope ZOX:[id number] DOB: 1956-01-13 DOA: 05/02/2013 PCP: Brenda Riggs, NP  Brief narrative  Please review hospitalists progress note from 8/13 for detailed hospital course but in brief 57 y/o female with hx of CVA, diastolic CHF, HTN admitted with acute CVA with prolonged hospital course including seizures, encephalopathy,a cute resp failure requiring ventilation followed by trach and dysphagia requiring PEG tube.   Assessment/Plan:  Acute CVA in multiple territories, unclear etiology  Concern for CNS vasculitis but no supportive evidence found after workup . She received pulse dose steroid initially. W/up including CSF analysis and imaging indicating atherosclerosis rather than vasculitis. no further studies to offer at this time  -neurology signed off on 06/02/2013  -Continue aspirin  -has residual left sided hemiplegia and moderate right sided weakness as well. PT following and patient very slowly participating.   Status epilepticus  Resolved.cont triple AEDs for now   Persistent coma/acute encephalopathy  Secondary to above. Now resolved   Acute respiratory failure due to AMS - s/p tracheostomy per PCCM  -Changed to 6 cuffless per PCCM  - progress PMV when stronger. will need trach clinic f/u in 6 wks after discharge  -clinically stable on trach collar   HTN  -continue Aldactone, Avapro, and metoprolol tartrate . BP elevated will increase dose of metoprolol   Acute on chronic renal failure  Resolved   Hypernatremia  Resolved   Mild anemia,  Currently stable   Purulent tracheal secretions  No fever or resp distress  Completed 7 days levoflox on 8/11  -aggressive pulmonary toilet   DM2  -stable  -monitor CBG  -continue Lantus 40units   Dysphagia  -Status post gastrostomy tube 05/27/2013  -Tolerating Glucerna at 70 cc per hour   Code Status: DNR  Family Communication: husband at bedside   dispo   pending   HPI/Subjective: No overnight issues. Patient asking when she will be discharged  Objective: Filed Vitals:   06/12/13 1209  BP:   Pulse: 55  Temp:   Resp: 20   No intake or output data in the 24 hours ending 06/12/13 1524 Filed Weights   05/29/13 0400 05/30/13 0500 05/31/13 0100  Weight: 122.6 kg (270 lb 4.5 oz) 123 kg (271 lb 2.7 oz) 124.2 kg (273 lb 13 oz)    Exam: General: Middle aged obese female lying in bed in no acute distress  HEENT: No pallor, trach collar in place, moist mucosa  Chest: Diminished breath sounds bilaterally,  CVS: Normal S1 and S2, no murmurs rub or gallop  Abdomen: Soft, nontender, nondistended, bowel sounds present, G-tube in place Extremities: Trace edema, able to gently move her right extremity  CNS: Alert and awake, left hemiplegia   Data Reviewed: Basic Metabolic Panel:  Recent Labs Lab 06/07/13 0615 06/11/13 1015  NA 137 137  K 4.1 4.5  CL 100 101  CO2 33* 31  GLUCOSE 159* 134*  BUN 19 19  CREATININE 0.42* 0.37*  CALCIUM 8.5 8.9   Liver Function Tests:  Recent Labs Lab 06/06/13 0600  ALBUMIN 1.5*   No results found for this basename: LIPASE, AMYLASE,  in the last 168 hours No results found for this basename: AMMONIA,  in the last 168 hours CBC:  Recent Labs Lab 06/07/13 0615 06/11/13 1015  WBC 8.7 9.6  HGB 7.8* 8.8*  HCT 23.0* 25.9*  MCV 89.8 89.6  PLT 232 235   Cardiac Enzymes: No results found for this basename: CKTOTAL, CKMB, CKMBINDEX, TROPONINI,  in the  last 168 hours BNP (last 3 results) No results found for this basename: PROBNP,  in the last 8760 hours CBG:  Recent Labs Lab 06/11/13 2013 06/12/13 0021 06/12/13 0415 06/12/13 0748 06/12/13 1145  GLUCAP 154* 142* 117* 146* 218*    Recent Results (from the past 240 hour(s))  MRSA PCR SCREENING     Status: None   Collection Time    06/02/13  5:10 PM      Result Value Range Status   MRSA by PCR NEGATIVE  NEGATIVE Final   Comment:             The GeneXpert MRSA Assay (FDA     approved for NASAL specimens     only), is one component of a     comprehensive MRSA colonization     surveillance program. It is not     intended to diagnose MRSA     infection nor to guide or     monitor treatment for     MRSA infections.  URINE CULTURE     Status: None   Collection Time    06/10/13  1:04 AM      Result Value Range Status   Specimen Description URINE, CATHETERIZED   Final   Special Requests CX ADDED AT 0129 ON [id number]   Final   Culture  Setup Time     Final   Value: 06/10/2013 02:38     Performed at [facility]   Colony Count     Final   Value: >=100,000 COLONIES/ML     Performed at [facility]   Culture     Final   Value: Multiple bacterial morphotypes present, none predominant. Suggest appropriate recollection if clinically indicated.     Performed at [facility]   Report Status 06/11/2013 FINAL   Final     Studies: No results found.  Scheduled Meds: . antiseptic oral rinse  1 application Mouth Rinse QID  . aspirin  325 mg Per Tube Daily  . chlorhexidine  15 mL Mouth/Throat BID  . enoxaparin (LOVENOX) injection  40 mg Subcutaneous Q24H  . feeding supplement  30 mL Per Tube Daily  . free water  100 mL Per Tube Q8H  . insulin aspart  0-20 Units Subcutaneous Q4H  . insulin glargine  40 Units Subcutaneous QHS  . irbesartan  300 mg Per Tube Daily  . lacosamide  200 mg Per Tube BID  . levETIRAcetam  1,000 mg Per Tube BID  . metoprolol tartrate  50 mg Per Tube BID  . phenytoin  150 mg Per Tube TID  . sodium chloride  10-40 mL Intracatheter Q12H  . spironolactone  25 mg Per Tube Daily   Continuous Infusions: . feeding supplement (GLUCERNA 1.2 CAL) 1,000 mL (06/11/13 0251)      Time spent: 25 minutes    Brenda Pope  [facility] Pager [phone]. If 7PM-7AM, please contact night-coverage at [email or web address], password [facility] 06/12/2013, 3:24 PM  LOS: 41 days

## 2013-06-13 LAB — GLUCOSE, CAPILLARY
Glucose-Capillary: 137 mg/dL — ABNORMAL HIGH (ref 70–99)
Glucose-Capillary: 164 mg/dL — ABNORMAL HIGH (ref 70–99)
Glucose-Capillary: 181 mg/dL — ABNORMAL HIGH (ref 70–99)

## 2013-06-13 NOTE — Progress Notes (Signed)
[facility] PROGRESS NOTE  Brenda Pope ZOX:[id number] DOB: [birth year] DOA: 05/02/2013 PCP: Bobbye Riggs, NP  Brief narrative  Please review hospitalists progress note from 8/13 for detailed hospital course but in brief 57 y/o female with hx of CVA, diastolic CHF, HTN admitted with acute CVA with prolonged hospital course including seizures, encephalopathy,a cute resp failure requiring ventilation followed by trach and dysphagia requiring PEG tube.   Assessment/Plan:  Acute CVA in multiple territories, unclear etiology  Concern for CNS vasculitis but no supportive evidence found after workup . She received pulse dose steroid initially. W/up including CSF analysis and imaging indicating atherosclerosis rather than vasculitis. no further studies to offer at this time  -neurology signed off on 06/02/2013  -Continue aspirin  -has residual left sided hemiplegia and moderate right sided weakness as well. PT following and patient very slowly participating.   Status epilepticus  Resolved.cont triple AEDs for now   Persistent coma/acute encephalopathy  Secondary to above. Now resolved   Acute respiratory failure due to AMS - s/p tracheostomy per PCCM  -Changed to 6 cuffless per PCCM  - progress PMV when stronger. will need trach clinic f/u in 6 wks after discharge  -clinically stable on trach collar .  HTN  -continue Aldactone, Avapro, and metoprolol tartrate . BP stable after adjusting metoprolol dose.   Acute on chronic renal failure  Resolved   Hypernatremia  Resolved   Mild anemia, Currently stable   Purulent tracheal secretions  No fever or resp distress  Completed 7 days levoflox on 8/11  -aggressive pulmonary toilet   DM2  -stable  -monitor CBG  -continue Lantus 40units   Dysphagia  -Status post gastrostomy tube 05/27/2013  -Tolerating Glucerna at 70 cc per hour   Code Status: DNR  Family Communication: husband at bedside   dispo  Given her aggressive and  complicated requirements at home will still work on a possible placement to [facility] or SNF. Husband still needs training to provide adequate care at home.  HPI/Subjective:  No overnight issues.   Objective: Filed Vitals:   06/13/13 1341  BP: 146/59  Pulse: 68  Temp: 98.3 F (36.8 C)  Resp: 18   No intake or output data in the 24 hours ending 06/13/13 1521 Filed Weights   05/29/13 0400 05/30/13 0500 05/31/13 0100  Weight: 122.6 kg (270 lb 4.5 oz) 123 kg (271 lb 2.7 oz) 124.2 kg (273 lb 13 oz)    Exam: General: Middle aged obese female lying in bed in no acute distress  HEENT: No pallor, trach collar in place, moist mucosa  Chest: Diminished breath sounds bilaterally,  CVS: Normal S1 and S2, no murmurs rub or gallop  Abdomen: Soft, nontender, nondistended, bowel sounds present, G-tube in place Extremities: Trace edema, able to gently move her right extremity  CNS: Alert and awake, left hemiplegia    Data Reviewed: Basic Metabolic Panel:  Recent Labs Lab 06/07/13 0615 06/11/13 1015  NA 137 137  K 4.1 4.5  CL 100 101  CO2 33* 31  GLUCOSE 159* 134*  BUN 19 19  CREATININE 0.42* 0.37*  CALCIUM 8.5 8.9   Liver Function Tests: No results found for this basename: AST, ALT, ALKPHOS, BILITOT, PROT, ALBUMIN,  in the last 168 hours No results found for this basename: LIPASE, AMYLASE,  in the last 168 hours No results found for this basename: AMMONIA,  in the last 168 hours CBC:  Recent Labs Lab 06/07/13 0615 06/11/13 1015  WBC 8.7 9.6  HGB  7.8* 8.8*  HCT 23.0* 25.9*  MCV 89.8 89.6  PLT 232 235   Cardiac Enzymes: No results found for this basename: CKTOTAL, CKMB, CKMBINDEX, TROPONINI,  in the last 168 hours BNP (last 3 results) No results found for this basename: PROBNP,  in the last 8760 hours CBG:  Recent Labs Lab 06/12/13 2005 06/13/13 0012 06/13/13 0354 06/13/13 0804 06/13/13 1131  GLUCAP 128* 164* 145* 132* 169*    Recent Results (from the past 240  hour(s))  URINE CULTURE     Status: None   Collection Time    06/10/13  1:04 AM      Result Value Range Status   Specimen Description URINE, CATHETERIZED   Final   Special Requests CX ADDED AT 0129 ON [id number]   Final   Culture  Setup Time     Final   Value: 06/10/2013 02:38     Performed at [facility]   Colony Count     Final   Value: >=100,000 COLONIES/ML     Performed at [facility]   Culture     Final   Value: Multiple bacterial morphotypes present, none predominant. Suggest appropriate recollection if clinically indicated.     Performed at [facility]   Report Status 06/11/2013 FINAL   Final     Studies: No results found.  Scheduled Meds: . antiseptic oral rinse  1 application Mouth Rinse QID  . aspirin  325 mg Per Tube Daily  . chlorhexidine  15 mL Mouth/Throat BID  . enoxaparin (LOVENOX) injection  40 mg Subcutaneous Q24H  . feeding supplement  30 mL Per Tube Daily  . free water  100 mL Per Tube Q8H  . insulin aspart  0-20 Units Subcutaneous Q4H  . insulin glargine  40 Units Subcutaneous QHS  . irbesartan  300 mg Per Tube Daily  . lacosamide  200 mg Per Tube BID  . levETIRAcetam  1,000 mg Per Tube BID  . metoprolol tartrate  50 mg Per Tube BID  . phenytoin  150 mg Per Tube TID  . sodium chloride  10-40 mL Intracatheter Q12H  . spironolactone  25 mg Per Tube Daily   Continuous Infusions: . feeding supplement (GLUCERNA 1.2 CAL) 1,000 mL (06/12/13 2331)      Time spent: 25 minutes    Kathelyn Gombos  [facility] Pager [phone] If 7PM-7AM, please contact night-coverage at [email or web address], password [facility] 06/13/2013, 3:21 PM  LOS: 42 days

## 2013-06-13 NOTE — Progress Notes (Signed)
NUTRITION FOLLOW UP  Intervention:   1. Continue Glucerna 1.2 @ 70 ml/hr with 30 ml Prostat daily and 100 ml free water flush TID.  TF regimen is providing 2116 kcal, 115 grams protein, 192 grams CHO, and 1360 ml H2O. Total free water: 1660 ml.   2. If pt to go home with PEG, home health RD will be available to adjust TF to nocturnal or bolus regimen as needed.   3. Recommend weigh pt at least weekly to assess adequacy of TF regimen.   Nutrition Dx:   Inadequate oral intake related to inability to eat as evidenced by NPO status; ongoing.   Goal:   Pt to meet >/= 90% of their estimated nutrition needs. Met   Monitor:   TF tolerance/adequacy, labs, weight trend, vent status, overall goals of care.  Assessment:   Pt admitted with small acute infarcts and seizures. Pt s/p trach 7/30 and PEG 8/1.  Per husband pt with liable blood sugars PTA. Stated that it was a miracle when her blood sugar was < 200.   Pt has trach in place, no vent support. Continues to work with SLP for PMV use. Making progress but not yet ready to transition to oral intake.   Patient has PEG in place. Jevity 1.2 running at 70 ml/hr. No issues with TF noted.   Planned for d/c to SNF vs home, CIR not accepting pt.  Pt will need to continue with PEG feeding until able to tolerate PMV for oral intake. Current TF regimen is appropriate at this time. Home health RD can adjust TF rate and regimen if needed for bolus or nocturnal feeding if desired.   Height: Ht Readings from Last 1 Encounters:  05/02/13 6' (1.829 m)    Weight Status:  Trending up with positive fluid status. No new weight since 8/5.  Wt Readings from Last 1 Encounters:  05/31/13 273 lb 13 oz (124.2 kg)  with noted 2+edema  05/07/13  254 lb 3.1 oz (115.3 kg)  05/06/13  257 lb 0.9 oz (116.6 kg)  05/02/13  238 lb 1.6 oz (108 kg)   Re-estimated needs:  Kcal: 2050-2250 Protein: 100-120 grams Fluid: > 2 L/day  Skin: skin tear on L groin    Diet  Order: NPO  No intake or output data in the 24 hours ending 06/13/13 1139  Last BM: 8/18  Labs:   Recent Labs Lab 06/07/13 0615 06/11/13 1015  NA 137 137  K 4.1 4.5  CL 100 101  CO2 33* 31  BUN 19 19  CREATININE 0.42* 0.37*  CALCIUM 8.5 8.9  GLUCOSE 159* 134*    CBG (last 3)   Recent Labs  06/13/13 0012 06/13/13 0354 06/13/13 0804  GLUCAP 164* 145* 132*    Scheduled Meds: . antiseptic oral rinse  1 application Mouth Rinse QID  . aspirin  325 mg Per Tube Daily  . chlorhexidine  15 mL Mouth/Throat BID  . enoxaparin (LOVENOX) injection  40 mg Subcutaneous Q24H  . feeding supplement  30 mL Per Tube Daily  . free water  100 mL Per Tube Q8H  . insulin aspart  0-20 Units Subcutaneous Q4H  . insulin glargine  40 Units Subcutaneous QHS  . irbesartan  300 mg Per Tube Daily  . lacosamide  200 mg Per Tube BID  . levETIRAcetam  1,000 mg Per Tube BID  . metoprolol tartrate  50 mg Per Tube BID  . phenytoin  150 mg Per Tube TID  .  sodium chloride  10-40 mL Intracatheter Q12H  . spironolactone  25 mg Per Tube Daily    Continuous Infusions: . feeding supplement (GLUCERNA 1.2 CAL) 1,000 mL (06/12/13 2331)    Clarene Duke RD, LDN Pager [phone] After Hours pager [phone]

## 2013-06-13 NOTE — Progress Notes (Signed)
Occupational Therapy Treatment Patient Details Name: Brenda Pope MRN: [id number] DOB: [birth year] Today's Date: 06/13/2013 Time: 4696-2952 OT Time Calculation (min): 33 min  OT Assessment / Plan / Recommendation  History of present illness Pt admitted with CVA.  Intubated 7/6 for airway protection following suspected seizure activity. Hypotensive post intubation with vasopressors.  MRI revealed small acute infarcts R parietal lobe. Pt has been comatose most of this hospitalization.7/20 - MRI brain: Overall mildly regressed signal abnormality in the brain associated with multi focal infarcts. The most confluent infarcts in the right posterior MCA and PCA territories do demonstrate persistent restricted diffusion, but less cytotoxic edema. There is trace petechial hemorrhage, but no malignant hemorrhagic transformation or associated mass effect. 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 The most confluent infarcts in the right posterior MCA and PCA territories do demonstrate persistent restricted diffusion, but less cytotoxic edema. There is trace petechial hemorrhage, but no malignant hemorrhagic transformation or associated mass effect. 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 The most confluent infarcts in the right posterior MCA and PCA territories do demonstrate persistent restricted diffusion, but less cytotoxic edema. There is trace petechial hemorrhage, but no malignant hemorrhagic transformation or associated mass effect. 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 The most confluent infarcts in the right posterior MCA and PCA territories do demonstrate persistent restricted diffusion, but less cytotoxic edema. There is trace petechial hemorrhage, but no malignant hemorrhagic transformation or associated mass effect. 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 The most confluent infarcts in the right posterior MCA and PCA territories do demonstrate persistent restricted diffusion, but less cytotoxic edema. Ther   OT comments  Pt with slowly improving activity tolerance, and slowly improving Rt UE function.  Pt able to begin to assist with simple grooming activities, but is only able to assist with 5-10%.  She was able to increase time sitting EOB, but requires max encouragement.  Improved attention to Lt. today  Follow Up Recommendations  Home health OT;Supervision/Assistance - 24 hour    Barriers to Discharge       Equipment Recommendations  3 in 1 bedside comode;Hospital bed;Wheelchair (measurements OT);Wheelchair cushion (measurements OT)    Recommendations for Other Services    Frequency Min 2X/week   Progress towards OT Goals Progress towards OT goals: Progressing toward goals  Plan Discharge plan needs to be updated    Precautions / Restrictions Precautions Precautions: Fall Precaution Comments: trach with 02 Restrictions Weight Bearing Restrictions: No   Pertinent Vitals/Pain     ADL  Grooming: Brushing hair;+1 Total assistance Where Assessed - Grooming: Supported sitting    OT Diagnosis:    OT Problem List:   OT Treatment Interventions:     OT Goals(current goals can now be found in the care plan  section) ADL Goals Additional ADL Goal #1: Pt will sit EOB x  10 minutes with moderate assistance and head at midline. Additional ADL Goal #2: Pt will respond to questions and simple commands within 5 seconds of request.  Visit Information  Last OT Received On: 06/13/13 Assistance Needed: +2 PT/OT Co-Evaluation/Treatment: Yes History of Present Illness: Pt admitted with CVA.  Intubated 7/6 for airway protection following suspected seizure activity. Hypotensive post intubation with vasopressors.  MRI revealed small acute infarcts R parietal lobe. Pt has been comatose most of this hospitalization.7/20 - MRI brain: Overall mildly regressed signal abnormality in the brain associated with multi focal infarcts. The most confluent infarcts in the right posterior MCA and PCA territories do demonstrate persistent restricted diffusion, but less cytotoxic edema. There is trace petechial hemorrhage, but no malignant hemorrhagic transformation or associated mass effect. 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 The most confluent infarcts in the right posterior MCA and PCA territories do demonstrate persistent restricted diffusion, but less cytotoxic edema. There is trace petechial hemorrhage, but no malignant hemorrhagic transformation or associated mass effect. 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 The most confluent infarcts in the right posterior MCA and PCA territories do demonstrate persistent restricted diffusion, but less cytotoxic edema. There is trace petechial hemorrhage, but no malignant hemorrhagic transformation or  associated mass effect. 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 The most confluent infarcts in the right posterior MCA and PCA territories do demonstrate persistent restricted diffusion, but less cytotoxic edema. There is trace petechial hemorrhage, but no malignant hemorrhagic transformation or associated mass effect. 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 The most confluent infarcts in the right posterior MCA and PCA territories do demonstrate persistent restricted diffusion, but less cytotoxic edema. Ther    Subjective Data      Prior Functioning       Cognition  Cognition Arousal/Alertness: Awake/alert Behavior During Therapy: Flat affect Overall Cognitive Status: Impaired/Different from baseline Current Attention Level: Selective Following Commands: Follows one step commands consistently;Follows multi-step commands inconsistently Problem Solving: Slow processing;Decreased initiation;Requires verbal cues;Requires tactile cues General Comments:  (Pt looking to Lt with min verbal cues today.  )    Mobility  Bed Mobility Bed Mobility: Supine to Sit;Sitting - Scoot to [place] of Bed;Sit to Supine Supine to Sit: 1: +2 Total assist Supine to Sit: Patient Percentage: 10% Sitting - Scoot to Edge of Bed: 1: +2 Total assist Sitting - Scoot to Edge of Bed: Patient Percentage: 0% Sit to Supine: 1: +2 Total assist Sit to Supine: Patient Percentage: 10% Scooting to HOB: 1: +2 Total assist Scooting to [facility]: Patient Percentage: 0% Details for Bed Mobility Assistance: Pt able to assist minimally this date with  bed mobility Transfers Transfers: Not assessed    Exercises  General Exercises - Upper Extremity Shoulder Flexion: PROM;Both;5 reps;Supine Elbow Flexion: PROM;AROM;Right;Left;5 reps;Supine Wrist Flexion: PROM;AAROM;Right;Left;5 reps;Supine Wrist Extension: PROM;AAROM;Right;Left;5 reps;Supine Shoulder Exercises Elbow  Extension: PROM;AAROM;Left;Right;5 reps;Supine Hand Exercises Digit Composite Flexion: PROM;AAROM;Right;Left;5 reps;Supine Composite Extension: PROM;AAROM;Right;Left;5 reps;Supine   Balance Balance Balance Assessed: Yes Static Sitting Balance Static Sitting - Balance Support: Left upper extremity supported Static Sitting - Level of Assistance: 1: +1 Total assist Static Sitting - Comment/# of Minutes: Pt sat EOB x 19 mins.     End of Session OT - End of Session Activity Tolerance: Patient limited by fatigue Patient left: in bed;with call bell/phone within reach;with family/visitor present Nurse Communication: Mobility status  GO     [place] Pacella, Ursula Alert M 06/13/2013, 2:35 PM

## 2013-06-13 NOTE — Progress Notes (Signed)
Physical Therapy Treatment Patient Details Name: Brenda Pope MRN: [id number] DOB: 1956-04-14 Today's Date: 06/13/2013 Time: 7846-9629 PT Time Calculation (min): 35 min  PT Assessment / Plan / Recommendation  History of Present Illness Pt admitted with CVA.  Intubated 7/6 for airway protection following suspected seizure activity. Hypotensive post intubation with vasopressors.  MRI revealed small acute infarcts R parietal lobe. Pt has been comatose most of this hospitalization.7/20 - MRI brain: Overall mildly regressed signal abnormality in the brain associated with multi focal infarcts. The most confluent infarcts in the right posterior MCA and PCA territories do demonstrate persistent restricted diffusion, but less cytotoxic edema. There is trace petechial hemorrhage, but no malignant hemorrhagic transformation or associated mass effect. 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 The most confluent infarcts in the right posterior MCA and PCA territories do demonstrate persistent restricted diffusion, but less cytotoxic edema. There is trace petechial hemorrhage, but no malignant hemorrhagic transformation or associated mass effect. 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 The most confluent infarcts in the right posterior MCA and PCA territories do demonstrate persistent restricted diffusion, but less cytotoxic edema. There is trace petechial hemorrhage, but no malignant hemorrhagic transformation or associated mass effect. 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 The most confluent infarcts in the right posterior MCA and PCA territories do demonstrate persistent restricted diffusion, but less cytotoxic edema. There is trace petechial hemorrhage, but no malignant hemorrhagic transformation or associated mass effect. 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 The most confluent infarcts in the right posterior MCA and PCA territories do demonstrate persistent restricted diffusion, but less cytotoxic edema. Ther   PT Comments   Pt able to show about 10% resistance to fallingl backward or forward while sitting EOB.  Continues to improve minimally from week to week, but is very easy to fatigue.   Follow Up Recommendations  Supervision/Assistance - 24 hour;SNF;LTACH     Does the patient have the potential to tolerate intense rehabilitation     Barriers to Discharge        Equipment Recommendations       Recommendations for Other Services    Frequency Min 3X/week   Progress towards PT Goals Progress towards PT goals: Progressing toward goals  Plan Current plan remains appropriate    Precautions / Restrictions Precautions Precautions: Fall Precaution Comments: trach with 02 Restrictions Weight Bearing Restrictions: No   Pertinent Vitals/Pain     Mobility  Bed Mobility Bed Mobility: Supine to Sit;Sitting - Scoot to Edge of Bed;Sit to Supine Supine to Sit: 1: +2 Total assist Supine to Sit: Patient Percentage: 10% Sitting - Scoot to Edge of Bed: 1: +2 Total assist Sitting - Scoot to Edge of Bed: Patient Percentage: 0% Sit to Supine: 1: +2 Total assist Sit to Supine: Patient Percentage: 10% Scooting to HOB: 1:  +2 Total assist  Scooting to [facility]: Patient Percentage: 0% Details for Bed Mobility Assistance: Pt able to assist minimally this date with bed mobility Transfers Transfers: Not assessed Ambulation/Gait Ambulation/Gait Assistance: Not tested (comment)    Exercises General Exercises - Upper Extremity Shoulder Flexion: PROM;Both;5 reps;Supine Elbow Flexion: PROM;AROM;Right;Left;5 reps;Supine Wrist Flexion: PROM;AAROM;Right;Left;5 reps;Supine Wrist Extension: PROM;AAROM;Right;Left;5 reps;Supine General Exercises - Lower Extremity Heel Slides: PROM Shoulder Exercises Elbow Extension: PROM;AAROM;Left;Right;5 reps;Supine Hand Exercises Digit Composite Flexion: PROM;AAROM;Right;Left;5 reps;Supine Composite Extension: PROM;AAROM;Right;Left;5 reps;Supine Other Exercises Other Exercises: PROM Bil LE   PT Diagnosis:    PT Problem List:   PT Treatment Interventions:     PT Goals (current goals can now be found in the care plan section) Acute Rehab PT Goals PT Goal Formulation: With family Time For Goal Achievement: 06/15/13 Potential to Achieve Goals: Fair  Visit Information  Last PT Received On: 06/13/13 Assistance Needed: +2 History of Present Illness: Pt admitted with CVA.  Intubated 7/6 for airway protection following suspected seizure activity. Hypotensive post intubation with vasopressors.  MRI revealed small acute infarcts R parietal lobe. Pt has been comatose most of this hospitalization.7/20 - MRI brain: Overall mildly regressed signal abnormality in the brain associated with multi focal infarcts. The most confluent infarcts in the right posterior MCA and PCA territories do demonstrate persistent restricted diffusion, but less cytotoxic edema. There is trace petechial hemorrhage, but no malignant hemorrhagic transformation or associated mass effect. 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 The most confluent infarcts in the right posterior MCA and PCA territories do demonstrate persistent restricted diffusion, but less cytotoxic edema. There is trace petechial hemorrhage, but no malignant hemorrhagic transformation or associated mass effect. 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 The most confluent infarcts in the right posterior MCA and PCA territories do demonstrate persistent restricted diffusion, but less cytotoxic edema. There is trace petechial hemorrhage, but no malignant hemorrhagic transformation or associated mass effect. 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 The most confluent infarcts in the right posterior MCA and PCA territories do demonstrate persistent restricted diffusion, but less cytotoxic edema. There is trace petechial hemorrhage, but no malignant hemorrhagic transformation or associated mass effect. 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 The most confluent infarcts in the right posterior MCA and PCA territories do demonstrate persistent restricted diffusion, but less cytotoxic edema. Ther    Subjective Data      Cognition  Cognition Arousal/Alertness: Awake/alert Behavior During Therapy: Flat affect Overall Cognitive Status: Impaired/Different from baseline Current Attention  Level: Selective Following Commands: Follows one step commands consistently;Follows multi-step commands inconsistently Problem Solving: Slow processing;Decreased initiation;Requires verbal cues;Requires tactile cues General Comments:  (Pt looking to Lt with min verbal cues today.  )    Balance  Balance Balance Assessed: Yes Static Sitting Balance Static Sitting - Balance Support: Left upper extremity supported Static Sitting - Level of  Assistance: 1: +1 Total assist Static Sitting - Comment/# of Minutes:   Pt sat EOB x 19 mins.    End of Session PT - End of Session Equipment Utilized During Treatment: Oxygen Activity Tolerance: Patient limited by fatigue Patient left: in bed;with call bell/phone within reach;with family/visitor present Nurse Communication: Mobility status   GP     Herve Haug, Eliseo Gum 06/13/2013, 2:53 PM 06/13/2013  [place], PT [phone]  (pager)

## 2013-06-14 DIAGNOSIS — R1310 Dysphagia, unspecified: Secondary | ICD-10-CM | POA: Diagnosis not present

## 2013-06-14 DIAGNOSIS — E870 Hyperosmolality and hypernatremia: Secondary | ICD-10-CM | POA: Diagnosis not present

## 2013-06-14 DIAGNOSIS — G9340 Encephalopathy, unspecified: Secondary | ICD-10-CM

## 2013-06-14 DIAGNOSIS — I10 Essential (primary) hypertension: Secondary | ICD-10-CM | POA: Diagnosis present

## 2013-06-14 DIAGNOSIS — N179 Acute kidney failure, unspecified: Secondary | ICD-10-CM | POA: Diagnosis not present

## 2013-06-14 LAB — GLUCOSE, CAPILLARY
Glucose-Capillary: 107 mg/dL — ABNORMAL HIGH (ref 70–99)
Glucose-Capillary: 118 mg/dL — ABNORMAL HIGH (ref 70–99)
Glucose-Capillary: 140 mg/dL — ABNORMAL HIGH (ref 70–99)
Glucose-Capillary: 173 mg/dL — ABNORMAL HIGH (ref 70–99)
Glucose-Capillary: 73 mg/dL (ref 70–99)

## 2013-06-14 MED ORDER — BIOTENE DRY MOUTH MT LIQD: 1.0000 "application " | Freq: Four times a day (QID) | OROMUCOSAL | Status: DC

## 2013-06-14 MED ORDER — IRBESARTAN 300 MG PO TABS: 300.0000 mg | ORAL_TABLET | Freq: Every day | ORAL | Status: DC

## 2013-06-14 MED ORDER — CHLORHEXIDINE GLUCONATE 0.12 % MT SOLN: 15.0000 mL | Freq: Two times a day (BID) | OROMUCOSAL | Status: DC

## 2013-06-14 MED ORDER — BIOTENE DRY MOUTH MT LIQD
1.0000 "application " | Freq: Four times a day (QID) | OROMUCOSAL | Status: DC
  Administered 2013-06-15 – 2013-06-17 (×9): 15 mL via OROMUCOSAL

## 2013-06-14 MED ORDER — PRO-STAT SUGAR FREE PO LIQD: 30.0000 mL | Freq: Every day | ORAL | Status: DC

## 2013-06-14 MED ORDER — LACOSAMIDE 200 MG PO TABS: 200.0000 mg | ORAL_TABLET | Freq: Two times a day (BID) | ORAL | Status: DC

## 2013-06-14 MED ORDER — CHLORHEXIDINE GLUCONATE 0.12 % MT SOLN
15.0000 mL | Freq: Two times a day (BID) | OROMUCOSAL | Status: DC
  Administered 2013-06-14 – 2013-06-17 (×6): 15 mL via OROMUCOSAL
  Filled 2013-06-14 (×8): qty 15

## 2013-06-14 MED ORDER — SPIRONOLACTONE 25 MG PO TABS: 25.0000 mg | ORAL_TABLET | Freq: Every day | ORAL | Status: DC

## 2013-06-14 MED ORDER — SODIUM CHLORIDE 0.9 % IJ SOLN
10.0000 mL | INTRAMUSCULAR | Status: DC | PRN
  Administered 2013-06-14 – 2013-06-16 (×3): 20 mL

## 2013-06-14 MED ORDER — FREE WATER: 100.0000 mL | Freq: Three times a day (TID) | Status: DC

## 2013-06-14 MED ORDER — PHENYTOIN 125 MG/5ML PO SUSP: 150.0000 mg | Freq: Three times a day (TID) | ORAL | Status: DC

## 2013-06-14 MED ORDER — ASPIRIN 325 MG PO TABS
325.0000 mg | ORAL_TABLET | Freq: Every day | ORAL | Status: DC
  Administered 2013-06-15 – 2013-06-17 (×3): 325 mg
  Filled 2013-06-14 (×3): qty 1

## 2013-06-14 MED ORDER — ASPIRIN 325 MG PO TABS: 325.0000 mg | ORAL_TABLET | Freq: Every day | ORAL | Status: DC

## 2013-06-14 MED ORDER — INSULIN GLARGINE 100 UNIT/ML ~~LOC~~ SOLN: 40.0000 [IU] | Freq: Every day | SUBCUTANEOUS | Status: DC

## 2013-06-14 MED ORDER — ACETAMINOPHEN 160 MG/5ML PO SOLN: 500.0000 mg | ORAL | Status: DC | PRN

## 2013-06-14 MED ORDER — GLUCERNA 1.2 CAL PO LIQD: 1000.0000 mL | ORAL | Status: DC

## 2013-06-14 MED ORDER — METOPROLOL TARTRATE 25 MG/10 ML ORAL SUSPENSION: 50.0000 mg | Freq: Two times a day (BID) | ORAL | Status: DC

## 2013-06-14 MED ORDER — LEVETIRACETAM 100 MG/ML PO SOLN: 1000.0000 mg | Freq: Two times a day (BID) | ORAL | Status: DC

## 2013-06-14 NOTE — Discharge Summary (Addendum)
Physician Discharge Summary  Brenda Pope ZOX:[id number] DOB: [birth year] DOA: 05/02/2013  PCP: Bobbye Riggs, NP  Admit date: 05/02/2013 Discharge date: 06/16/2013  Time spent: 100  minutes  Recommendations for Outpatient Follow-up:  1. Family refused SNF placement 2. Home health 3. Match program information given 4. DNR  Discharge Diagnoses:  Principle problem   CVA (cerebral infarction)  Active Problems:   Acute respiratory failure   Seizures   Coma   DM2 (diabetes mellitus, type 2)   Hyperglycemia   Tracheostomy status   Acute tracheobronchitis   Unspecified essential hypertension   Hypernatremia   AKI (acute kidney injury)   Acute encephalopathy   Dysphagia, unspecified(787.20)   Discharge Condition: Fair   Diet recommendation: tube feeding  Filed Weights   05/29/13 0400 05/30/13 0500 05/31/13 0100  Weight: 122.6 kg (270 lb 4.5 oz) 123 kg (271 lb 2.7 oz) 124.2 kg (273 lb 13 oz)    History of present illness:  57 y/o with past medical history of CVAs/TIAs and diastolic heart failure, severe baseline hypertension with noncompliance, poorly controlled diabetes who was admitted to [facility] on 7/5 with acute dyspnea and suspected CHF exacerbation. On the day of admission she developed neurological findings consistent with acute CVA. On 7/6 she required intubated for airway protection during suspected seizure activity. She became hypotensive post intubation requiring vasopressors. MRI confirmed small acute infarcts of the right parietal lobe. On 7/7 she was transferred to [facility] for further management per the PCCM service. Transferred to [facility] 05/31/13, then transferred to Neuroscience floor 06/05/13.   SIGNIFICANT EVENTS / STUDIES:  7/05 - Admitted to [facility] with CHF exacerbation; developed stroke symptoms  7/06 - Suspected seizure activity, intubated for airway protection; hypotensive post intubation requiring vasopressors  7/06 - Brain MRI: Multiple small acute  infarcts R parietal lobe, possible small infarcts left basal ganglia and central pons  7/06 - TTE: No source of thrombus/CVA, EF 55-60%  7/06 - Carotid Doppler: No evidence of hemodynamically significant stenosis  7/07 - Transferred to [facility] - PCCM serivce  7/07 - Neuro consult: "Concern patient is in SE in the context of recent cortical stroke."  7/08 - MRI brain: Confluent and scattered right MCA and right PCA acute infarcts, plus scattered small left hemisphere and occasional posterior fossa acute infarcts. The vast majority of these infarcts are newly seen since 05/01/2013. This appearance suggests embolic phenomena which severely affected more proximal/medium-sized vessels of the right MCA and PCA territories. Cytotoxic edema without significant mass effect at this time. No associated hemorrhage  7/08 - EEG: severe diffuse encephalopathic process with asymmetrical involvement. Findings reported from the left hemisphere were nonspecific. Findings record from the right hemisphere were consistent with acute large area of infarction. No frank epileptiform activity was recorded. PLEDs not considered epileptic, most often seen with acute large hemispheric strokes  7/09 - EEG: consistent with recurrent seizures which amount to partial status epilepticus in the right hemisphere.  7/10 - LP performed by Neuro  7/10 - Burst suppression with propofol. Norepi initiated to prevent propofol induced hypotension. Consideration of primary CNS vasculitis. Serologies ordered>> CRP, ESR markedly elevated. High dose methylpred ordered. Insulin gtt ordered while on high dose steriods  7/10 - propofol ineffective at burst suppression. pentobarbitol initiated  7/11 - CT head: Right frontal lobe, right parietal lobe, right occipital lobe and posterior right temporal lobe acute/subacute infarcts. Surrounding edema with local mass effect. Minimal petechial hemorrhage suspected particularly along the superior margin of the  infarct. No  gross hemorrhage noted. No midline shift 7/11 - angiogram eval for vasculitis: smooth focal areas of narrowing of prox PCAs and distal pericallosal. Subcortical branches, suspicious of vasculitis.  7/14 - TEE: no evidence of thrombus  7/20 - MRI brain: Overall mildly regressed signal abnormality in the brain associated with multi focal infarcts. The most confluent infarcts in the right posterior MCA and PCA territories do demonstrate persistent restricted diffusion, but less cytotoxic edema. There is trace petechial hemorrhage, but no malignant hemorrhagic transformation or associated mass effect. Slight extension of a central pontine lacunar infarct (pontine perforator artery territory) without mass effect or hemorrhage  7/21 - EEG: severe encephalopathy, nonspecific as to cause. No electrography seizures noted.  7/22 - Family indicated that they did not wish to pursue trach/G-tube and SNF. However, they were not ready to "give up" yet citing their concern that all of the meds used to induced coma might not have fully washed out of her system yet. Noted that she had developed severe hypernatremia. Free water increased. Made DNR in event of cardiac arrest  7/28 - pt following commands, family is now considering Trach and rehab for patient. Husband to decide in the coming days.  7/30 - Trach completed by Dr. Molli Knock  7/31 - Family ok with placement of Gastric tube per IR  8/01 - G tube placed by IR  8/08 - More alert - Transferred to [facility]   8/11:trasnferred to neuro floor under hospitalist service     Hospital Course:  Acute CVA in multiple territories, unclear etiology  Concern for CNS vasculitis but no supportive evidence found after workup . She received pulse dose steroid initially. W/up including CSF analysis and imaging indicating atherosclerosis rather than vasculitis. no further studies to offer at this time  -neurology signed off on 06/02/2013  -Continue aspirin  -has residual  left sided hemiplegia and moderate right sided weakness as well. PT following and patient very slowly participating.   Status epilepticus  Resolved.cont triple AEDs for now . Phenytoin level therapeutic corrected for low albumin.  Persistent coma/acute encephalopathy  Secondary to above. Now resolved   Acute respiratory failure due to AMS - s/p tracheostomy per PCCM  -Changed to 6 cuffless per PCCM  - progress PMV when stronger. will need trach clinic f/u in 6 wks after discharge  -clinically stable on trach collar .  1.  6 cuffless tracheostomy (entire trach) to keep at bedside (in the event of dislodgement)  2. #6 cuffless inner cannulas for daily change  3. Drain sponges  4. Normal saline / Hydrogen peroxide or trach cleaning kit (?what will home health supply). If saline / peroxide, would mix 3 parts saline to 1 part peroxide for cleaning.  6. Long Q-tips for cleaning trach site - do not stick Q-tips in the tracheostomy opening  7. Passy Muir Valve - only to be worn when fully alert with full supervision from husband or RN  8. Suction device / suction catheters for PRN needs  9. Aerosolized tracheostomy collar with appropriate oxygen (28% documented at present)  HTN  -continue Aldactone, Avapro, and metoprolol tartrate . BP elevated. Dose adjusted.   Acute on chronic renal failure  Resolved   Hypernatremia  Resolved   Mild anemia,  Currently stable   Purulent tracheal secretions  No fever or resp distress  Completed 7 days levoflox on 8/11  -aggressive pulmonary toilet   DM2  -stable  -monitor CBG  -continue Lantus 40units   Dysphagia  -Status post gastrostomy  tube 05/27/2013  -bolus tube feeds  Thrush- dilfucan 2 weeks  Code Status: DNR   Family Communication: husband at bedside   dispo  SNF available but husband refuses for her to go there, then she needs to be discharged home with services.   Discharge Exam: Filed Vitals:   06/16/13 1400  BP: 140/57   Pulse: 64  Temp: 97.7 F (36.5 C)  Resp: 18   General: Middle aged obese female lying in bed in no acute distress  HEENT: No pallor, trach collar in place, moist mucosa  Chest: Diminished breath sounds bilaterally,  CVS: Normal S1 and S2, no murmurs rub or gallop  Abdomen: Soft, nontender, nondistended, bowel sounds present, G-tube in place Extremities: Trace edema, able to gently move her right extremity  CNS: Alert and awake, left hemiplegia     Discharge Instructions      Discharge Orders   Future Orders Complete By Expires   Discharge instructions  As directed    Comments:     Home health Trach care per home health Check blood sugars q6 hours Continue bolus tube feeds:  0800 1 can + 1 Prostat, 1200: 2 cans, 1600: 2 cans, 2000: 2 cans.  For a total of 7 cans daily.  Also 200 ml free water flushes QID to provide additional 800 ml free water daily   Discharge instructions  As directed    Comments:     6 cuffless tracheostomy (entire trach) to keep at bedside (in the event of dislodgement) 2. #6 cuffless inner cannulas for daily change 3. Drain sponges 4. Normal saline / Hydrogen peroxide or trach cleaning kit (?what will home health supply).  If saline / peroxide, would mix 3 parts saline to 1 part peroxide for cleaning.   6. Long Q-tips for cleaning trach site - do not stick Q-tips in the tracheostomy opening 7. Passy Muir Valve - only to be worn when fully alert with full supervision from husband or RN 8. Suction device / suction catheters for PRN needs 9. Aerosolized tracheostomy collar with appropriate oxygen (28% documented at present)   Increase activity slowly  As directed        Medication List    STOP taking these medications       albuterol (2.5 MG/3ML) 0.083% nebulizer solution  Commonly known as:  PROVENTIL     albuterol 108 (90 BASE) MCG/ACT inhaler  Commonly known as:  PROVENTIL HFA;VENTOLIN HFA     aspirin EC 81 MG tablet  Replaced by:  aspirin  325 MG tablet     buPROPion 75 MG tablet  Commonly known as:  WELLBUTRIN     candesartan 32 MG tablet  Commonly known as:  ATACAND  Replaced by:  irbesartan 300 MG tablet     clopidogrel 75 MG tablet  Commonly known as:  PLAVIX     EXCEDRIN MIGRAINE PO     fluticasone 50 MCG/ACT nasal spray  Commonly known as:  FLONASE     furosemide 40 MG tablet  Commonly known as:  LASIX     metoprolol succinate 100 MG 24 hr tablet  Commonly known as:  TOPROL-XL     omeprazole 40 MG capsule  Commonly known as:  PRILOSEC     promethazine 25 MG tablet  Commonly known as:  PHENERGAN     SALINE NA     TRILIPIX 135 MG capsule  Generic drug:  Choline Fenofibrate      TAKE these medications  acetaminophen 160 MG/5ML solution  Commonly known as:  TYLENOL  Place 15.6 mLs (500 mg total) into feeding tube every 4 (four) hours as needed for fever.     antiseptic oral rinse Liqd  15 mLs by Mouth Rinse route QID.     aspirin 325 MG tablet  Place 1 tablet (325 mg total) into feeding tube daily.     chlorhexidine 0.12 % solution  Commonly known as:  PERIDEX  Use as directed 15 mLs in the mouth or throat 2 (two) times daily.     feeding supplement (GLUCERNA 1.2 CAL) Liqd  Place 237 mLs into feeding tube daily.     feeding supplement (GLUCERNA 1.2 CAL) Liqd  Place 474 mLs into feeding tube 3 (three) times daily.     feeding supplement Liqd  Place 30 mL into feeding tube daily.     fluconazole 100 MG tablet  Commonly known as:  DIFLUCAN  Take 1 tablet (100 mg total) by mouth daily.     free water Soln  Place 200 mLs into feeding tube every 6 (six) hours.     insulin aspart 100 UNIT/ML injection  Commonly known as:  novoLOG  - CBG 70 - 120: 0 units  - CBG 121 - 150: 3 units  - CBG 151 - 200: 4 units  - CBG 201 - 250: 7 units  - CBG 251 - 300: 11 units  - CBG 301 - 350: 15 units  - CBG 351 - 400: 20 units     insulin glargine 100 UNIT/ML injection  Commonly known  as:  LANTUS  Inject 0.4 mLs (40 Units total) into the skin at bedtime.     irbesartan 300 MG tablet  Commonly known as:  AVAPRO  Place 1 tablet (300 mg total) into feeding tube daily.     lacosamide 200 MG Tabs tablet  Commonly known as:  VIMPAT  Place 1 tablet (200 mg total) into feeding tube 2 (two) times daily.     levETIRAcetam 100 MG/ML solution  Commonly known as:  KEPPRA  Place 10 mLs (1,000 mg total) into feeding tube 2 (two) times daily.     metoprolol tartrate 25 mg/10 mL Susp  Commonly known as:  LOPRESSOR  Place 20 mLs (50 mg total) into feeding tube 2 (two) times daily.     phenytoin 125 MG/5ML suspension  Commonly known as:  DILANTIN  Place 6 mLs (150 mg total) into feeding tube 3 (three) times daily.     spironolactone 25 MG tablet  Commonly known as:  ALDACTONE  Place 1 tablet (25 mg total) into feeding tube daily.       Allergies  Allergen Reactions  . Penicillins Nausea And Vomiting   Follow-up Information   Follow up with [facility] . (Home Health Speech Therapy, Respiratory Therapy, Physical Therapy and aide)    Contact information:   [phone]      Follow up with Bobbye Riggs, NP In 1 week.   Specialty:  Family Medicine   Contact information:   [address] [place] [address] [phone]        The results of significant diagnostics from this hospitalization (including imaging, microbiology, ancillary and laboratory) are listed below for reference.    Significant Diagnostic Studies: Ir Gastrostomy Tube Mod Sed  05/27/2013   *RADIOLOGY REPORT*  Indication:  Dysphagia  PULL TROUGH GASTOSTOMY TUBE PLACEMENT  Comparison: Abdominal radiograph - earlier same day  Medications:  Versed 2 mg  IV; Fentanyl 25 mcg IV; the patient is currently admitted to the hospital receiving intravenous antibiotics; Antibiotics were administered within 1 hour of the procedure.  Contrast volume:  20 mL Omnipaque-300 administered into the gastric  lumen  Sedation time: 35 minutes  Fluoroscopy time: 3 minutes, 36 seconds  Complications: None immediate  PROCEDURE/FINDINGS:  Informed written consent was obtained from the patient's family following explanation of the procedure, risks, benefits and alternatives.  A time out was performed prior to the initiation of the procedure.  Maximal barrier sterile technique utilized including caps, mask, sterile gowns, sterile gloves, large sterile drape, hand hygiene and Betadine prep.  The left upper quadrant was sterilely prepped and draped.  An oral gastric catheter was inserted into the stomach under fluoroscopy. The existing nasogastric feeding tube was removed.  The left costal margin and barium opacified transverse colon were identified and avoided.  Air was injected into the stomach for insufflation and visualization under fluoroscopy.  Under sterile conditions a 17 gauge trocar needle was utilized to access the stomach percutaneously beneath the left subcostal margin after the overlying soft tissues were anesthetized with 1% Lidocaine with epinephrine.  Needle position was confirmed within the stomach with aspiration of air and injection of small amount of contrast.   A single T tack was deployed for gastropexy.  Over an Amplatz guide wire, a 9-French sheath was inserted into the stomach.  A snare device was utilized to capture the oral gastric catheter.  The snare device was pulled retrograde from the stomach up the esophagus and out the oropharynx.  The 20-French pull-through gastrostomy was connected to the snare device and pulled antegrade through the oropharynx down the esophagus into the stomach and then through the percutaneous tract external to the patient.  The gastrostomy was assembled externally.  Contrast injection confirms position in the stomach.   Several spot radiographic images were obtained in various obliquities for documentation.  The patient tolerated procedure well without immediate post  procedural complication.  IMPRESSION:  Successful fluoroscopic insertion of a 20-French "pull-through" gastrostomy.   Original Report Authenticated By: Tacey Ruiz, MD   Dg Chest Port 1 View  06/01/2013   *RADIOLOGY REPORT*  Clinical Data: Respiratory failure  PORTABLE CHEST - 1 VIEW  Comparison: May 28, 2013  Findings: Tube and catheter positions are unchanged.  No pneumothorax.  There is patchy atelectasis in the bases, stable. No new opacity.  Heart size is upper normal with normal pulmonary vascularity.  No adenopathy.  IMPRESSION: Patchy bibasilar atelectatic change.  No change in the tube and catheter positions.  No pneumothorax.   Original Report Authenticated By: Bretta Bang, M.D.   Dg Chest Port 1 View  05/28/2013   *RADIOLOGY REPORT*  Clinical Data: Respiratory failure.  PORTABLE CHEST - 1 VIEW  Comparison: 05/27/2013  Findings: Tracheostomy tube and right PICC line are in place, unchanged.  Mild cardiomegaly.  Minimal bibasilar atelectasis.  No significant effusion.  No pneumothorax.  IMPRESSION: Bibasilar atelectasis.   Original Report Authenticated By: Charlett Nose, M.D.   Dg Chest Port 1 View  05/27/2013   *RADIOLOGY REPORT*  Clinical Data: Line placement  PORTABLE CHEST - 1 VIEW  Comparison: 05/26/2013  Findings: Right-sided central venous catheter tip is difficult to see but appears be within the SVC.  This overlies the feeding tube.  Tracheostomy in good position.  Left lower lobe atelectasis, increased in the interval.  IMPRESSION: PICC tip difficult to see but appears to enter the SVC.  Increased  left lower lobe atelectasis.   Original Report Authenticated By: Janeece Riggers, M.D.   Dg Chest Port 1 View  05/26/2013   *RADIOLOGY REPORT*  Clinical Data: Tracheostomy placement.  PORTABLE CHEST - 1 VIEW  Comparison: 05/25/2013.  Findings: Tracheostomy is present.  Feeding tube is also present with the tip not visualized.  Patient is rotated to the left. Allowing for rotation, there is no  interval change.  Bilateral basilar atelectasis is present, prominent in the retrocardiac region.  No airspace disease/consolidation.  No pneumothorax. Monitoring leads are projected over the chest.  IMPRESSION: Unchanged support apparatus.  Basilar atelectasis.   Original Report Authenticated By: Andreas Newport, M.D.   Chest Portable 1 View To Assess Tube Placement And Rule-out Pneumothorax  05/25/2013   *RADIOLOGY REPORT*  Clinical Data: Tracheostomy tube placement  PORTABLE CHEST - 1 VIEW  Comparison: Same date.  Findings: Tracheostomy tube is seen in grossly good position with distal tip approximately 2 cm above the carina.  Dobbhoff tube is seen passing through esophagus into stomach.  No pneumothorax is noted.  No acute pulmonary disease is noted.  Bony thorax is intact.  IMPRESSION: Tracheostomy tube in grossly good position.  No pneumothorax is noted.   Original Report Authenticated By: Lupita Raider.,  M.D.   Dg Chest Port 1 View  05/25/2013   *RADIOLOGY REPORT*  Clinical Data: Evaluate endotracheal tube position.  PORTABLE CHEST - 1 VIEW  Comparison: Chest x-ray 05/24/2013.  Findings: An endotracheal tube is in place with tip 3.8 cm above the carina. A feeding tube is seen extending into the abdomen, however, the tip of the feeding tube extends below the lower margin of the image.  Lung volumes are low.  The left basilar opacity favored to represent subsegmental atelectasis.  No definite consolidative airspace disease.  Possible trace left pleural effusion.  No evidence of pulmonary edema.  Heart size and mediastinal contours are within normal limits allowing for patient rotation to the left.  Atherosclerosis of the thoracic aorta.  IMPRESSION: 1.  Support apparatus, as above. 2.  Persistent left lower lobe subsegmental atelectasis and trace left pleural effusion. 3.  Atherosclerosis.   Original Report Authenticated By: Trudie Reed, M.D.   Dg Chest Port 1 View  05/24/2013   *RADIOLOGY REPORT*   Clinical Data: Endotracheal tube placement  PORTABLE CHEST - 1 VIEW  Comparison: Prior chest x-ray 05/22/2013  Findings: Endotracheal tube is 3.5 cm above the carina.  The enteric feeding tube tip is not visualized but lies below the diaphragm, presumably within the stomach or proximal small bowel. Stable appearance of the chest with bilateral layering pleural effusions and associated bibasilar opacities.  Unchanged cardiomegaly.  No pneumothorax or acute osseous abnormality.  IMPRESSION: No significant interval change in the appearance of chest.   Original Report Authenticated By: Malachy Moan, M.D.   Dg Chest Port 1 View  05/22/2013   *RADIOLOGY REPORT*  Clinical Data: Check endotracheal tube.  PORTABLE CHEST - 1 VIEW  Comparison: 05/19/2013.  Findings: ETT ends at the level of the clavicular heads.  A feeding tube crosses the diaphragm.  Low lung volumes with vessel crowding, similar to previous.  No evidence of pneumothorax or significant effusion.  Normal heart size when accounting for respiratory volumes.  IMPRESSION:  1.  Good positioning of endotracheal and enteric tubes. 2.  Low lung volumes.   Original Report Authenticated By: Tiburcio Pea   Dg Chest Port 1 View  05/19/2013   *RADIOLOGY REPORT*  Clinical Data:  Respiratory failure, follow-up  PORTABLE CHEST - 1 VIEW  Comparison: Portable chest x-ray of 05/14/2013  Findings: The tip of the endotracheal tube remains approximately 5.2 cm above the carina.  The lungs appear well aerated.  Mild cardiomegaly is stable.  IMPRESSION: Slightly better aeration.  No change in position of endotracheal tube.   Original Report Authenticated By: Dwyane Dee, M.D.   Dg Abd Portable 1v  05/27/2013   *RADIOLOGY REPORT*  Clinical Data: Evaluate barium.  PORTABLE ABDOMEN - 1 VIEW  Comparison: 05/20/2013.  Findings: Weighted feeding tube is present with the tip in the antrum of the stomach.  Barium outlines the colon.  Small amount of residual barium is present  within the small bowel.  Bowel gas pattern is nonobstructive. The nasogastric tube appears to have been removed.  IMPRESSION: Barium outlines the colon.  Feeding tube tip in the antrum of the stomach.   Original Report Authenticated By: Andreas Newport, M.D.   Dg Abd Portable 1v  05/20/2013   *RADIOLOGY REPORT*  Clinical Data: Feeding tube placement  PORTABLE ABDOMEN - 1 VIEW  Comparison: May 07, 2013.  Findings: No abnormal bowel gas pattern is noted.  Dobbhoff tube tip is seen in expected position of the gastric body.  Nasogastric tube tip is seen in distal stomach.  IMPRESSION: No evidence of bowel obstruction or ileus.  Nasogastric tube tip seen in distal stomach.  Distal tip of Dobbhoff tube seen in body of stomach.   Original Report Authenticated By: Lupita Raider.,  M.D.    Microbiology: Recent Results (from the past 240 hour(s))  URINE CULTURE     Status: None   Collection Time    06/10/13  1:04 AM      Result Value Range Status   Specimen Description URINE, CATHETERIZED   Final   Special Requests CX ADDED AT 0129 ON 191478   Final   Culture  Setup Time     Final   Value: 06/10/2013 02:38     Performed at [facility]   Colony Count     Final   Value: >=100,000 COLONIES/ML     Performed at [facility]   Culture     Final   Value: Multiple bacterial morphotypes present, none predominant. Suggest appropriate recollection if clinically indicated.     Performed at [facility]   Report Status 06/11/2013 FINAL   Final     Labs: Basic Metabolic Panel:  Recent Labs Lab 06/11/13 1015  NA 137  K 4.5  CL 101  CO2 31  GLUCOSE 134*  BUN 19  CREATININE 0.37*  CALCIUM 8.9   Liver Function Tests: No results found for this basename: AST, ALT, ALKPHOS, BILITOT, PROT, ALBUMIN,  in the last 168 hours No results found for this basename: LIPASE, AMYLASE,  in the last 168 hours No results found for this basename: AMMONIA,  in the last 168 hours CBC:  Recent  Labs Lab 06/11/13 1015  WBC 9.6  HGB 8.8*  HCT 25.9*  MCV 89.6  PLT 235   Cardiac Enzymes: No results found for this basename: CKTOTAL, CKMB, CKMBINDEX, TROPONINI,  in the last 168 hours BNP: BNP (last 3 results) No results found for this basename: PROBNP,  in the last 8760 hours CBG:  Recent Labs Lab 06/15/13 2004 06/15/13 2349 06/16/13 0404 06/16/13 0808 06/16/13 1129  GLUCAP 166* 198* 106* 98 164*       Signed:  Marlin Canary  [facility] 06/16/2013, 3:40 PM

## 2013-06-15 LAB — GLUCOSE, CAPILLARY
Glucose-Capillary: 162 mg/dL — ABNORMAL HIGH (ref 70–99)
Glucose-Capillary: 166 mg/dL — ABNORMAL HIGH (ref 70–99)
Glucose-Capillary: 168 mg/dL — ABNORMAL HIGH (ref 70–99)
Glucose-Capillary: 198 mg/dL — ABNORMAL HIGH (ref 70–99)

## 2013-06-15 MED ORDER — GLUCERNA 1.2 CAL PO LIQD
237.0000 mL | ORAL | Status: DC
  Administered 2013-06-16 – 2013-06-17 (×2): 237 mL
  Filled 2013-06-15 (×3): qty 237

## 2013-06-15 MED ORDER — GLUCERNA 1.2 CAL PO LIQD
474.0000 mL | Freq: Three times a day (TID) | ORAL | Status: DC
  Administered 2013-06-15: 120 mL
  Administered 2013-06-15: 360 mL
  Administered 2013-06-15: 237 mL
  Administered 2013-06-16 (×3): 474 mL
  Filled 2013-06-15 (×11): qty 474

## 2013-06-15 MED ORDER — FREE WATER
200.0000 mL | Freq: Four times a day (QID) | Status: DC
  Administered 2013-06-15 – 2013-06-17 (×8): 200 mL

## 2013-06-15 MED ORDER — FLUCONAZOLE 100 MG PO TABS
100.0000 mg | ORAL_TABLET | Freq: Every day | ORAL | Status: DC
  Administered 2013-06-15 – 2013-06-17 (×3): 100 mg via ORAL
  Filled 2013-06-15 (×4): qty 1

## 2013-06-15 MED ORDER — PRO-STAT SUGAR FREE PO LIQD
30.0000 mL | Freq: Every day | ORAL | Status: DC
  Administered 2013-06-16 – 2013-06-17 (×2): 30 mL
  Filled 2013-06-15 (×3): qty 30

## 2013-06-15 NOTE — Progress Notes (Signed)
Patient's husband performed trach care this morning with RN supervision. Husband did very well and he is comfortable with trach care.

## 2013-06-15 NOTE — Progress Notes (Addendum)
NUTRITION FOLLOW UP  Intervention:   1. D/c current TF regimen  2. Initiate bolus regimen.  First bolus: 150 ml (~1/2 can) Glucerna 1.2, Second Bolus: 237 ml (1 can) Glucerna 1.2, Third bolus: 375 ml (~1.5 cans) Glucerna 1.2, Fourth bolus: 474 ml (2 cans) Glucerna 1.2  3.Continue 30 ml Pro-stat per tube once daily.  4.  Feeding Regimen at D/C should be as follows: 0800 1 can + 1 Prostat, 1200: 2 cans, 1600: 2 cans, 2000: 2 cans.  For a total of 7 cans daily.   5. Also 200 ml free water flushes QID to provide additional 800 ml free water daily.   This regimen will provide 2095 kcal, 114 gm protein, and 2144 ml free water.    Nutrition Dx:   Inadequate oral intake related to inability to eat as evidenced by NPO status; ongoing.   Goal:   Pt to meet >/= 90% of their estimated nutrition needs. Met   Monitor:   TF tolerance/adequacy, labs, weight trend, vent status, overall goals of care.  Assessment:   Pt admitted with small acute infarcts and seizures. Pt s/p trach 7/30 and PEG 8/1.  Per husband pt with liable blood sugars PTA. Stated that it was a miracle when her blood sugar was < 200.   Pt has trach in place, no vent support. Continues to work with SLP for PMV use. Making progress but not yet ready to transition to oral intake.   Patient has PEG in place. Jevity 1.2 running at 70 ml/hr. No issues with TF noted.  Planned for D/C home on 8/21. RD paged by MD for transition to bolus enteral nutrition. Discussed regimen with RN and pt's husband. RN to provide teaching to husband. Home health agency will be able to adjust TF regimen as needed and provide support to husband as needed.   Height: Ht Readings from Last 1 Encounters:  05/02/13 6' (1.829 m)    Weight Status:  Trending up with positive fluid status. No new weight since 8/5.  Wt Readings from Last 1 Encounters:  05/31/13 273 lb 13 oz (124.2 kg)  with noted 2+edema  05/07/13  254 lb 3.1 oz (115.3 kg)  05/06/13  257 lb  0.9 oz (116.6 kg)  05/02/13  238 lb 1.6 oz (108 kg)   Re-estimated needs:  Kcal: 2050-2250 Protein: 100-120 grams Fluid: > 2 L/day  Skin: skin tear on L groin    Diet Order: NPO  No intake or output data in the 24 hours ending 06/15/13 1057  Last BM: 8/20  Labs:   Recent Labs Lab 06/11/13 1015  NA 137  K 4.5  CL 101  CO2 31  BUN 19  CREATININE 0.37*  CALCIUM 8.9  GLUCOSE 134*    CBG (last 3)   Recent Labs  06/14/13 1958 06/15/13 06/15/13 0403  GLUCAP 140* 168* 148*    Scheduled Meds: . antiseptic oral rinse  1 application Mouth Rinse QID  . aspirin  325 mg Per Tube Daily  . chlorhexidine  15 mL Mouth/Throat BID  . enoxaparin (LOVENOX) injection  40 mg Subcutaneous Q24H  . feeding supplement  30 mL Per Tube Daily  . fluconazole  100 mg Oral Daily  . free water  100 mL Per Tube Q8H  . insulin aspart  0-20 Units Subcutaneous Q4H  . insulin glargine  40 Units Subcutaneous QHS  . irbesartan  300 mg Per Tube Daily  . lacosamide  200 mg Per Tube BID  .  levETIRAcetam  1,000 mg Per Tube BID  . metoprolol tartrate  50 mg Per Tube BID  . phenytoin  150 mg Per Tube TID  . sodium chloride  10-40 mL Intracatheter Q12H  . spironolactone  25 mg Per Tube Daily    Continuous Infusions: . feeding supplement (GLUCERNA 1.2 CAL) 1,000 mL (06/15/13 1051)    Clarene Duke RD, LDN Pager [phone] After Hours pager [phone]

## 2013-06-15 NOTE — Progress Notes (Signed)
Teaching instructions given to husband on trach care and peg tube bolus feedings. Husband demonstrated well. Also, husband was able to draw up patient's sliding scale insulin and administered safely.

## 2013-06-15 NOTE — Clinical Social Work Note (Addendum)
°  CSW met with pt's husband at bedside. CSW presented pt's husband with SNF bed offer with [facility] [place]. [name] declined the bed offer. CSW explained to pt's husband that his denial of the SNF placement meant that pt would be discharged on Thursday (06/16/2013). [name] was agreeable and understanding of discharge plan. CSW informed MD and Case Management of information above.  CSW signing off. Please re-consult if any new concerns arise.  Darlyn Chamber, MSW, LCSWA Clinical Social Work [phone]

## 2013-06-15 NOTE — Progress Notes (Signed)
Physical Therapy Treatment Patient Details Name: Brenda Pope MRN: [id number] DOB: Jul 07, 1956 Today's Date: 06/15/2013 Time: 8295-6213 PT Time Calculation (min): 50 min  PT Assessment / Plan / Recommendation  History of Present Illness Pt admitted with CVA.  Intubated 7/6 for airway protection following suspected seizure activity. Hypotensive post intubation with vasopressors.  MRI revealed small acute infarcts R parietal lobe. Pt has been comatose most of this hospitalization.7/20 - MRI brain: Overall mildly regressed signal abnormality in the brain associated with multi focal infarcts. The most confluent infarcts in the right posterior MCA and PCA territories do demonstrate persistent restricted diffusion, but less cytotoxic edema. There is trace petechial hemorrhage, but no malignant hemorrhagic transformation or associated mass effect. 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 The most confluent infarcts in the right posterior MCA and PCA territories do demonstrate persistent restricted diffusion, but less cytotoxic edema. There is trace petechial hemorrhage, but no malignant hemorrhagic transformation or associated mass effect. 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 The most confluent infarcts in the right posterior MCA and PCA territories do demonstrate persistent restricted diffusion, but less cytotoxic edema. There is trace petechial hemorrhage, but no malignant hemorrhagic transformation or associated mass effect. 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 The most confluent infarcts in the right posterior MCA and PCA territories do demonstrate persistent restricted diffusion, but less cytotoxic edema. There is trace petechial hemorrhage, but no malignant hemorrhagic transformation or associated mass effect. 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 The most confluent infarcts in the right posterior MCA and PCA territories do demonstrate persistent restricted diffusion, but less cytotoxic edema. Ther   PT Comments   Continued emphasis on sitting balance, cervical/spinal extension and activation of trunk and other large muscle groups   Follow Up Recommendations  Supervision/Assistance - 24 hour;SNF;LTACH     Does the patient have the potential to tolerate intense rehabilitation     Barriers to Discharge        Equipment Recommendations  Wheelchair (measurements PT);Wheelchair cushion (measurements PT);Hospital bed;Other (comment) (hoyer lift and pads)    Recommendations for Other Services    Frequency Min 3X/week   Progress towards PT Goals Progress towards PT goals: Progressing toward goals  Plan Current plan remains appropriate    Precautions / Restrictions Precautions Precautions: Fall Restrictions Weight Bearing Restrictions: No   Pertinent Vitals/Pain     Mobility  Bed Mobility Bed Mobility: Rolling Right;Right Sidelying to Sit;Sitting - Scoot to [place] of Bed;Sit to Sidelying Right Rolling Right: 1: +2 Total assist Rolling Right: Patient Percentage: 0% Right Sidelying to Sit: 1: +2 Total assist Right Sidelying to Sit: Patient Percentage: 10% Sitting - Scoot to Edge of Bed: 1: +2 Total  assist Sitting - Scoot to Edge of Bed: Patient Percentage: 0% Sit to Sidelying Right: 1: +2 Total assist;HOB flat Sit to Sidelying Right:  Patient Percentage: 10% Scooting to HOB: 1: +2 Total assist Scooting to [facility]: Patient Percentage: 0% Details for Bed Mobility Assistance: pt assisted minimally  with R UE Transfers Transfers: Not assessed Ambulation/Gait Ambulation/Gait Assistance: Not tested (comment) Wheelchair Mobility Wheelchair Mobility: No Modified Rankin (Stroke Patients Only) Pre-Morbid Rankin Score: No symptoms Modified Rankin: Severe disability    Exercises Other Exercises Other Exercises: PROM Bil LE Other Exercises: P/AAROM Bil UE's   PT Diagnosis:    PT Problem List:   PT Treatment Interventions:     PT Goals (current goals can now be found in the care plan section) Acute Rehab PT Goals PT Goal Formulation: With family Potential to Achieve Goals: Fair  Visit Information  Last PT Received On: 06/15/13 Assistance Needed: +2 History of Present Illness: Pt admitted with CVA.  Intubated 7/6 for airway protection following suspected seizure activity. Hypotensive post intubation with vasopressors.  MRI revealed small acute infarcts R parietal lobe. Pt has been comatose most of this hospitalization.7/20 - MRI brain: Overall mildly regressed signal abnormality in the brain associated with multi focal infarcts. The most confluent infarcts in the right posterior MCA and PCA territories do demonstrate persistent restricted diffusion, but less cytotoxic edema. There is trace petechial hemorrhage, but no malignant hemorrhagic transformation or associated mass effect. 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 The most confluent infarcts in the right posterior MCA and PCA territories do  demonstrate persistent restricted diffusion, but less cytotoxic edema. There is trace petechial hemorrhage, but no malignant hemorrhagic transformation or associated mass effect. 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 The most confluent infarcts in the right posterior MCA and PCA territories do demonstrate persistent restricted diffusion, but less cytotoxic edema. There is trace petechial hemorrhage, but no malignant hemorrhagic transformation or associated mass effect. 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 The most confluent infarcts in the right posterior MCA and PCA territories do demonstrate persistent restricted diffusion, but less cytotoxic edema. There is trace petechial hemorrhage, but no malignant hemorrhagic transformation or associated mass effect. 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 The most confluent infarcts in the right posterior MCA and PCA territories do demonstrate persistent restricted diffusion, but less cytotoxic edema. Ther    Subjective Data  Subjective: Let me lay down   Cognition  Cognition Arousal/Alertness: Awake/alert Behavior During Therapy: Flat affect Overall Cognitive Status: Within Functional Limits for tasks assessed Area of Impairment: Attention Following Commands: Follows one step commands consistently;Follows multi-step commands inconsistently Problem Solving: Slow  processing;Decreased initiation;Requires verbal cues    Balance  Balance Balance Assessed: Yes Static Sitting Balance Static Sitting - Balance Support: Left upper extremity supported Static Sitting - Level of Assistance: 1: +1 Total assist Static Sitting - Comment/# of Minutes: sat EOB for >15 min continuing to work on muscle activation, trunk/cervical extension with rotation.  Noted some obvious  moments of good R side extension and assist with R UE.  Atempted to elicit LE movement esp with LAQ.  End of Session PT - End of Session Equipment Utilized During Treatment: Oxygen Activity Tolerance: Patient limited by fatigue Patient left: in bed;with call bell/phone within reach;with family/visitor present Nurse Communication: Mobility status   GP     Brenda Pope, Eliseo Gum 06/15/2013, 4:10 PM 06/15/2013  Ashley Bing, PT [phone]  (pager)

## 2013-06-15 NOTE — Progress Notes (Signed)
Passy-Muir Speaking Valve - Treatment Patient Details  Name: Brenda Pope MRN: [id number] Date of Birth: [birth year]  Today's Date: 06/15/2013 Time: 1230-1252 SLP Time Calculation (min): 22 min  Past Medical History:  Past Medical History  Diagnosis Date  . Stroke   . Diabetes mellitus without complication   . Hypertension   . CHF (congestive heart failure)    Past Surgical History: History reviewed. No pertinent past surgical history.  Assessment / Plan / Recommendation Clinical Impression  Per husband, pt using valve with greater frequency in last several days.  Pt fatigued - donned valve briefly during our session  but asked to have it removed due to difficulty staying alert/awake.  Session focused on family ed for D/C home, potentially tomorrow.  Reviewed use/care of valve, the necessity of replacement valve several months from now if pt is not decannulated before then.  Pt to be D/Cd with #6 cuffless trach.  Discussed the need for instrumental swallow study as pt shows signs of readiness.  Pt will need HHSLP to continue to address speech, cognition, and ultimately swallowing.  Pt's spouse verbalizes/demonstrates understanding of recommendations.      Plan  Goals met   Follow Up Recommendations  Home health SLP    Pertinent Vitals/Pain No c/o pain    SLP Goals SLP Goal #1: Pt will selectively attend to target stimuli in quiet environment for ten minutes with 80% accuracy. SLP Goal #1 - Progress: Met SLP Goal #2: Pt will use alternative methods to gain attention/call staff when valve not in place 3x per session. SLP Goal #2 - Progress: Met SLP Goal #3: Pt will utilize upper airway for cough/phonation with max cues and stable VS with PMSV in place SLP Goal #3 - Progress: Met SLP Goal #4: Pt will use PMSV and increase intelligibility to 90% with appropriate respiratory pacing.  SLP Goal #4 - Progress: Met SLP Goal #5: Pt will verbally sequence steps to functional tasks with 90%  accuracy. SLP Goal #5 - Progress: Partly met   PMSV Trial  PMSV was placed for: 2 minutes Able to redirect subglottic air through upper airway: Yes Able to Attain Phonation: Yes Voice Quality: Aphonic;Hoarse Able to Expectorate Secretions: No Breath Support for Phonation: Mildly decreased Intelligibility: Intelligible Word: 75-100% accurate Phrase: 75-100% accurate Sentence: 75-100% accurate SpO2 During Trial: 100 % Behavior: Cooperative;Responsive to questions            Brenda Pope 06/15/2013, 12:59 PM

## 2013-06-15 NOTE — Progress Notes (Signed)
[facility] PROGRESS NOTE  Brenda Pope ZOX:[id number] DOB: [birth year] DOA: 05/02/2013 PCP: Bobbye Riggs, NP  Brief narrative  Please review hospitalists progress note from 8/13 for detailed hospital course but in brief 57 y/o female with hx of CVA, diastolic CHF, HTN admitted with acute CVA with prolonged hospital course including seizures, encephalopathy,a cute resp failure requiring ventilation followed by trach and dysphagia requiring PEG tube.   Assessment/Plan:  Acute CVA in multiple territories, unclear etiology  Concern for CNS vasculitis but no supportive evidence found after workup . She received pulse dose steroid initially. W/up including CSF analysis and imaging indicating atherosclerosis rather than vasculitis. no further studies to offer at this time  -neurology signed off on 06/02/2013  -Continue aspirin  -has residual left sided hemiplegia and moderate right sided weakness as well. PT following and patient very slowly participating.   Status epilepticus  Resolved.cont triple AEDs for now   Persistent coma/acute encephalopathy  Secondary to above. Now resolved   Acute respiratory failure due to AMS - s/p tracheostomy per PCCM  -Changed to 6 cuffless per PCCM - asked them to place a note in the chart - progress PMV when stronger. will need trach clinic f/u in 6 wks after discharge  -clinically stable on trach collar .  HTN  -continue Aldactone, Avapro, and metoprolol tartrate . BP stable after adjusting metoprolol dose.   Acute on chronic renal failure  Resolved   Hypernatremia  Resolved   Mild anemia, Currently stable   Purulent tracheal secretions  No fever or resp distress  Completed 7 days levoflox on 8/11  -aggressive pulmonary toilet   DM2  -stable  -monitor CBG  -continue Lantus 40units   Dysphagia  -Status post gastrostomy tube 05/27/2013  -Tolerating Glucerna at 70 cc per hour -will change to bolus feeds   Code Status: DNR  Family  Communication: husband at bedside   dispo  Husband refused SNf- will take home with home health  HPI/Subjective:  No overnight issues.   Objective: Filed Vitals:   06/15/13 1000  BP: 157/66  Pulse: 84  Temp: 97.8 F (36.6 C)  Resp: 20   No intake or output data in the 24 hours ending 06/15/13 1044 Filed Weights   05/29/13 0400 05/30/13 0500 05/31/13 0100  Weight: 122.6 kg (270 lb 4.5 oz) 123 kg (271 lb 2.7 oz) 124.2 kg (273 lb 13 oz)    Exam: General: Middle aged obese female lying in bed in no acute distress  HEENT: No pallor, trach collar in place, moist mucosa  Chest: Diminished breath sounds bilaterally,  CVS: Normal S1 and S2, no murmurs rub or gallop  Abdomen: Soft, nontender, nondistended, bowel sounds present, G-tube in place Extremities: Trace edema, able to gently move her right extremity  CNS: Alert and awake, left hemiplegia    Data Reviewed: Basic Metabolic Panel:  Recent Labs Lab 06/11/13 1015  NA 137  K 4.5  CL 101  CO2 31  GLUCOSE 134*  BUN 19  CREATININE 0.37*  CALCIUM 8.9   Liver Function Tests: No results found for this basename: AST, ALT, ALKPHOS, BILITOT, PROT, ALBUMIN,  in the last 168 hours No results found for this basename: LIPASE, AMYLASE,  in the last 168 hours No results found for this basename: AMMONIA,  in the last 168 hours CBC:  Recent Labs Lab 06/11/13 1015  WBC 9.6  HGB 8.8*  HCT 25.9*  MCV 89.6  PLT 235   Cardiac Enzymes: No results found for this  basename: CKTOTAL, CKMB, CKMBINDEX, TROPONINI,  in the last 168 hours BNP (last 3 results) No results found for this basename: PROBNP,  in the last 8760 hours CBG:  Recent Labs Lab 06/14/13 1128 06/14/13 1620 06/14/13 1958 06/15/13 06/15/13 0403  GLUCAP 107* 173* 140* 168* 148*    Recent Results (from the past 240 hour(s))  URINE CULTURE     Status: None   Collection Time    06/10/13  1:04 AM      Result Value Range Status   Specimen Description URINE,  CATHETERIZED   Final   Special Requests CX ADDED AT 0129 ON [id number]   Final   Culture  Setup Time     Final   Value: 06/10/2013 02:38     Performed at [facility]   Colony Count     Final   Value: >=100,000 COLONIES/ML     Performed at [facility]   Culture     Final   Value: Multiple bacterial morphotypes present, none predominant. Suggest appropriate recollection if clinically indicated.     Performed at [facility]   Report Status 06/11/2013 FINAL   Final     Studies: No results found.  Scheduled Meds: . antiseptic oral rinse  1 application Mouth Rinse QID  . aspirin  325 mg Per Tube Daily  . chlorhexidine  15 mL Mouth/Throat BID  . enoxaparin (LOVENOX) injection  40 mg Subcutaneous Q24H  . feeding supplement  30 mL Per Tube Daily  . fluconazole  100 mg Oral Daily  . free water  100 mL Per Tube Q8H  . insulin aspart  0-20 Units Subcutaneous Q4H  . insulin glargine  40 Units Subcutaneous QHS  . irbesartan  300 mg Per Tube Daily  . lacosamide  200 mg Per Tube BID  . levETIRAcetam  1,000 mg Per Tube BID  . metoprolol tartrate  50 mg Per Tube BID  . phenytoin  150 mg Per Tube TID  . sodium chloride  10-40 mL Intracatheter Q12H  . spironolactone  25 mg Per Tube Daily   Continuous Infusions: . feeding supplement (GLUCERNA 1.2 CAL) 1,000 mL (06/14/13 0237)      Time spent: 25 minutes    Deagan Sevin  [facility] Pager [phone] If 7PM-7AM, please contact night-coverage at [email or web address], password [facility] 06/15/2013, 10:44 AM  LOS: 44 days

## 2013-06-15 NOTE — Progress Notes (Signed)
Chaplain attempted to speak to pt's husband at Director's request. Director aware that pt's husband feeling stressed about prospect of pt's need for full-time continuing care at home after discharge. [name] respectfully declined my overture to visit but said I could come later in the day.

## 2013-06-15 NOTE — Progress Notes (Addendum)
Called by Dr. Benjamine Mola for home recommendations for home tracheostomy needs.   Home Tracheostomy Supply List 1. # 6 cuffless tracheostomy (entire trach) to keep at bedside (in the event of dislodgement) 2. #6 cuffless inner cannulas for daily change 3. Drain sponges 4. Normal saline / Hydrogen peroxide or trach cleaning kit (?what will home health supply).  If saline / peroxide, would mix 3 parts saline to 1 part peroxide for cleaning.  6. Long Q-tips for cleaning trach site - do not stick Q-tips in the tracheostomy opening 7. Passy Muir Valve - only to be worn when fully alert with full supervision from husband or RN 8. Suction device / suction catheters for PRN needs 9. Aerosolized tracheostomy collar with appropriate oxygen (28% documented at present) 10.  Any other needs identified by home care agency    Can follow up in tracheostomy clinic for trach change with Dr. Tyson Alias in 1-2 months.  # [phone]   Canary Brim, NP-C  Pulmonary & Critical Care Pgr: [phone] or [phone]

## 2013-06-15 NOTE — Procedures (Signed)
Bedside Tracheostomy Insertion Procedure Note   Patient Details:   Name: Brenda Pope DOB: September 11, 1956 MRN: [id number]  Procedure: Janina Mayo change  Post Procedure Assessment: BP 146/59  Pulse 76  Temp(Src) 98.6 F (37 C) (Oral)  Resp 20  Ht 6' (1.829 m)  Wt 273 lb 13 oz (124.2 kg)  BMI 37.13 kg/m2  SpO2 100% O2 sats: stable throughout and currently acceptable Complications: No apparent complications Patient did tolerate procedure well Tracheostomy Brand:Shiley Tracheostomy Style:Uncuffed Tracheostomy Size: 6 Tracheostomy Secured via: Trach Ties Tracheostomy Placement Confirmation: postive color change on ETCO2 detector, BIL bs present and equal, SpO2 100% on 28% ATC.  Janina Mayo was changed out with suction catheter used as guide.  Janina Mayo was changed by myself and Jannifer Rodney, RRT, RCP.    Antoine Poche 06/15/2013, 5:20 PM

## 2013-06-15 NOTE — Progress Notes (Signed)
Patient's husband performed tracheostomy care this evening. Very little intervention given by RN. [name] seemed to retain much from earlier teaching session. Patient's husband also trained on administering medication through PEG tube. Very eager to learn and did very well with little intervention from RN.

## 2013-06-15 NOTE — Progress Notes (Signed)
Occupational Therapy Treatment Patient Details Name: Brenda Pope MRN: [id number] DOB: November 16, 1955 Today's Date: 06/15/2013 Time: 1191-4782 OT Time Calculation (min): 49 min  OT Assessment / Plan / Recommendation  History of present illness Pt admitted with CVA.  Intubated 7/6 for airway protection following suspected seizure activity. Hypotensive post intubation with vasopressors.  MRI revealed small acute infarcts R parietal lobe. Pt has been comatose most of this hospitalization.7/20 - MRI brain: Overall mildly regressed signal abnormality in the brain associated with multi focal infarcts. The most confluent infarcts in the right posterior MCA and PCA territories do demonstrate persistent restricted diffusion, but less cytotoxic edema. There is trace petechial hemorrhage, but no malignant hemorrhagic transformation or associated mass effect. 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 The most confluent infarcts in the right posterior MCA and PCA territories do demonstrate persistent restricted diffusion, but less cytotoxic edema. There is trace petechial hemorrhage, but no malignant hemorrhagic transformation or associated mass effect. 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 The most confluent infarcts in the right posterior MCA and PCA territories do demonstrate persistent restricted diffusion, but less cytotoxic edema. There is trace petechial hemorrhage, but no malignant hemorrhagic transformation or associated mass effect. 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 The most confluent infarcts in the right posterior MCA and PCA territories do demonstrate persistent restricted diffusion, but less cytotoxic edema. There is trace petechial hemorrhage, but no malignant hemorrhagic transformation or associated mass effect. 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 The most confluent infarcts in the right posterior MCA and PCA territories do demonstrate persistent restricted diffusion, but less cytotoxic edema. Ther   OT comments  Pt continues to demonstrate slow, steady progress with Rt. UE AROM and trunk control/sitting balance, and activity tolerance  Follow Up Recommendations  Home health OT;Supervision/Assistance - 24 hour    Barriers to Discharge       Equipment Recommendations  3 in 1 bedside comode;Hospital bed;Wheelchair (measurements OT);Wheelchair cushion (measurements OT)    Recommendations for Other Services    Frequency Min 2X/week   Progress towards OT Goals Progress towards OT goals: Progressing toward goals  Plan Discharge plan remains appropriate    Precautions / Restrictions Precautions Precautions: Fall Precaution Comments: trach with 02 Restrictions Weight Bearing Restrictions: No   Pertinent Vitals/Pain     ADL  Grooming: Wash/dry face;+1 Total assistance Where Assessed - Grooming: Supported sitting ADL Comments: Pt moved to EOB sitting with total A +2 - see below for comments.     OT Diagnosis:    OT Problem List:   OT Treatment Interventions:     OT Goals(current goals can now be found in the care plan section) Acute Rehab OT Goals Patient Stated Goal: husband is seeking post acute  rehab and eventual return home OT Goal Formulation: With patient/family Time For Goal Achievement: 06/16/13 Potential  to Achieve Goals: Good ADL Goals Additional ADL Goal #1: Pt will sit EOB x 10 minutes with moderate assistance and head at midline.  Visit Information  Last OT Received On: 06/15/13 Assistance Needed: +2 History of Present Illness: Pt admitted with CVA.  Intubated 7/6 for airway protection following suspected seizure activity. Hypotensive post intubation with vasopressors.  MRI revealed small acute infarcts R parietal lobe. Pt has been comatose most of this hospitalization.7/20 - MRI brain: Overall mildly regressed signal abnormality in the brain associated with multi focal infarcts. The most confluent infarcts in the right posterior MCA and PCA territories do demonstrate persistent restricted diffusion, but less cytotoxic edema. There is trace petechial hemorrhage, but no malignant hemorrhagic transformation or associated mass effect. 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 The most confluent infarcts in the right posterior MCA and PCA territories do demonstrate persistent restricted diffusion, but less cytotoxic edema. There is trace petechial hemorrhage, but no malignant hemorrhagic transformation or associated mass effect. 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 The most confluent infarcts in the right posterior MCA and PCA territories do demonstrate persistent restricted diffusion, but less cytotoxic edema. There is trace petechial hemorrhage, but no malignant hemorrhagic transformation or  associated mass effect. 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 The most confluent infarcts in the right posterior MCA and PCA territories do demonstrate persistent restricted diffusion, but less cytotoxic edema. There is trace petechial hemorrhage, but no malignant hemorrhagic transformation or associated mass effect. 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 The most confluent infarcts in the right posterior MCA and PCA territories do demonstrate persistent restricted diffusion, but less cytotoxic edema. Ther    Subjective Data      Prior Functioning       Cognition  Cognition Arousal/Alertness: Awake/alert Behavior During Therapy: Flat affect Overall Cognitive Status: Within Functional Limits for tasks assessed Area of Impairment: Attention Following Commands: Follows one step commands consistently;Follows multi-step commands inconsistently Problem Solving: Slow processing;Decreased initiation;Requires verbal cues    Mobility  Bed Mobility Bed Mobility: Rolling Right;Right Sidelying to Sit;Sitting - Scoot to [place] of Bed;Sit to Sidelying Right Rolling Right: 1: +2 Total assist Rolling Right: Patient Percentage: 0% Right Sidelying to Sit: 1: +2 Total assist Right Sidelying to Sit: Patient Percentage: 10% Sitting - Scoot to Edge of Bed: 1: +2 Total assist Sitting - Scoot to Edge of Bed: Patient Percentage: 0% Sit to Sidelying Right: 1: +2 Total assist;HOB flat Sit to Sidelying Right: Patient Percentage: 10% Scooting to HOB: 1: +2 Total assist Scooting to [facility]: Patient Percentage: 0% Details for  Bed Mobility Assistance: pt assisted minimally  with R UE    Exercises  Other Exercises Other Exercises: PROM Bil LE Other Exercises: P/AAROM Bil UE's   Balance Balance Balance Assessed: Yes Static Sitting Balance Static Sitting - Balance Support: Left upper extremity  supported Static Sitting - Level of Assistance: 1: +1 Total assist Static Sitting - Comment/# of Minutes: sat EOB for >15 min continuing to work on muscle activation, trunk/cervical extension with rotation.  Noted some obvious moments of good R side extension and assist with R UE.  Atempted to elicit LE movement esp with LAQ.   End of Session OT - End of Session Activity Tolerance: Patient limited by fatigue Patient left: in bed;with call bell/phone within reach;with family/visitor present Nurse Communication: Mobility status  GO     Ameenah Prosser, Ursula Alert M 06/15/2013, 6:20 PM

## 2013-06-16 DIAGNOSIS — R1310 Dysphagia, unspecified: Secondary | ICD-10-CM

## 2013-06-16 LAB — GLUCOSE, CAPILLARY
Glucose-Capillary: 98 mg/dL (ref 70–99)
Glucose-Capillary: 140 mg/dL — ABNORMAL HIGH (ref 70–99)

## 2013-06-16 MED ORDER — CHLORHEXIDINE GLUCONATE 0.12 % MT SOLN: 15.0000 mL | Freq: Two times a day (BID) | OROMUCOSAL | Status: DC

## 2013-06-16 MED ORDER — LACOSAMIDE 200 MG PO TABS: 200.0000 mg | ORAL_TABLET | Freq: Two times a day (BID) | ORAL | Status: DC

## 2013-06-16 MED ORDER — BIOTENE DRY MOUTH MT LIQD: 1.0000 "application " | Freq: Four times a day (QID) | OROMUCOSAL | Status: DC

## 2013-06-16 MED ORDER — PHENYTOIN 125 MG/5ML PO SUSP: 150.0000 mg | Freq: Three times a day (TID) | ORAL | Status: DC

## 2013-06-16 MED ORDER — INSULIN ASPART 100 UNIT/ML ~~LOC~~ SOLN: SUBCUTANEOUS | Status: DC

## 2013-06-16 MED ORDER — FLUCONAZOLE 100 MG PO TABS: 100.0000 mg | ORAL_TABLET | Freq: Every day | ORAL | Status: DC

## 2013-06-16 MED ORDER — LEVETIRACETAM 100 MG/ML PO SOLN: 1000.0000 mg | Freq: Two times a day (BID) | ORAL | Status: DC

## 2013-06-16 MED ORDER — FREE WATER: 200.0000 mL | Freq: Four times a day (QID) | Status: DC

## 2013-06-16 MED ORDER — ACETAMINOPHEN 160 MG/5ML PO SOLN: 500.0000 mg | ORAL | Status: DC | PRN

## 2013-06-16 MED ORDER — METOPROLOL TARTRATE 25 MG/10 ML ORAL SUSPENSION: 50.0000 mg | Freq: Two times a day (BID) | ORAL | Status: DC

## 2013-06-16 MED ORDER — GLUCERNA 1.2 CAL PO LIQD: 237.0000 mL | ORAL | Status: DC

## 2013-06-16 MED ORDER — GLUCERNA 1.2 CAL PO LIQD: 474.0000 mL | Freq: Three times a day (TID) | ORAL | Status: DC

## 2013-06-16 MED ORDER — SPIRONOLACTONE 25 MG PO TABS: 25.0000 mg | ORAL_TABLET | Freq: Every day | ORAL | Status: DC

## 2013-06-16 MED ORDER — INSULIN GLARGINE 100 UNIT/ML ~~LOC~~ SOLN: 40.0000 [IU] | Freq: Every day | SUBCUTANEOUS | Status: DC

## 2013-06-16 NOTE — Progress Notes (Signed)
Patient's daughter also was taught and demonstrated medication and TF bolus administration through PEG tube. Daughter was not comfortable helping with tracheostomy care at this time.

## 2013-06-16 NOTE — Progress Notes (Signed)
Pt was not on worklist at 2000.  RT was unaware that Trach patient was on the floor.

## 2013-06-16 NOTE — Progress Notes (Signed)
Pt trial without O2 via trach. Brenda Pope was at 28% O2 with 5L. Turned off O2 and waited approximately 5 minutes. Pt SpO2 desaturated to 87% on room air. Documented in vital signs section of Doc Flowsheets.  Claudette Head, RN, MSN @ [phone] on 06/16/2013

## 2013-06-16 NOTE — Social Work (Signed)
Per Case Manager, the patient's spouse had asked to speak to someone in Social Work leadership in regards to questions he had about his skilled nursing facility options. As a courtesy to the family, I met with Brenda Pope to answer all of his questions. He understood that the only skilled nursing center bed offer we had available to his [facility]. Mr. Brenda Pope clearly declined that option, and accepted that home would be his choice. At the end of this conversation, his wife turned to him and was able to lip communicate that she wanted to 'go home'. The family has made a decision.  [name], LCSW Assistant Director Clinical Social Work

## 2013-06-17 DIAGNOSIS — I6350 Cerebral infarction due to unspecified occlusion or stenosis of unspecified cerebral artery: Principal | ICD-10-CM

## 2013-06-17 LAB — GLUCOSE, CAPILLARY
Glucose-Capillary: 125 mg/dL — ABNORMAL HIGH (ref 70–99)
Glucose-Capillary: 213 mg/dL — ABNORMAL HIGH (ref 70–99)

## 2013-06-17 MED ORDER — METOPROLOL TARTRATE 25 MG/10 ML ORAL SUSPENSION: 50.0000 mg | Freq: Two times a day (BID) | ORAL | Status: DC

## 2013-06-17 MED ORDER — PHENYTOIN 125 MG/5ML PO SUSP: 150.0000 mg | Freq: Three times a day (TID) | ORAL | Status: DC

## 2013-06-17 NOTE — Progress Notes (Signed)
Patient seen and examined by me.  All services set up at home.  Match program scripts given.  Meter and syringes sent to pharmacy.  Discharge done yesterday.    Marlin Canary DO

## 2013-06-19 ENCOUNTER — Emergency Department: Payer: Self-pay | Admitting: Emergency Medicine

## 2013-06-19 LAB — BASIC METABOLIC PANEL
BUN: 27 mg/dL — ABNORMAL HIGH (ref 7–18)
Calcium, Total: 9.1 mg/dL (ref 8.5–10.1)
Co2: 32 mmol/L (ref 21–32)
Creatinine: 0.58 mg/dL — ABNORMAL LOW (ref 0.60–1.30)
EGFR (African American): >60
EGFR (Non-African Amer.): >60
Glucose: 187 mg/dL — ABNORMAL HIGH (ref 65–99)
Osmolality: 280 (ref 275–301)
Potassium: 4.1 mmol/L (ref 3.5–5.1)

## 2013-06-19 LAB — CBC
HCT: 25 % — ABNORMAL LOW (ref 35.0–47.0)
HGB: 8.7 g/dL — ABNORMAL LOW (ref 12.0–16.0)
MCH: 31.7 pg (ref 26.0–34.0)
MCV: 92 fL (ref 80–100)
RBC: 2.73 10*6/uL — ABNORMAL LOW (ref 3.80–5.20)
RDW: 17.5 % — ABNORMAL HIGH (ref 11.5–14.5)
WBC: 6.5 10*3/uL (ref 3.6–11.0)

## 2013-06-19 LAB — TROPONIN I: Troponin-I: <0.02 ng/mL

## 2013-06-20 ENCOUNTER — Telehealth: Payer: Self-pay | Admitting: General Practice

## 2013-06-20 LAB — HEPATIC FUNCTION PANEL A (ARMC)
Albumin: 2.2 g/dL — ABNORMAL LOW (ref 3.4–5.0)
Alkaline Phosphatase: 246 U/L — ABNORMAL HIGH (ref 50–136)
Bilirubin,Total: 0.2 mg/dL (ref 0.2–1.0)
SGOT(AST): 28 U/L (ref 15–37)
SGPT (ALT): 52 U/L (ref 12–78)
Total Protein: 6.3 g/dL — ABNORMAL LOW (ref 6.4–8.2)

## 2013-06-20 LAB — PRO B NATRIURETIC PEPTIDE: B-Type Natriuretic Peptide: 440 pg/mL — ABNORMAL HIGH (ref 0–125)

## 2013-06-20 LAB — URINALYSIS, COMPLETE
Bilirubin,UR: NEGATIVE
Glucose,UR: NEGATIVE mg/dL (ref 0–75)
Hyaline Cast: 2
RBC,UR: 3 /HPF (ref 0–5)
Squamous Epithelial: 2

## 2013-06-20 LAB — PHENYTOIN LEVEL, TOTAL: Dilantin: 16.2 ug/mL (ref 10.0–20.0)

## 2013-06-20 NOTE — Telephone Encounter (Signed)
She is not our patient

## 2013-06-20 NOTE — Telephone Encounter (Signed)
Noted  

## 2013-06-20 NOTE — Telephone Encounter (Signed)
FYI. Home Health Nurse called and stated that pt had been in the ER last night due to vomiting and husband was concerned with her Trach. Her Occupational Therapy appt will be completed between 8/26 and 9/2.

## 2013-06-24 ENCOUNTER — Inpatient Hospital Stay: Payer: Self-pay | Admitting: Internal Medicine

## 2013-06-24 LAB — URINALYSIS, COMPLETE
Bilirubin,UR: NEGATIVE
Blood: NEGATIVE
Ketone: NEGATIVE
Nitrite: NEGATIVE
RBC,UR: 2 /HPF (ref 0–5)
Squamous Epithelial: 2
WBC UR: 17 /HPF (ref 0–5)

## 2013-06-24 LAB — CBC
HGB: 8.4 g/dL — ABNORMAL LOW (ref 12.0–16.0)
MCH: 32 pg (ref 26.0–34.0)
RBC: 2.63 10*6/uL — ABNORMAL LOW (ref 3.80–5.20)
RDW: 17.3 % — ABNORMAL HIGH (ref 11.5–14.5)
WBC: 5.3 10*3/uL (ref 3.6–11.0)

## 2013-06-24 LAB — COMPREHENSIVE METABOLIC PANEL
Alkaline Phosphatase: 204 U/L — ABNORMAL HIGH (ref 50–136)
Anion Gap: 2 — ABNORMAL LOW (ref 7–16)
Co2: 33 mmol/L — ABNORMAL HIGH (ref 21–32)
Creatinine: 0.48 mg/dL — ABNORMAL LOW (ref 0.60–1.30)
EGFR (African American): >60
Potassium: 5 mmol/L (ref 3.5–5.1)
SGOT(AST): 45 U/L — ABNORMAL HIGH (ref 15–37)
SGPT (ALT): 57 U/L (ref 12–78)
Sodium: 133 mmol/L — ABNORMAL LOW (ref 136–145)
Total Protein: 6.1 g/dL — ABNORMAL LOW (ref 6.4–8.2)

## 2013-06-24 LAB — TROPONIN I: Troponin-I: <0.02 ng/mL

## 2013-06-24 LAB — CK TOTAL AND CKMB (NOT AT ARMC): CK, Total: 68 U/L (ref 21–215)

## 2013-06-25 LAB — CBC WITH DIFFERENTIAL/PLATELET
Eosinophil %: 2.1 %
HCT: 23.2 % — ABNORMAL LOW (ref 35.0–47.0)
HGB: 8.1 g/dL — ABNORMAL LOW (ref 12.0–16.0)
Lymphocyte %: 9.4 %
MCHC: 34.9 g/dL (ref 32.0–36.0)
MCV: 90 fL (ref 80–100)
Monocyte #: 0.4 x10 3/mm (ref 0.2–0.9)
Monocyte %: 8.9 %
Neutrophil %: 79 %
RBC: 2.57 10*6/uL — ABNORMAL LOW (ref 3.80–5.20)
WBC: 4.3 10*3/uL (ref 3.6–11.0)

## 2013-06-25 LAB — COMPREHENSIVE METABOLIC PANEL
Albumin: 2.1 g/dL — ABNORMAL LOW (ref 3.4–5.0)
Anion Gap: 4 — ABNORMAL LOW (ref 7–16)
Bilirubin,Total: 0.2 mg/dL (ref 0.2–1.0)
Calcium, Total: 8.8 mg/dL (ref 8.5–10.1)
Chloride: 99 mmol/L (ref 98–107)
Creatinine: 0.65 mg/dL (ref 0.60–1.30)
EGFR (African American): >60
EGFR (Non-African Amer.): >60
Glucose: 157 mg/dL — ABNORMAL HIGH (ref 65–99)
Osmolality: 276 (ref 275–301)
SGOT(AST): 24 U/L (ref 15–37)
SGPT (ALT): 48 U/L (ref 12–78)
Sodium: 136 mmol/L (ref 136–145)
Total Protein: 5.7 g/dL — ABNORMAL LOW (ref 6.4–8.2)

## 2013-06-26 LAB — CBC WITH DIFFERENTIAL/PLATELET
Eosinophil #: 0.1 10*3/uL (ref 0.0–0.7)
Eosinophil %: 1.4 %
HCT: 22.7 % — ABNORMAL LOW (ref 35.0–47.0)
HGB: 8.1 g/dL — ABNORMAL LOW (ref 12.0–16.0)
Lymphocyte %: 9.8 %
MCH: 32.1 pg (ref 26.0–34.0)
MCV: 90 fL (ref 80–100)
Monocyte #: 0.5 x10 3/mm (ref 0.2–0.9)
Monocyte %: 8.7 %
Neutrophil #: 4.3 10*3/uL (ref 1.4–6.5)
RDW: 17 % — ABNORMAL HIGH (ref 11.5–14.5)
WBC: 5.5 10*3/uL (ref 3.6–11.0)

## 2013-06-27 LAB — CBC WITH DIFFERENTIAL/PLATELET
Eosinophil #: 0.1 10*3/uL (ref 0.0–0.7)
HGB: 8.7 g/dL — ABNORMAL LOW (ref 12.0–16.0)
Lymphocyte #: 0.5 10*3/uL — ABNORMAL LOW (ref 1.0–3.6)
Lymphocyte %: 7.8 %
MCHC: 35 g/dL (ref 32.0–36.0)
MCV: 91 fL (ref 80–100)
Neutrophil #: 5.4 10*3/uL (ref 1.4–6.5)
Neutrophil %: 82 %
Platelet: 296 10*3/uL (ref 150–440)
RBC: 2.74 10*6/uL — ABNORMAL LOW (ref 3.80–5.20)
RDW: 17 % — ABNORMAL HIGH (ref 11.5–14.5)

## 2013-06-27 LAB — BASIC METABOLIC PANEL
Calcium, Total: 9 mg/dL (ref 8.5–10.1)
Chloride: 102 mmol/L (ref 98–107)
Creatinine: 0.67 mg/dL (ref 0.60–1.30)
EGFR (African American): >60
Glucose: 205 mg/dL — ABNORMAL HIGH (ref 65–99)
Potassium: 4.1 mmol/L (ref 3.5–5.1)
Sodium: 135 mmol/L — ABNORMAL LOW (ref 136–145)

## 2013-06-27 DEATH — deceased

## 2013-06-28 LAB — EXPECTORATED SPUTUM ASSESSMENT W GRAM STAIN, RFLX TO RESP C

## 2013-06-29 LAB — CULTURE, BLOOD (SINGLE)

## 2013-07-01 LAB — CULTURE, BLOOD (SINGLE)

## 2013-07-04 ENCOUNTER — Inpatient Hospital Stay: Payer: Self-pay | Admitting: Specialist

## 2013-07-04 LAB — LIPASE, BLOOD: Lipase: 117 U/L (ref 73–393)

## 2013-07-04 LAB — CBC WITH DIFFERENTIAL/PLATELET
Basophil #: 0.1 10*3/uL (ref 0.0–0.1)
Basophil %: 0.7 %
Eosinophil %: 0.6 %
HCT: 25 % — ABNORMAL LOW (ref 35.0–47.0)
Monocyte #: 0.4 x10 3/mm (ref 0.2–0.9)
Monocyte %: 5.2 %
Neutrophil #: 6.9 10*3/uL — ABNORMAL HIGH (ref 1.4–6.5)
WBC: 7.9 10*3/uL (ref 3.6–11.0)

## 2013-07-04 LAB — COMPREHENSIVE METABOLIC PANEL
BUN: 16 mg/dL (ref 7–18)
Bilirubin,Total: 0.3 mg/dL (ref 0.2–1.0)
Calcium, Total: 9.2 mg/dL (ref 8.5–10.1)
Co2: 33 mmol/L — ABNORMAL HIGH (ref 21–32)
EGFR (Non-African Amer.): >60
Potassium: 4 mmol/L (ref 3.5–5.1)
SGPT (ALT): 89 U/L — ABNORMAL HIGH (ref 12–78)
Sodium: 134 mmol/L — ABNORMAL LOW (ref 136–145)
Total Protein: 6.3 g/dL — ABNORMAL LOW (ref 6.4–8.2)

## 2013-07-04 LAB — URINALYSIS, COMPLETE
Glucose,UR: 50 mg/dL (ref 0–75)
Hyaline Cast: 9
Ketone: NEGATIVE
Nitrite: NEGATIVE
Ph: 5 (ref 4.5–8.0)
RBC,UR: 1 /HPF (ref 0–5)
Specific Gravity: 1.013 (ref 1.003–1.030)
WBC UR: 5 /HPF (ref 0–5)

## 2013-07-04 LAB — CK TOTAL AND CKMB (NOT AT ARMC)
CK, Total: 58 U/L (ref 21–215)
CK-MB: 1.2 ng/mL (ref 0.5–3.6)

## 2013-07-04 LAB — TROPONIN I: Troponin-I: <0.02 ng/mL

## 2013-07-05 LAB — BASIC METABOLIC PANEL
Anion Gap: 5 — ABNORMAL LOW (ref 7–16)
BUN: 16 mg/dL (ref 7–18)
Calcium, Total: 8.9 mg/dL (ref 8.5–10.1)
Creatinine: 0.68 mg/dL (ref 0.60–1.30)
EGFR (African American): >60
Glucose: 165 mg/dL — ABNORMAL HIGH (ref 65–99)
Sodium: 135 mmol/L — ABNORMAL LOW (ref 136–145)

## 2013-07-05 LAB — CBC WITH DIFFERENTIAL/PLATELET
Basophil %: 0.7 %
Eosinophil %: 1.1 %
MCV: 91 fL (ref 80–100)
Platelet: 284 10*3/uL (ref 150–440)
RDW: 16.1 % — ABNORMAL HIGH (ref 11.5–14.5)
WBC: 6.3 10*3/uL (ref 3.6–11.0)

## 2013-07-05 LAB — PHENYTOIN LEVEL, TOTAL: Dilantin: 18.5 ug/mL (ref 10.0–20.0)

## 2013-07-05 LAB — HEPATIC FUNCTION PANEL A (ARMC)
Alkaline Phosphatase: 164 U/L — ABNORMAL HIGH (ref 50–136)
Bilirubin, Direct: <0.1 mg/dL (ref 0.00–0.20)

## 2013-07-06 LAB — PHENYTOIN LEVEL, TOTAL: Dilantin: 18.8 ug/mL (ref 10.0–20.0)

## 2013-07-07 LAB — SODIUM: Sodium: 137 mmol/L (ref 136–145)

## 2013-07-07 LAB — HEMOGLOBIN: HGB: 7.7 g/dL — ABNORMAL LOW (ref 12.0–16.0)

## 2013-07-08 ENCOUNTER — Institutional Professional Consult (permissible substitution): Payer: Self-pay | Admitting: Pulmonary Disease

## 2013-07-08 LAB — ALBUMIN: Albumin: 2 g/dL — ABNORMAL LOW (ref 3.4–5.0)

## 2013-07-09 LAB — CBC WITH DIFFERENTIAL/PLATELET
Basophil #: 0 10*3/uL (ref 0.0–0.1)
Basophil %: 0.4 %
Eosinophil #: 0.2 10*3/uL (ref 0.0–0.7)
HCT: 23.1 % — ABNORMAL LOW (ref 35.0–47.0)
HGB: 8.2 g/dL — ABNORMAL LOW (ref 12.0–16.0)
Lymphocyte #: 0.6 10*3/uL — ABNORMAL LOW (ref 1.0–3.6)
Lymphocyte %: 11.5 %
MCHC: 35.4 g/dL (ref 32.0–36.0)
MCV: 91 fL (ref 80–100)
Monocyte #: 0.5 x10 3/mm (ref 0.2–0.9)
Monocyte %: 8.4 %
Neutrophil #: 4.3 10*3/uL (ref 1.4–6.5)
RDW: 15 % — ABNORMAL HIGH (ref 11.5–14.5)

## 2013-07-09 LAB — CULTURE, BLOOD (SINGLE)

## 2013-07-09 LAB — CREATININE, SERUM: Creatinine: 0.53 mg/dL — ABNORMAL LOW (ref 0.60–1.30)

## 2013-07-12 LAB — PHENYTOIN LEVEL, TOTAL: Dilantin: 9.6 ug/mL — ABNORMAL LOW (ref 10.0–20.0)

## 2013-08-09 ENCOUNTER — Ambulatory Visit: Payer: Self-pay | Admitting: Family Medicine

## 2013-08-11 ENCOUNTER — Encounter (INDEPENDENT_AMBULATORY_CARE_PROVIDER_SITE_OTHER): Payer: Self-pay

## 2013-08-11 ENCOUNTER — Ambulatory Visit (INDEPENDENT_AMBULATORY_CARE_PROVIDER_SITE_OTHER): Payer: Medicaid Other | Admitting: Pulmonary Disease

## 2013-08-11 ENCOUNTER — Encounter: Payer: Self-pay | Admitting: Pulmonary Disease

## 2013-08-11 VITALS — BP 122/60 | HR 63 | Temp 97.2°F

## 2013-08-11 DIAGNOSIS — J9600 Acute respiratory failure, unspecified whether with hypoxia or hypercapnia: Secondary | ICD-10-CM

## 2013-08-11 NOTE — Assessment & Plan Note (Signed)
She seems to have made great strides since the last time I saw her in the intensive care unit Tracheostomy can be downsized to #4 and capped During the daytime. If she tolerates capping for 48 hours, we can consider decannulation. I am not sure if this can be done in the facility that she lives in currently. Ideally, I would like to see her progress more with rehabilitation before decannulation. Albuterol nebs can be stopped Since her oxygen saturation is good on room air. Oxygen can also be discontinued I have tried to communicate these recommendations to Dr. Ananias Pilgrim at [phone]

## 2013-08-11 NOTE — Progress Notes (Signed)
Subjective:    Patient ID: Brenda Pope, female    DOB: 1956-01-02, 57 y.o.   MRN: [id number]  HPI  57 y/o with past medical history of CVAs/TIAs and diastolic heart failure, severe baseline hypertension with noncompliance, poorly controlled diabetes who was admitted to [facility] on 7/5 with acute dyspnea and suspected CHF exacerbation. On the day of admission she developed neurological findings consistent with acute CVA. On 7/6 she required intubated for airway protection during suspected seizure activity. She became hypotensive post intubation requiring vasopressors. MRI confirmed small acute infarcts of the right parietal lobe. On 7/7 she was transferred to [facility] for status epilepticus requiring pentobarb coma & prolonged mech ventilation  SIGNIFICANT EVENTS / STUDIES:  7/06 - Brain MRI: Multiple small acute infarcts R parietal lobe, possible small infarcts left basal ganglia and central pons  7/08 - MRI brain: Confluent and scattered right MCA and right PCA acute infarcts, plus scattered small left hemisphere and occasional posterior fossa acute infarcts. The vast majority of these infarcts are newly seen since 05/01/2013. This appearance suggests embolic phenomena which severely affected more proximal/medium-sized vessels of the right MCA and PCA territories. Cytotoxic edema without significant mass effect at this time. No associated hemorrhage  7/10 - Consideration of primary CNS vasculitis. Serology>> CRP, ESR markedly elevated. Treated with High dose methylpred  7/11 - angiogram eval for vasculitis: smooth focal areas of narrowing of prox PCAs and distal pericallosal. Subcortical branches, suspicious of vasculitis.  7/14 - TEE: no evidence of thrombus  7/20 - MRI brain: Overall mildly regressed signal abnormality in the brain associated with multi focal infarcts. The most confluent infarcts in the right posterior MCA and PCA territories do demonstrate persistent restricted diffusion, but  less cytotoxic edema. There is trace petechial hemorrhage, but no malignant hemorrhagic transformation or associated mass effect. Slight extension of a central pontine lacunar infarct (pontine perforator artery territory) without mass effect or hemorrhage   7/30 - Trach completed by Dr. Molli Knock   8/01 - G tube placed by IR      She has a 6 cuffless tracheostomy and tolerates a PM valve daytime She is able to stand with a walker and is progressing with rehabilitation at the [facility] She has graduated to eating and is supplemented by PEG tube feeds   Past Medical History  Diagnosis Date  . Stroke   . Diabetes mellitus without complication   . Hypertension   . CHF (congestive heart failure)   . Splenomegaly      Review of Systems neg for any significant sore throat, dysphagia, itching, sneezing, nasal congestion or excess/ purulent secretions, fever, chills, sweats, unintended wt loss, pleuritic or exertional cp, hempoptysis, orthopnea pnd or change in chronic leg swelling. Also denies presyncope, palpitations, heartburn, abdominal pain, nausea, vomiting, diarrhea or change in bowel or urinary habits, dysuria,hematuria, rash, arthralgias, visual complaints, headache, numbness weakness or ataxia.     Objective:   Physical Exam  Gen. Pleasant, obese, in no distress, normal affect, in wheelchair ENT - no lesions, no post nasal drip, class 2 airway Neck: No JVD, no thyromegaly, no carotid bruits Lungs: no use of accessory muscles, no dullness to percussion, decreased without rales or rhonchi  Cardiovascular: Rhythm regular, heart sounds  normal, no murmurs or gallops, no peripheral edema Abdomen: soft and non-tender, no hepatosplenomegaly, BS normal. Musculoskeletal: No deformities, no cyanosis or clubbing Neuro:  alert, non focal, no tremors       Assessment & Plan:

## 2013-09-09 ENCOUNTER — Telehealth: Payer: Self-pay | Admitting: Pulmonary Disease

## 2013-09-09 ENCOUNTER — Emergency Department: Payer: Self-pay | Admitting: Emergency Medicine

## 2013-09-09 LAB — CBC
HGB: 8.6 g/dL — ABNORMAL LOW (ref 12.0–16.0)
MCV: 89 fL (ref 80–100)
Platelet: 304 10*3/uL (ref 150–440)
RBC: 2.74 10*6/uL — ABNORMAL LOW (ref 3.80–5.20)
RDW: 12.8 % (ref 11.5–14.5)
WBC: 6.8 10*3/uL (ref 3.6–11.0)

## 2013-09-09 LAB — COMPREHENSIVE METABOLIC PANEL
Albumin: 3 g/dL — ABNORMAL LOW (ref 3.4–5.0)
Anion Gap: 4 — ABNORMAL LOW (ref 7–16)
Calcium, Total: 9.5 mg/dL (ref 8.5–10.1)
Glucose: 242 mg/dL — ABNORMAL HIGH (ref 65–99)
Potassium: 3.2 mmol/L — ABNORMAL LOW (ref 3.5–5.1)
SGPT (ALT): 42 U/L (ref 12–78)
Total Protein: 6.5 g/dL (ref 6.4–8.2)

## 2013-09-09 NOTE — Telephone Encounter (Signed)
Who changed her tracheostomy? Not much can be done over the phone for this.  If she is having difficulty with her breathing, then she would likely need to come to office for evaluation or go to ER.

## 2013-09-09 NOTE — Telephone Encounter (Signed)
I spoke with pt husband. He reports pt trach was switched from a size 6 to a 4. When he cleans her trach out and it has brown phlem inside of it in the AM's. Pt did get this color out with the size 6 but now she is downsized it's more brown phlem. He suctions pt trach every 3-4 hrs and it's clear phlem. Denies any increased SOB, no wheezing, no chest tx. She does not take mucinex. She still takes spiriva daily. Spouse is wanting to know if this is normal. Since RA is not on the schedule please advise Dr. Craige Cotta thanks

## 2013-09-09 NOTE — Telephone Encounter (Signed)
I spoke with pt spouse. He reports [facility] changed her trach. She is not having any symptoms at this time. He will assess pt over the weekend and if need be he will take her to the ED. Nothing further needed

## 2013-09-24 LAB — COMPREHENSIVE METABOLIC PANEL
Albumin: 2.9 g/dL — ABNORMAL LOW (ref 3.4–5.0)
Alkaline Phosphatase: 143 U/L — ABNORMAL HIGH
BUN: 26 mg/dL — ABNORMAL HIGH (ref 7–18)
Bilirubin,Total: 0.2 mg/dL (ref 0.2–1.0)
Creatinine: 0.94 mg/dL (ref 0.60–1.30)
EGFR (African American): >60
EGFR (Non-African Amer.): >60
Glucose: 219 mg/dL — ABNORMAL HIGH (ref 65–99)
Osmolality: 285 (ref 275–301)
Potassium: 3.7 mmol/L (ref 3.5–5.1)
SGOT(AST): 29 U/L (ref 15–37)
SGPT (ALT): 32 U/L (ref 12–78)
Total Protein: 6.3 g/dL — ABNORMAL LOW (ref 6.4–8.2)

## 2013-09-24 LAB — CBC
HCT: 21.9 % — ABNORMAL LOW (ref 35.0–47.0)
HGB: 7.8 g/dL — ABNORMAL LOW (ref 12.0–16.0)
MCH: 31.8 pg (ref 26.0–34.0)
MCHC: 35.5 g/dL (ref 32.0–36.0)
RBC: 2.44 10*6/uL — ABNORMAL LOW (ref 3.80–5.20)
RDW: 12.5 % (ref 11.5–14.5)
WBC: 6.2 10*3/uL (ref 3.6–11.0)

## 2013-09-25 ENCOUNTER — Observation Stay: Payer: Self-pay | Admitting: Internal Medicine

## 2013-09-25 LAB — CK TOTAL AND CKMB (NOT AT ARMC)
CK, Total: 24 U/L (ref 21–215)
CK, Total: 48 U/L (ref 21–215)
CK, Total: 41 U/L (ref 21–215)
CK-MB: 1 ng/mL (ref 0.5–3.6)
CK-MB: 2 ng/mL (ref 0.5–3.6)
CK-MB: 2.2 ng/mL (ref 0.5–3.6)

## 2013-09-25 LAB — IRON AND TIBC
Iron Saturation: 60 %
Unbound Iron-Bind.Cap.: 77 ug/dL

## 2013-09-25 LAB — URINALYSIS, COMPLETE
Blood: NEGATIVE
Ketone: NEGATIVE
Nitrite: NEGATIVE
Protein: NEGATIVE
RBC,UR: 1 /HPF (ref 0–5)
Squamous Epithelial: 1

## 2013-09-26 ENCOUNTER — Emergency Department: Payer: Self-pay | Admitting: Emergency Medicine

## 2013-09-30 LAB — EXPECTORATED SPUTUM ASSESSMENT W GRAM STAIN, RFLX TO RESP C

## 2013-11-02 ENCOUNTER — Telehealth: Payer: Self-pay | Admitting: Pulmonary Disease

## 2013-11-02 NOTE — Telephone Encounter (Signed)
Sure, They can see dr Kendrick Friesmcquaid in [place] or an ENt doc can handle trach care

## 2013-11-02 NOTE — Telephone Encounter (Signed)
I spoke with pt spouse. He reports any time pt goes any further than 10-15 miles she starts getting car sick and vomits. He reports they love Dr. Vassie LollAlva but can't handle the long drive. He is asking to be referred to a closer place to them in [place] and that handles trach needs. Please advised Dr. Vassie LollAlva thanks

## 2013-11-03 NOTE — Telephone Encounter (Signed)
I spoke with pt spouse. He is scheduled to see B[facility] in [place]. Nothing further needed

## 2013-11-18 ENCOUNTER — Telehealth: Payer: Self-pay | Admitting: Pulmonary Disease

## 2013-11-18 NOTE — Telephone Encounter (Signed)
Spoke with Bonita QuinLinda and advised that BQ does not remove feeding tubes Nothing further needed

## 2013-11-25 ENCOUNTER — Institutional Professional Consult (permissible substitution): Payer: Self-pay | Admitting: Pulmonary Disease

## 2013-11-30 ENCOUNTER — Encounter (INDEPENDENT_AMBULATORY_CARE_PROVIDER_SITE_OTHER): Payer: Self-pay

## 2013-11-30 ENCOUNTER — Ambulatory Visit (INDEPENDENT_AMBULATORY_CARE_PROVIDER_SITE_OTHER): Payer: Self-pay | Admitting: Pulmonary Disease

## 2013-11-30 VITALS — BP 92/54 | HR 57 | Ht 72.0 in | Wt 198.0 lb

## 2013-11-30 DIAGNOSIS — Z930 Tracheostomy status: Secondary | ICD-10-CM

## 2013-11-30 NOTE — Assessment & Plan Note (Addendum)
Brenda OddiBetsy has been doing well without difficulty. She has no respiratory problems at this time. According to her husband she handles her secretions well. He has continued to suction her throughout the day. I've asked him to stop this while we her trach for the next 24 hours. If she does well then she will come back tomorrow and we will decannulate her around this time.

## 2013-11-30 NOTE — Progress Notes (Signed)
Subjective:    Patient ID: Brenda Pope, female    DOB: Nov 02, 1955, 58 y.o.   MRN: [id number]  HPI  Synopsis:  58 y/o with past medical history of CVAs/TIAs and diastolic heart failure, severe baseline hypertension with noncompliance, poorly controlled diabetes who was admitted to [facility] on 04/30/2013 with acute dyspnea and suspected CHF exacerbation. On the day of admission she developed neurological findings consistent with acute CVA. On 05/01/2013 she required intubated for airway protection during suspected seizure activity. She became hypotensive post intubation requiring vasopressors. MRI confirmed small acute infarcts of the right parietal lobe. On 05/02/2013 she was transferred to [facility] for status epilepticus requiring pentobarb coma & prolonged mech ventilation   11/30/2013 ROV > Brenda Pope has been doing very well since the last visit. Unfortunately she never had her trach decannulated. She has been capping the trach throughout the course of the day for 12-14 hours at a time and she does well with this. Her husband continues to suction her every morning and just gets a little bit of sputum out. He states that she is capable of handling her secretions without difficulty throughout the course of the day. She uses a Passy-Muir valve most of the time. Her tracheostomy tube actually came out this morning by accident when she was coughing and she did well. He is anxious to have the tube removed.  SIGNIFICANT EVENTS / STUDIES:  7/06 - Brain MRI: Multiple small acute infarcts R parietal lobe, possible small infarcts left basal ganglia and central pons  7/08 - MRI brain: Confluent and scattered right MCA and right PCA acute infarcts, plus scattered small left hemisphere and occasional posterior fossa acute infarcts. The vast majority of these infarcts are newly seen since 05/01/2013. This appearance suggests embolic phenomena which severely affected more proximal/medium-sized vessels of the right MCA and  PCA territories. Cytotoxic edema without significant mass effect at this time. No associated hemorrhage  7/10 - Consideration of primary CNS vasculitis. Serology>> CRP, ESR markedly elevated. Treated with High dose methylpred  7/11 - angiogram eval for vasculitis: smooth focal areas of narrowing of prox PCAs and distal pericallosal. Subcortical branches, suspicious of vasculitis.  7/14 - TEE: no evidence of thrombus  7/20 - MRI brain: Overall mildly regressed signal abnormality in the brain associated with multi focal infarcts. The most confluent infarcts in the right posterior MCA and PCA territories do demonstrate persistent restricted diffusion, but less cytotoxic edema. There is trace petechial hemorrhage, but no malignant hemorrhagic transformation or associated mass effect. Slight extension of a central pontine lacunar infarct (pontine perforator artery territory) without mass effect or hemorrhage  7/30 - Trach completed by Dr. Nelda Marseille  8/01 - G tube placed by IR   Past Medical History  Diagnosis Date  . Stroke   . Diabetes mellitus without complication   . Hypertension   . CHF (congestive heart failure)   . Splenomegaly       Review of Systems  Constitutional: Positive for appetite change and unexpected weight change. Negative for fever.  HENT: Negative for congestion, dental problem, ear pain, nosebleeds, postnasal drip, rhinorrhea, sinus pressure, sneezing, sore throat and trouble swallowing.   Eyes: Negative for redness and itching.  Respiratory: Negative for cough, chest tightness, shortness of breath and wheezing.   Cardiovascular: Negative for palpitations and leg swelling.  Gastrointestinal: Negative for nausea and vomiting.  Genitourinary: Negative for dysuria.  Musculoskeletal: Negative for joint swelling.  Skin: Negative for rash.  Neurological: Negative for headaches.  Hematological:  Does not bruise/bleed easily.  Psychiatric/Behavioral: Positive for dysphoric mood.  The patient is not nervous/anxious.        Objective:   Physical Exam  Filed Vitals:   11/30/13 1237  BP: 92/54  Pulse: 57  Height: 6' (1.829 m)  Weight: 198 lb (89.812 kg)  SpO2: 97%   Gen: somnolent HEENT: NCAT, Trache site c/d/i PULM: CTA B CV: RRR, no mgr Ab: BS+, soft, nontender Ext: warm, no edema      Assessment & Plan:   Tracheostomy status [name] has been doing well without difficulty. She has no respiratory problems at this time. According to her husband she handles her secretions well. He has continued to suction her throughout the day. I've asked him to stop this while we her trach for the next 24 hours. If she does well then she will come back tomorrow and we will decannulate her around this time.    Updated Medication List Outpatient Encounter Prescriptions as of 11/30/2013  Medication Sig  . [DISCONTINUED] acetaminophen (TYLENOL) 160 MG/5ML solution Place 15.6 mLs (500 mg total) into feeding tube every 4 (four) hours as needed for fever.  . [DISCONTINUED] amLODipine (NORVASC) 10 MG tablet Take 10 mg by mouth daily. Via tube  . [DISCONTINUED] aspirin 325 MG tablet Place 1 tablet (325 mg total) into feeding tube daily.  . [DISCONTINUED] hydrALAZINE (APRESOLINE) 25 MG tablet 1 tab via tube 3 times daily  . [DISCONTINUED] metoCLOPramide (REGLAN) 5 MG/5ML solution 10 ML via tube 3 times daily  . [DISCONTINUED] acetaminophen (TYLENOL) 325 MG tablet Take 650 mg by mouth every 4 (four) hours as needed for pain.  . [DISCONTINUED] cholestyramine (QUESTRAN) 4 GM/DOSE powder 4 gm mixed in 4 oz of liquid PO qid prn  . [DISCONTINUED] dipyridamole (PERSANTINE) 25 MG tablet 1 capsule via tube every 6 hrs as needed  . [DISCONTINUED] feeding supplement (PRO-STAT SUGAR FREE 64) LIQD Place 30 mL into feeding tube daily.  . [DISCONTINUED] ferrous sulfate 220 (44 FE) MG/5ML solution 5 ML via tube twice daily  . [DISCONTINUED] guaifenesin (ROBITUSSIN) 100 MG/5ML syrup 20 mls via tube  4 times daily as needed  . [DISCONTINUED] hydrochlorothiazide (HYDRODIURIL) 25 MG tablet Take 25 mg by mouth daily. Via tube  . [DISCONTINUED] insulin aspart (NOVOLOG) 100 UNIT/ML injection CBG 70 - 120: 0 units CBG 121 - 150: 3 units CBG 151 - 200: 4 units CBG 201 - 250: 7 units CBG 251 - 300: 11 units CBG 301 - 350: 15 units CBG 351 - 400: 20 units  . [DISCONTINUED] ipratropium-albuterol (DUONEB) 0.5-2.5 (3) MG/3ML SOLN Take 3 mLs by nebulization 4 (four) times daily as needed.  . [DISCONTINUED] irbesartan (AVAPRO) 300 MG tablet Place 300 mg into feeding tube 2 (two) times daily.  . [DISCONTINUED] lacosamide (VIMPAT) 200 MG TABS tablet Place 1 tablet (200 mg total) into feeding tube 2 (two) times daily.  . [DISCONTINUED] levETIRAcetam (KEPPRA) 100 MG/ML solution Place 10 mLs (1,000 mg total) into feeding tube 2 (two) times daily.  . [DISCONTINUED] metoprolol tartrate (LOPRESSOR) 25 mg/10 mL SUSP Place 20 mLs (50 mg total) into feeding tube 2 (two) times daily.  . [DISCONTINUED] Nutritional Supplements (FEEDING SUPPLEMENT, GLUCERNA 1.2 CAL,) LIQD Place 237 mLs into feeding tube daily.  . [DISCONTINUED] Nutritional Supplements (FEEDING SUPPLEMENT, GLUCERNA 1.2 CAL,) LIQD Place 474 mLs into feeding tube 3 (three) times daily.  . [DISCONTINUED] OMEPRAZOLE PO 10 ml via tube daily  . [DISCONTINUED] phenytoin (DILANTIN) 125 MG/5ML suspension 5 ML VIA TUBE  3 TIMES DAILY  . [DISCONTINUED] sucralfate (CARAFATE) 1 GM/10ML suspension Take 1 g by mouth 4 (four) times daily. VIA TUBE  . [DISCONTINUED] traMADol (ULTRAM) 50 MG tablet Take 50 mg by mouth every 6 (six) hours as needed for pain. Via tube  . [DISCONTINUED] traZODone (DESYREL) 50 MG tablet Take 25 mg by mouth at bedtime. Via tube  . [DISCONTINUED] Water For Irrigation, Sterile (FREE WATER) SOLN Place 200 mLs into feeding tube every 6 (six) hours.

## 2013-11-30 NOTE — Patient Instructions (Signed)
Cap the trach for 24 hours We will see you tomorrow afternoon to remove the trach

## 2013-12-01 ENCOUNTER — Encounter: Payer: Self-pay | Admitting: Pulmonary Disease

## 2013-12-01 NOTE — Progress Notes (Signed)
   Subjective:    Patient ID: Brenda Pope, female    DOB: 04/16/1956, 58 y.o.   MRN: [id number]  HPI    Review of Systems     Objective:   Physical Exam        Assessment & Plan:

## 2014-01-13 ENCOUNTER — Emergency Department: Payer: Self-pay | Admitting: Emergency Medicine

## 2014-01-13 LAB — CBC
HCT: 25 % — ABNORMAL LOW (ref 35.0–47.0)
HGB: 8.4 g/dL — ABNORMAL LOW (ref 12.0–16.0)
MCH: 29.2 pg (ref 26.0–34.0)
MCHC: 33.5 g/dL (ref 32.0–36.0)
MCV: 87 fL (ref 80–100)
PLATELETS: 300 10*3/uL (ref 150–440)
RBC: 2.87 10*6/uL — ABNORMAL LOW (ref 3.80–5.20)
RDW: 14.2 % (ref 11.5–14.5)
WBC: 6.6 10*3/uL (ref 3.6–11.0)

## 2014-01-13 LAB — URINALYSIS, COMPLETE
BILIRUBIN, UR: NEGATIVE
Blood: NEGATIVE
GLUCOSE, UR: NEGATIVE mg/dL (ref 0–75)
KETONE: NEGATIVE
Leukocyte Esterase: NEGATIVE
Nitrite: NEGATIVE
Ph: 5 (ref 4.5–8.0)
Protein: NEGATIVE
RBC,UR: 1 /HPF (ref 0–5)
Specific Gravity: 1.017 (ref 1.003–1.030)
Squamous Epithelial: 2
WBC UR: 2 /HPF (ref 0–5)

## 2014-01-13 LAB — BASIC METABOLIC PANEL
ANION GAP: 6 — AB (ref 7–16)
BUN: 12 mg/dL (ref 7–18)
CALCIUM: 8.1 mg/dL — AB (ref 8.5–10.1)
CREATININE: 0.84 mg/dL (ref 0.60–1.30)
Chloride: 101 mmol/L (ref 98–107)
Co2: 30 mmol/L (ref 21–32)
EGFR (African American): >60
EGFR (Non-African Amer.): >60
Glucose: 131 mg/dL — ABNORMAL HIGH (ref 65–99)
Osmolality: 275 (ref 275–301)
POTASSIUM: 2.9 mmol/L — AB (ref 3.5–5.1)
SODIUM: 137 mmol/L (ref 136–145)

## 2014-01-24 ENCOUNTER — Inpatient Hospital Stay: Payer: Self-pay | Admitting: Family Medicine

## 2014-01-24 LAB — CBC WITH DIFFERENTIAL/PLATELET
Basophil #: 0.1 10*3/uL (ref 0.0–0.1)
Basophil %: 0.5 %
EOS ABS: 0.1 10*3/uL (ref 0.0–0.7)
Eosinophil %: 0.4 %
HCT: 23.2 % — ABNORMAL LOW (ref 35.0–47.0)
HGB: 7.8 g/dL — AB (ref 12.0–16.0)
LYMPHS PCT: 4.3 %
Lymphocyte #: 0.6 10*3/uL — ABNORMAL LOW (ref 1.0–3.6)
MCH: 29.5 pg (ref 26.0–34.0)
MCHC: 33.7 g/dL (ref 32.0–36.0)
MCV: 87 fL (ref 80–100)
MONO ABS: 0.5 x10 3/mm (ref 0.2–0.9)
Monocyte %: 3.8 %
NEUTROS PCT: 91 %
Neutrophil #: 13 10*3/uL — ABNORMAL HIGH (ref 1.4–6.5)
PLATELETS: 371 10*3/uL (ref 150–440)
RBC: 2.65 10*6/uL — AB (ref 3.80–5.20)
RDW: 14.2 % (ref 11.5–14.5)
WBC: 14.3 10*3/uL — ABNORMAL HIGH (ref 3.6–11.0)

## 2014-01-24 LAB — COMPREHENSIVE METABOLIC PANEL
ALBUMIN: 1.7 g/dL — AB (ref 3.4–5.0)
Alkaline Phosphatase: 225 U/L — ABNORMAL HIGH
Anion Gap: 6 — ABNORMAL LOW (ref 7–16)
BUN: 34 mg/dL — ABNORMAL HIGH (ref 7–18)
Bilirubin,Total: 0.2 mg/dL (ref 0.2–1.0)
CREATININE: 1.6 mg/dL — AB (ref 0.60–1.30)
Calcium, Total: 8.3 mg/dL — ABNORMAL LOW (ref 8.5–10.1)
Chloride: 99 mmol/L (ref 98–107)
Co2: 29 mmol/L (ref 21–32)
EGFR (African American): 41 — ABNORMAL LOW
GFR CALC NON AF AMER: 35 — AB
GLUCOSE: 201 mg/dL — AB (ref 65–99)
Osmolality: 282 (ref 275–301)
POTASSIUM: 3.6 mmol/L (ref 3.5–5.1)
SGOT(AST): 30 U/L (ref 15–37)
SGPT (ALT): 14 U/L (ref 12–78)
Sodium: 134 mmol/L — ABNORMAL LOW (ref 136–145)
Total Protein: 5.1 g/dL — ABNORMAL LOW (ref 6.4–8.2)

## 2014-01-24 LAB — URINALYSIS, COMPLETE
BILIRUBIN, UR: NEGATIVE
Glucose,UR: NEGATIVE mg/dL (ref 0–75)
KETONE: NEGATIVE
NITRITE: NEGATIVE
Ph: 6 (ref 4.5–8.0)
Protein: 100
SPECIFIC GRAVITY: 1.025 (ref 1.003–1.030)
Squamous Epithelial: 14
WBC UR: 77 /HPF (ref 0–5)

## 2014-01-24 LAB — PHENYTOIN LEVEL, TOTAL: DILANTIN: 23 ug/mL — AB (ref 10.0–20.0)

## 2014-01-24 LAB — PROTIME-INR
INR: 1
Prothrombin Time: 13.4 secs (ref 11.5–14.7)

## 2014-01-24 LAB — MAGNESIUM: Magnesium: 1.6 mg/dL — ABNORMAL LOW

## 2014-01-24 LAB — TROPONIN I: Troponin-I: <0.02 ng/mL

## 2014-01-25 ENCOUNTER — Ambulatory Visit: Payer: Self-pay | Admitting: Internal Medicine

## 2014-01-25 LAB — CBC WITH DIFFERENTIAL/PLATELET
BASOS PCT: 0.4 %
Basophil #: 0 10*3/uL (ref 0.0–0.1)
Eosinophil #: 0.1 10*3/uL (ref 0.0–0.7)
Eosinophil %: 0.9 %
HCT: 21.4 % — AB (ref 35.0–47.0)
HGB: 7.3 g/dL — AB (ref 12.0–16.0)
LYMPHS PCT: 9 %
Lymphocyte #: 0.8 10*3/uL — ABNORMAL LOW (ref 1.0–3.6)
MCH: 29.5 pg (ref 26.0–34.0)
MCHC: 34.1 g/dL (ref 32.0–36.0)
MCV: 86 fL (ref 80–100)
MONOS PCT: 5.1 %
Monocyte #: 0.4 x10 3/mm (ref 0.2–0.9)
Neutrophil #: 7.3 10*3/uL — ABNORMAL HIGH (ref 1.4–6.5)
Neutrophil %: 84.6 %
PLATELETS: 309 10*3/uL (ref 150–440)
RBC: 2.48 10*6/uL — ABNORMAL LOW (ref 3.80–5.20)
RDW: 13.5 % (ref 11.5–14.5)
WBC: 8.6 10*3/uL (ref 3.6–11.0)

## 2014-01-25 LAB — MAGNESIUM: MAGNESIUM: 2 mg/dL

## 2014-01-25 LAB — BASIC METABOLIC PANEL
Anion Gap: 7 (ref 7–16)
BUN: 33 mg/dL — ABNORMAL HIGH (ref 7–18)
CHLORIDE: 103 mmol/L (ref 98–107)
CO2: 26 mmol/L (ref 21–32)
CREATININE: 1.12 mg/dL (ref 0.60–1.30)
Calcium, Total: 7.5 mg/dL — ABNORMAL LOW (ref 8.5–10.1)
EGFR (African American): >60
GFR CALC NON AF AMER: 54 — AB
GLUCOSE: 114 mg/dL — AB (ref 65–99)
Osmolality: 280 (ref 275–301)
Potassium: 3.3 mmol/L — ABNORMAL LOW (ref 3.5–5.1)
Sodium: 136 mmol/L (ref 136–145)

## 2014-01-25 LAB — PHENYTOIN LEVEL, TOTAL: Dilantin: 19.9 ug/mL (ref 10.0–20.0)

## 2014-01-26 LAB — PHENYTOIN LEVEL, TOTAL: Dilantin: 14 ug/mL (ref 10.0–20.0)

## 2014-01-26 LAB — BASIC METABOLIC PANEL
Anion Gap: 6 — ABNORMAL LOW (ref 7–16)
BUN: 29 mg/dL — ABNORMAL HIGH (ref 7–18)
Calcium, Total: 7.6 mg/dL — ABNORMAL LOW (ref 8.5–10.1)
Chloride: 105 mmol/L (ref 98–107)
Co2: 27 mmol/L (ref 21–32)
Creatinine: 1.04 mg/dL (ref 0.60–1.30)
EGFR (African American): >60
GFR CALC NON AF AMER: 59 — AB
Glucose: 103 mg/dL — ABNORMAL HIGH (ref 65–99)
OSMOLALITY: 282 (ref 275–301)
Potassium: 3.6 mmol/L (ref 3.5–5.1)
Sodium: 138 mmol/L (ref 136–145)

## 2014-01-26 LAB — CBC WITH DIFFERENTIAL/PLATELET
BASOS PCT: 0.7 %
Basophil #: 0 10*3/uL (ref 0.0–0.1)
EOS ABS: 0.1 10*3/uL (ref 0.0–0.7)
Eosinophil %: 1.4 %
HCT: 22.5 % — ABNORMAL LOW (ref 35.0–47.0)
HGB: 7.6 g/dL — AB (ref 12.0–16.0)
Lymphocyte #: 0.7 10*3/uL — ABNORMAL LOW (ref 1.0–3.6)
Lymphocyte %: 11.2 %
MCH: 29.2 pg (ref 26.0–34.0)
MCHC: 33.9 g/dL (ref 32.0–36.0)
MCV: 86 fL (ref 80–100)
MONO ABS: 0.4 x10 3/mm (ref 0.2–0.9)
Monocyte %: 6.6 %
NEUTROS ABS: 5 10*3/uL (ref 1.4–6.5)
Neutrophil %: 80.1 %
PLATELETS: 328 10*3/uL (ref 150–440)
RBC: 2.61 10*6/uL — ABNORMAL LOW (ref 3.80–5.20)
RDW: 13.8 % (ref 11.5–14.5)
WBC: 6.2 10*3/uL (ref 3.6–11.0)

## 2014-01-26 LAB — MAGNESIUM: Magnesium: 2 mg/dL

## 2014-01-27 LAB — BASIC METABOLIC PANEL
Anion Gap: 6 — ABNORMAL LOW (ref 7–16)
BUN: 23 mg/dL — AB (ref 7–18)
CALCIUM: 7.6 mg/dL — AB (ref 8.5–10.1)
CHLORIDE: 105 mmol/L (ref 98–107)
CREATININE: 0.88 mg/dL (ref 0.60–1.30)
Co2: 27 mmol/L (ref 21–32)
EGFR (Non-African Amer.): >60
Glucose: 97 mg/dL (ref 65–99)
OSMOLALITY: 279 (ref 275–301)
Potassium: 3.1 mmol/L — ABNORMAL LOW (ref 3.5–5.1)
Sodium: 138 mmol/L (ref 136–145)

## 2014-01-27 LAB — MAGNESIUM: MAGNESIUM: 2 mg/dL

## 2014-01-27 LAB — URINE CULTURE

## 2014-01-29 LAB — CULTURE, BLOOD (SINGLE)

## 2014-02-06 LAB — URINALYSIS, COMPLETE
Bilirubin,UR: NEGATIVE
Glucose,UR: NEGATIVE mg/dL (ref 0–75)
Ketone: NEGATIVE
NITRITE: NEGATIVE
Ph: 6 (ref 4.5–8.0)
Protein: 30
Specific Gravity: 1.025 (ref 1.003–1.030)
Squamous Epithelial: NONE SEEN

## 2014-02-06 LAB — CBC
HCT: 24.2 % — ABNORMAL LOW (ref 35.0–47.0)
HGB: 8 g/dL — AB (ref 12.0–16.0)
MCH: 29.6 pg (ref 26.0–34.0)
MCHC: 33.2 g/dL (ref 32.0–36.0)
MCV: 89 fL (ref 80–100)
Platelet: 372 10*3/uL (ref 150–440)
RBC: 2.72 10*6/uL — AB (ref 3.80–5.20)
RDW: 15.3 % — AB (ref 11.5–14.5)
WBC: 7.5 10*3/uL (ref 3.6–11.0)

## 2014-02-06 LAB — COMPREHENSIVE METABOLIC PANEL
ALK PHOS: 205 U/L — AB
AST: 27 U/L (ref 15–37)
Albumin: 1.6 g/dL — ABNORMAL LOW (ref 3.4–5.0)
Anion Gap: 3 — ABNORMAL LOW (ref 7–16)
BUN: 13 mg/dL (ref 7–18)
Bilirubin,Total: 0.2 mg/dL (ref 0.2–1.0)
CHLORIDE: 109 mmol/L — AB (ref 98–107)
Calcium, Total: 7.8 mg/dL — ABNORMAL LOW (ref 8.5–10.1)
Co2: 30 mmol/L (ref 21–32)
Creatinine: 0.63 mg/dL (ref 0.60–1.30)
EGFR (Non-African Amer.): >60
Glucose: 129 mg/dL — ABNORMAL HIGH (ref 65–99)
OSMOLALITY: 285 (ref 275–301)
Potassium: 3.8 mmol/L (ref 3.5–5.1)
SGPT (ALT): 26 U/L (ref 12–78)
SODIUM: 142 mmol/L (ref 136–145)
Total Protein: 4.8 g/dL — ABNORMAL LOW (ref 6.4–8.2)

## 2014-02-06 LAB — PHENYTOIN LEVEL, TOTAL: Dilantin: 9.8 ug/mL — ABNORMAL LOW (ref 10.0–20.0)

## 2014-02-07 ENCOUNTER — Inpatient Hospital Stay: Payer: Self-pay | Admitting: Family Medicine

## 2014-02-07 LAB — CBC WITH DIFFERENTIAL/PLATELET
BASOS ABS: 0 10*3/uL (ref 0.0–0.1)
Basophil %: 0.6 %
EOS ABS: 0.2 10*3/uL (ref 0.0–0.7)
EOS PCT: 2.7 %
HCT: 22.6 % — ABNORMAL LOW (ref 35.0–47.0)
HGB: 7.4 g/dL — AB (ref 12.0–16.0)
LYMPHS PCT: 10.4 %
Lymphocyte #: 0.6 10*3/uL — ABNORMAL LOW (ref 1.0–3.6)
MCH: 29.6 pg (ref 26.0–34.0)
MCHC: 32.9 g/dL (ref 32.0–36.0)
MCV: 90 fL (ref 80–100)
MONO ABS: 0.5 x10 3/mm (ref 0.2–0.9)
Monocyte %: 7.8 %
Neutrophil #: 4.6 10*3/uL (ref 1.4–6.5)
Neutrophil %: 78.5 %
Platelet: 335 10*3/uL (ref 150–440)
RBC: 2.5 10*6/uL — ABNORMAL LOW (ref 3.80–5.20)
RDW: 15 % — ABNORMAL HIGH (ref 11.5–14.5)
WBC: 5.8 10*3/uL (ref 3.6–11.0)

## 2014-02-07 LAB — BASIC METABOLIC PANEL
ANION GAP: 5 — AB (ref 7–16)
BUN: 12 mg/dL (ref 7–18)
CALCIUM: 7.9 mg/dL — AB (ref 8.5–10.1)
Chloride: 109 mmol/L — ABNORMAL HIGH (ref 98–107)
Co2: 29 mmol/L (ref 21–32)
Creatinine: 0.71 mg/dL (ref 0.60–1.30)
EGFR (African American): >60
EGFR (Non-African Amer.): >60
GLUCOSE: 98 mg/dL (ref 65–99)
OSMOLALITY: 285 (ref 275–301)
Potassium: 3.6 mmol/L (ref 3.5–5.1)
SODIUM: 143 mmol/L (ref 136–145)

## 2014-02-07 LAB — MAGNESIUM: Magnesium: 1.8 mg/dL

## 2014-02-08 LAB — BASIC METABOLIC PANEL
Anion Gap: 5 — ABNORMAL LOW (ref 7–16)
BUN: 11 mg/dL (ref 7–18)
CO2: 28 mmol/L (ref 21–32)
Calcium, Total: 7.5 mg/dL — ABNORMAL LOW (ref 8.5–10.1)
Chloride: 110 mmol/L — ABNORMAL HIGH (ref 98–107)
Creatinine: 0.65 mg/dL (ref 0.60–1.30)
EGFR (African American): >60
EGFR (Non-African Amer.): >60
Glucose: 89 mg/dL (ref 65–99)
OSMOLALITY: 284 (ref 275–301)
Potassium: 3.5 mmol/L (ref 3.5–5.1)
SODIUM: 143 mmol/L (ref 136–145)

## 2014-02-08 LAB — CBC WITH DIFFERENTIAL/PLATELET
BASOS PCT: 1.1 %
Basophil #: 0 10*3/uL (ref 0.0–0.1)
Eosinophil #: 0.2 10*3/uL (ref 0.0–0.7)
Eosinophil %: 4.5 %
HCT: 20.2 % — AB (ref 35.0–47.0)
HGB: 6.4 g/dL — ABNORMAL LOW (ref 12.0–16.0)
LYMPHS PCT: 15.7 %
Lymphocyte #: 0.6 10*3/uL — ABNORMAL LOW (ref 1.0–3.6)
MCH: 28.7 pg (ref 26.0–34.0)
MCHC: 32 g/dL (ref 32.0–36.0)
MCV: 90 fL (ref 80–100)
Monocyte #: 0.4 x10 3/mm (ref 0.2–0.9)
Monocyte %: 9.4 %
Neutrophil #: 2.7 10*3/uL (ref 1.4–6.5)
Neutrophil %: 69.3 %
Platelet: 291 10*3/uL (ref 150–440)
RBC: 2.24 10*6/uL — AB (ref 3.80–5.20)
RDW: 15.3 % — ABNORMAL HIGH (ref 11.5–14.5)
WBC: 3.9 10*3/uL (ref 3.6–11.0)

## 2014-02-08 LAB — ALBUMIN: Albumin: 1.3 g/dL — ABNORMAL LOW (ref 3.4–5.0)

## 2014-02-08 LAB — PHENYTOIN LEVEL, TOTAL: DILANTIN: 9.8 ug/mL — AB (ref 10.0–20.0)

## 2014-02-08 LAB — URINE CULTURE

## 2014-02-09 LAB — CBC WITH DIFFERENTIAL/PLATELET
Basophil #: 0 10*3/uL (ref 0.0–0.1)
Basophil %: 0.9 %
Eosinophil #: 0.2 10*3/uL (ref 0.0–0.7)
Eosinophil %: 4 %
HCT: 26.5 % — AB (ref 35.0–47.0)
HGB: 8.8 g/dL — ABNORMAL LOW (ref 12.0–16.0)
Lymphocyte #: 0.6 10*3/uL — ABNORMAL LOW (ref 1.0–3.6)
Lymphocyte %: 13.4 %
MCH: 29.6 pg (ref 26.0–34.0)
MCHC: 33.1 g/dL (ref 32.0–36.0)
MCV: 89 fL (ref 80–100)
MONOS PCT: 8.6 %
Monocyte #: 0.4 x10 3/mm (ref 0.2–0.9)
Neutrophil #: 3.2 10*3/uL (ref 1.4–6.5)
Neutrophil %: 73.1 %
Platelet: 317 10*3/uL (ref 150–440)
RBC: 2.97 10*6/uL — AB (ref 3.80–5.20)
RDW: 15.1 % — AB (ref 11.5–14.5)
WBC: 4.3 10*3/uL (ref 3.6–11.0)

## 2014-02-10 LAB — CBC WITH DIFFERENTIAL/PLATELET
BASOS ABS: 0 10*3/uL (ref 0.0–0.1)
Basophil %: 0.9 %
EOS PCT: 3.1 %
Eosinophil #: 0.1 10*3/uL (ref 0.0–0.7)
HCT: 24.2 % — ABNORMAL LOW (ref 35.0–47.0)
HGB: 8.1 g/dL — ABNORMAL LOW (ref 12.0–16.0)
LYMPHS PCT: 14 %
Lymphocyte #: 0.6 10*3/uL — ABNORMAL LOW (ref 1.0–3.6)
MCH: 29.6 pg (ref 26.0–34.0)
MCHC: 33.2 g/dL (ref 32.0–36.0)
MCV: 89 fL (ref 80–100)
Monocyte #: 0.5 x10 3/mm (ref 0.2–0.9)
Monocyte %: 11.8 %
NEUTROS PCT: 70.2 %
Neutrophil #: 2.8 10*3/uL (ref 1.4–6.5)
Platelet: 268 10*3/uL (ref 150–440)
RBC: 2.72 10*6/uL — AB (ref 3.80–5.20)
RDW: 14.9 % — ABNORMAL HIGH (ref 11.5–14.5)
WBC: 4 10*3/uL (ref 3.6–11.0)

## 2014-02-10 LAB — BASIC METABOLIC PANEL
Anion Gap: 3 — ABNORMAL LOW (ref 7–16)
BUN: 10 mg/dL (ref 7–18)
CALCIUM: 7.4 mg/dL — AB (ref 8.5–10.1)
Chloride: 109 mmol/L — ABNORMAL HIGH (ref 98–107)
Co2: 28 mmol/L (ref 21–32)
Creatinine: 0.51 mg/dL — ABNORMAL LOW (ref 0.60–1.30)
EGFR (African American): >60
EGFR (Non-African Amer.): >60
GLUCOSE: 104 mg/dL — AB (ref 65–99)
OSMOLALITY: 279 (ref 275–301)
POTASSIUM: 3.3 mmol/L — AB (ref 3.5–5.1)
Sodium: 140 mmol/L (ref 136–145)

## 2014-02-11 LAB — BASIC METABOLIC PANEL
ANION GAP: 5 — AB (ref 7–16)
BUN: 8 mg/dL (ref 7–18)
CHLORIDE: 108 mmol/L — AB (ref 98–107)
Calcium, Total: 7.7 mg/dL — ABNORMAL LOW (ref 8.5–10.1)
Co2: 28 mmol/L (ref 21–32)
Creatinine: 0.73 mg/dL (ref 0.60–1.30)
EGFR (African American): >60
Glucose: 111 mg/dL — ABNORMAL HIGH (ref 65–99)
OSMOLALITY: 280 (ref 275–301)
Potassium: 3.6 mmol/L (ref 3.5–5.1)
Sodium: 141 mmol/L (ref 136–145)

## 2014-02-11 LAB — CULTURE, BLOOD (SINGLE)

## 2014-02-11 LAB — PHENYTOIN LEVEL, TOTAL: Dilantin: 11.7 ug/mL (ref 10.0–20.0)

## 2014-02-11 LAB — AMMONIA: AMMONIA, PLASMA: 24 umol/L (ref 11–32)

## 2014-02-12 LAB — COMPREHENSIVE METABOLIC PANEL
ALBUMIN: 1.4 g/dL — AB (ref 3.4–5.0)
ALK PHOS: 219 U/L — AB
AST: 25 U/L (ref 15–37)
Anion Gap: 5 — ABNORMAL LOW (ref 7–16)
BUN: 7 mg/dL (ref 7–18)
Bilirubin,Total: 0.2 mg/dL (ref 0.2–1.0)
CALCIUM: 7.7 mg/dL — AB (ref 8.5–10.1)
Chloride: 106 mmol/L (ref 98–107)
Co2: 28 mmol/L (ref 21–32)
Creatinine: 0.56 mg/dL — ABNORMAL LOW (ref 0.60–1.30)
EGFR (African American): >60
EGFR (Non-African Amer.): >60
Glucose: 189 mg/dL — ABNORMAL HIGH (ref 65–99)
OSMOLALITY: 281 (ref 275–301)
POTASSIUM: 3.9 mmol/L (ref 3.5–5.1)
SGPT (ALT): 27 U/L (ref 12–78)
SODIUM: 139 mmol/L (ref 136–145)
Total Protein: 4.4 g/dL — ABNORMAL LOW (ref 6.4–8.2)

## 2014-02-12 LAB — CBC WITH DIFFERENTIAL/PLATELET
Basophil #: 0 10*3/uL (ref 0.0–0.1)
Basophil %: 0.5 %
EOS ABS: 0.1 10*3/uL (ref 0.0–0.7)
Eosinophil %: 1.3 %
HCT: 29 % — ABNORMAL LOW (ref 35.0–47.0)
HGB: 9.3 g/dL — ABNORMAL LOW (ref 12.0–16.0)
LYMPHS ABS: 0.5 10*3/uL — AB (ref 1.0–3.6)
Lymphocyte %: 10.8 %
MCH: 28.8 pg (ref 26.0–34.0)
MCHC: 32 g/dL (ref 32.0–36.0)
MCV: 90 fL (ref 80–100)
Monocyte #: 0.3 x10 3/mm (ref 0.2–0.9)
Monocyte %: 6.7 %
Neutrophil #: 3.6 10*3/uL (ref 1.4–6.5)
Neutrophil %: 80.7 %
PLATELETS: 292 10*3/uL (ref 150–440)
RBC: 3.22 10*6/uL — AB (ref 3.80–5.20)
RDW: 15.2 % — AB (ref 11.5–14.5)
WBC: 4.4 10*3/uL (ref 3.6–11.0)

## 2014-02-12 LAB — PHENYTOIN LEVEL, TOTAL: Dilantin: 12.6 ug/mL (ref 10.0–20.0)

## 2014-02-13 LAB — BASIC METABOLIC PANEL
Anion Gap: 6 — ABNORMAL LOW (ref 7–16)
BUN: 7 mg/dL (ref 7–18)
CALCIUM: 7.6 mg/dL — AB (ref 8.5–10.1)
CREATININE: 0.55 mg/dL — AB (ref 0.60–1.30)
Chloride: 105 mmol/L (ref 98–107)
Co2: 27 mmol/L (ref 21–32)
EGFR (Non-African Amer.): >60
Glucose: 170 mg/dL — ABNORMAL HIGH (ref 65–99)
Osmolality: 278 (ref 275–301)
POTASSIUM: 3.6 mmol/L (ref 3.5–5.1)
Sodium: 138 mmol/L (ref 136–145)

## 2014-02-13 LAB — HEMOGLOBIN: HGB: 9 g/dL — AB (ref 12.0–16.0)

## 2014-02-24 ENCOUNTER — Ambulatory Visit: Payer: Self-pay | Admitting: Internal Medicine

## 2014-03-27 DEATH — deceased

## 2014-12-15 IMAGING — CR DG CHEST 1V PORT
1 series · 1 of 1 positions shown · non-contrast
Comparison: 05/02/2013

CLINICAL DATA: Respiratory failure

PORTABLE CHEST - 1 VIEW

[AP]
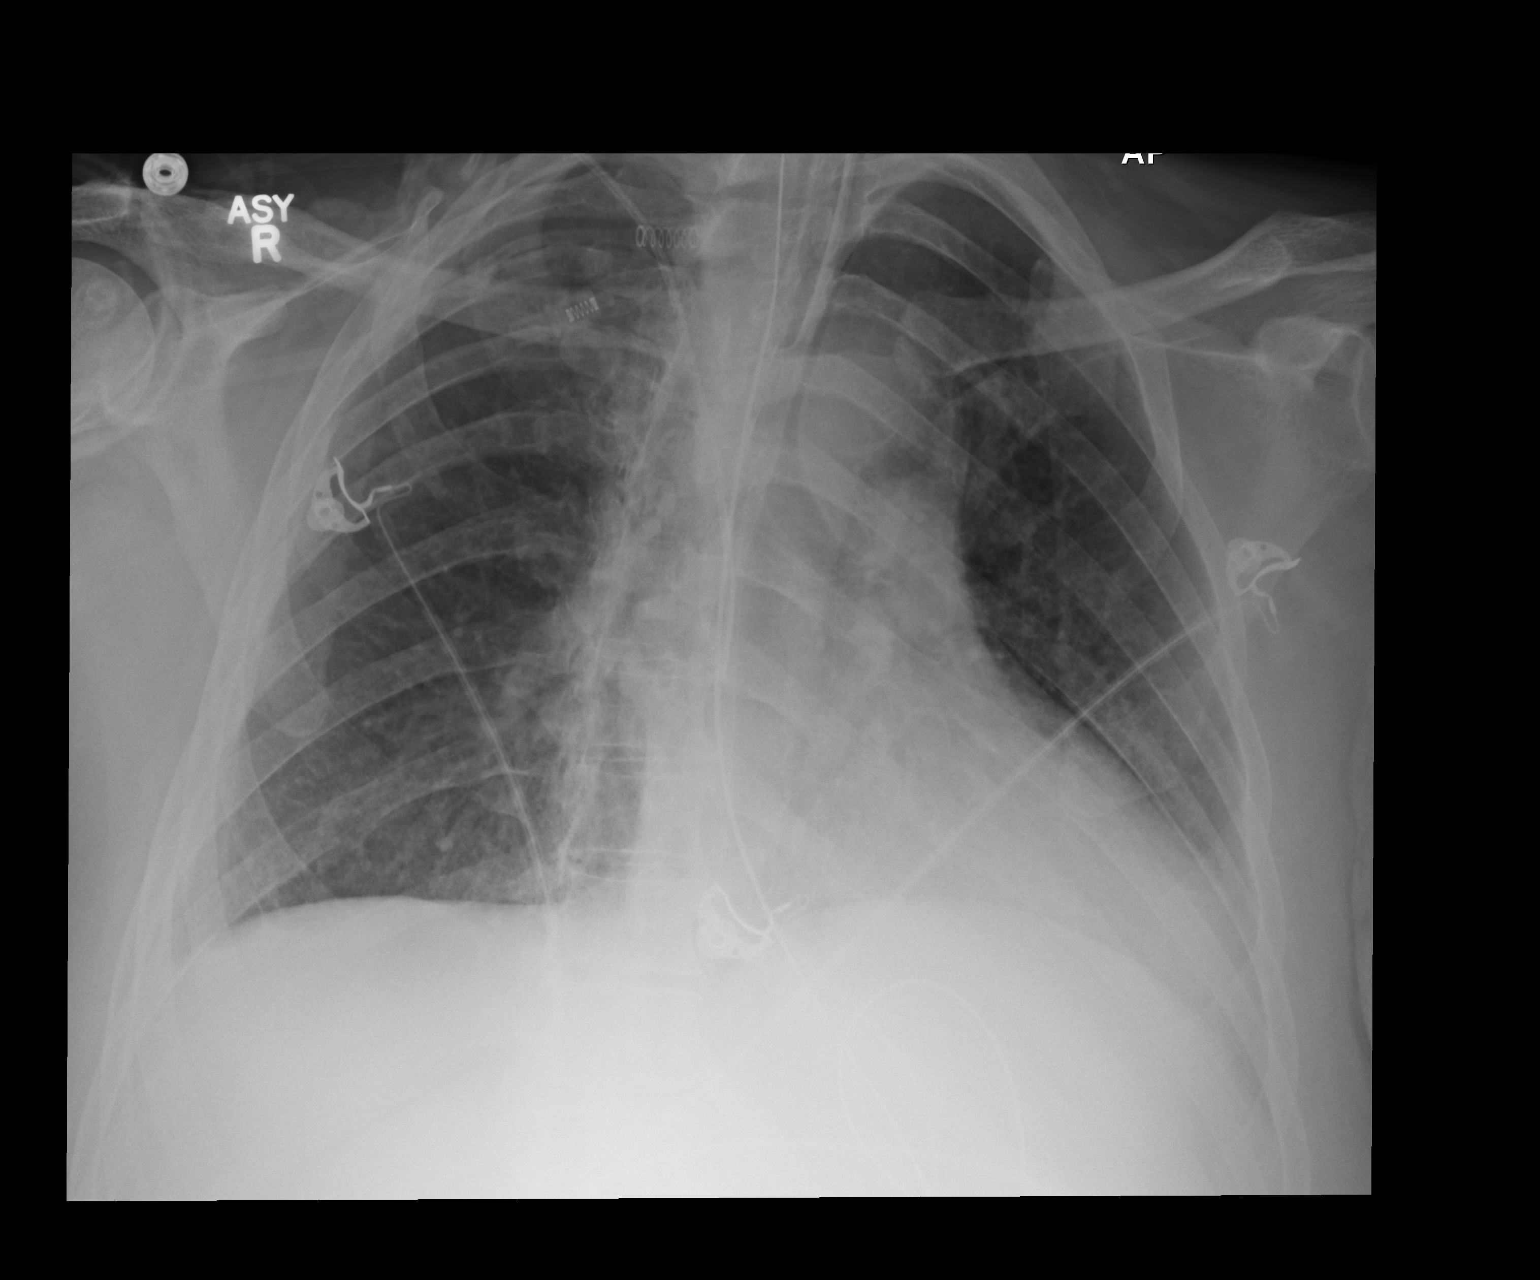

[1 of 1 positions shown; findings below may reference images not displayed]

FINDINGS: Endotracheal tube remains in good position.  Right
jugular catheter tip in the SVC.  NG tube in the stomach.

Increased left lower lobe atelectasis compared with yesterday.
Negative for edema or effusion.
IMPRESSION: Support lines remain in good position.  Mild increase in left lower
lobe atelectasis.

## 2014-12-17 IMAGING — CT CT HEAD W/O CM
1 of 2 series · 13 of 30 positions shown, 17 images · non-contrast
Comparison: 05/05/2013 CT and 05/03/2013 MR.

CLINICAL DATA: Nonreactive pupils.

CT HEAD WITHOUT CONTRAST
TECHNIQUE: Contiguous axial images were obtained from the base of
the skull through the vertex without contrast.

[Series 2: head routine 4.8 h37s · axial · 0.43mm/px · z∈[+132,+275]mm · 13 of 36 slices shown, 17 images]
[im 3/36  brain]
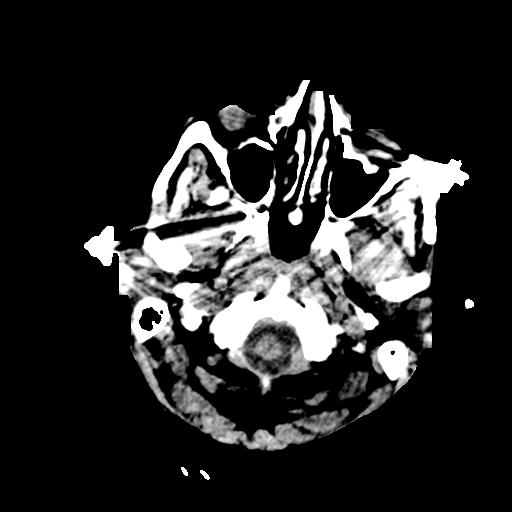
[im 3/36  bone]
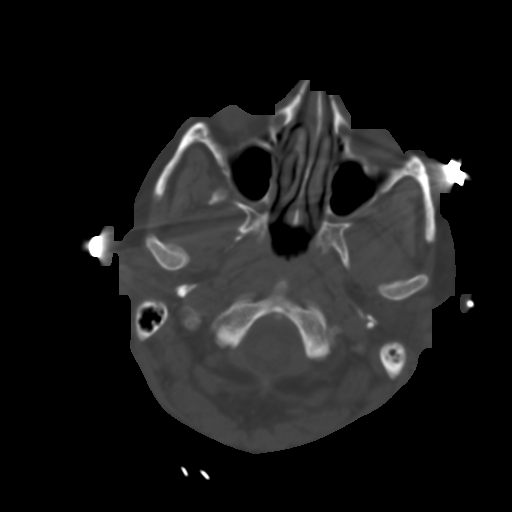
[im 6/36  brain]
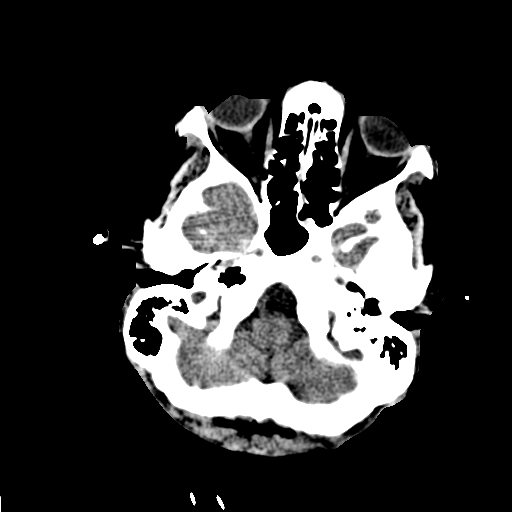
[im 8/36  brain]
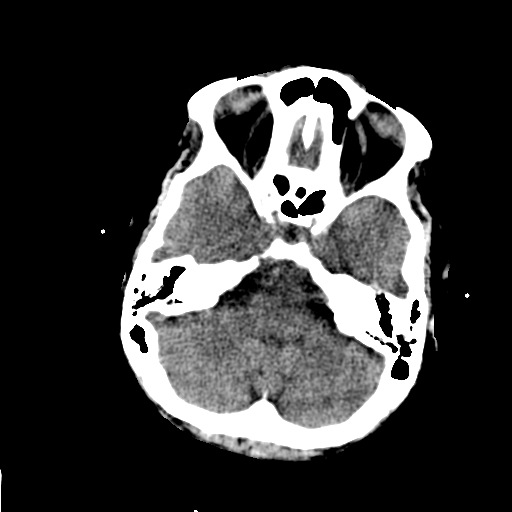
[im 11/36  brain]
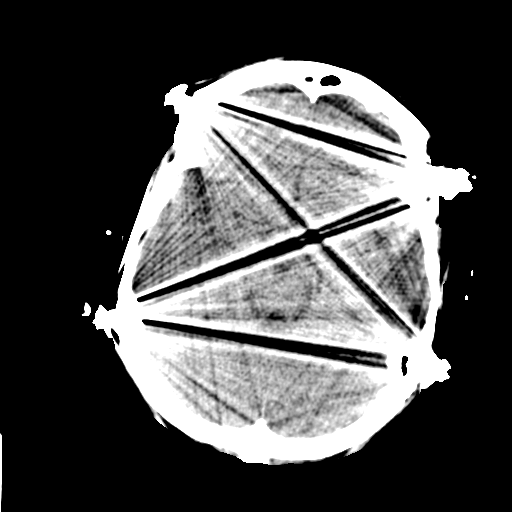
[im 13/36  brain]
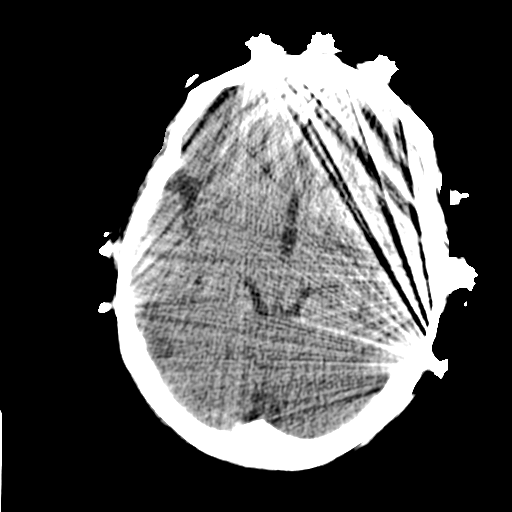
[im 13/36  bone]
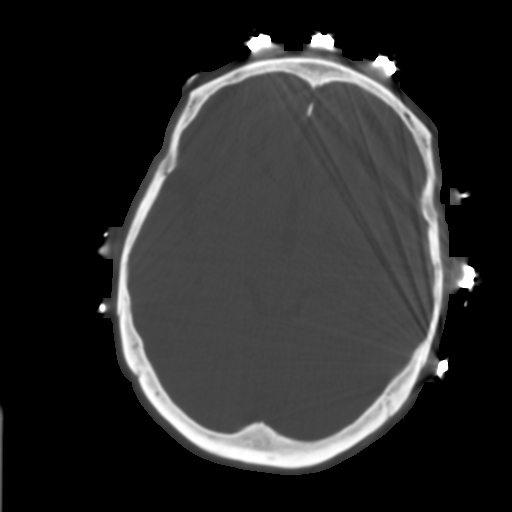
[im 16/36  brain]
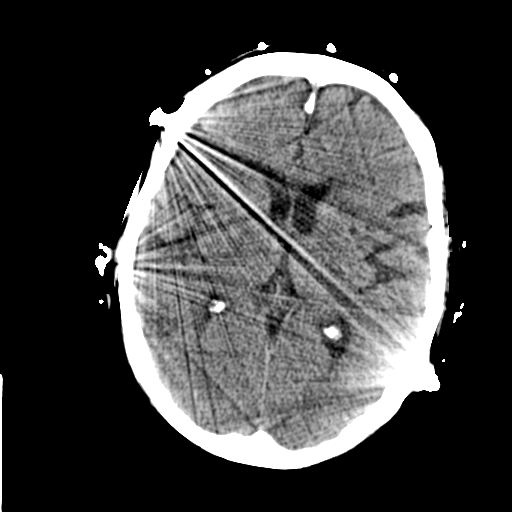
[im 18/36  brain]
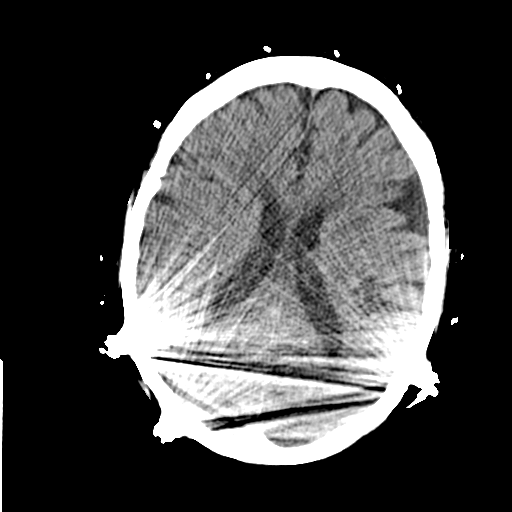
[im 21/36  brain]
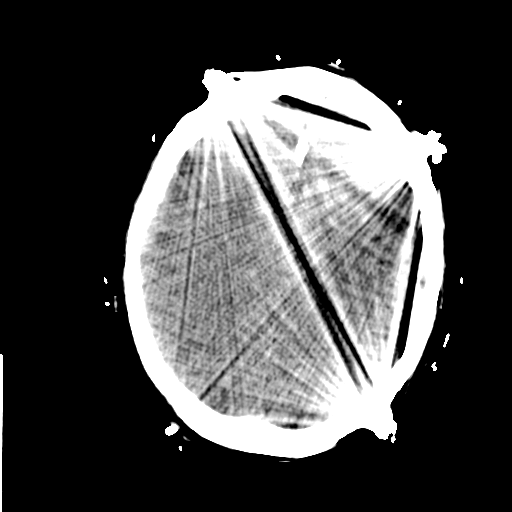
[im 23/36  brain]
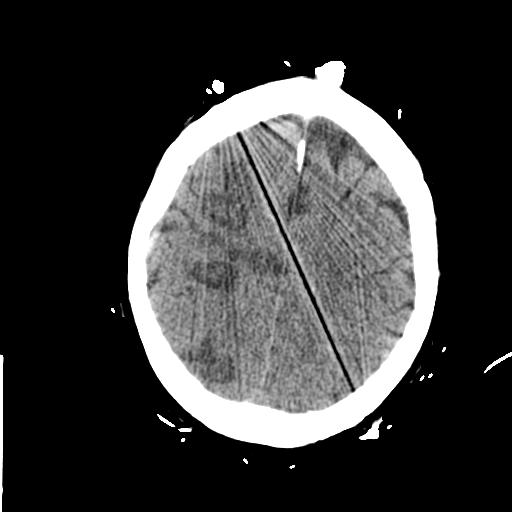
[im 23/36  bone]
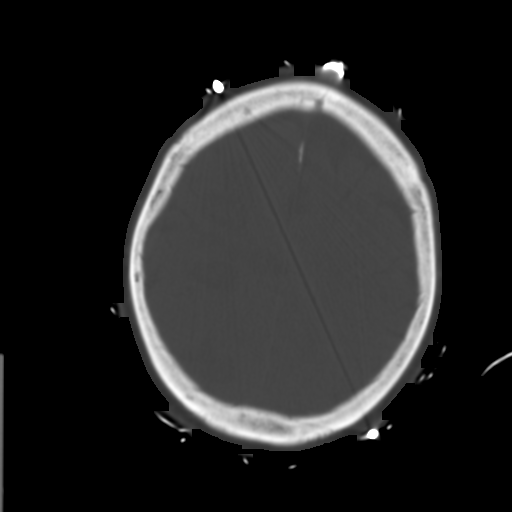
[im 26/36  brain]
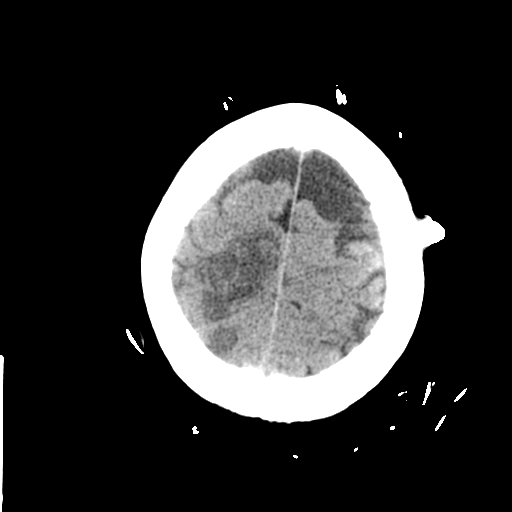
[im 28/36  brain]
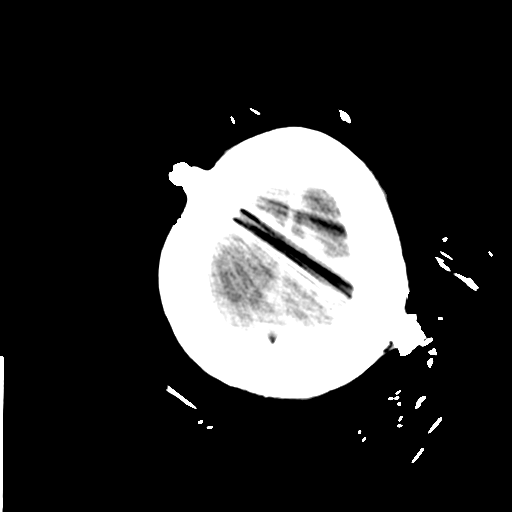
[im 31/36  brain]
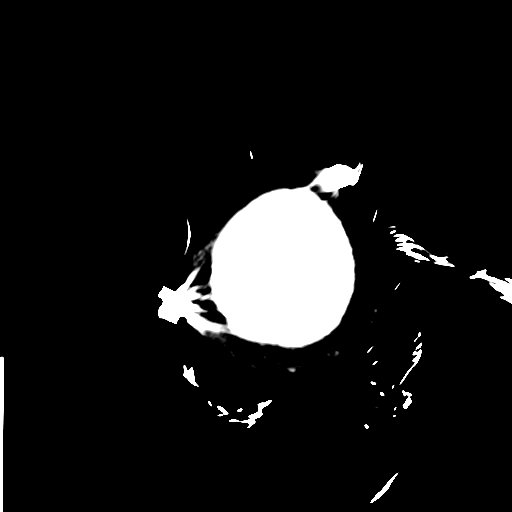
[im 33/36  brain]
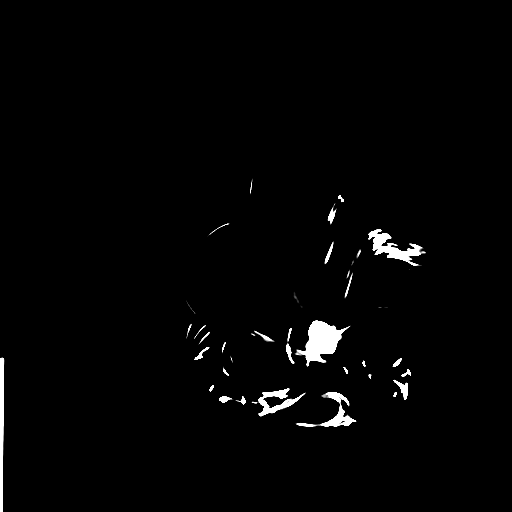
[im 33/36  bone]
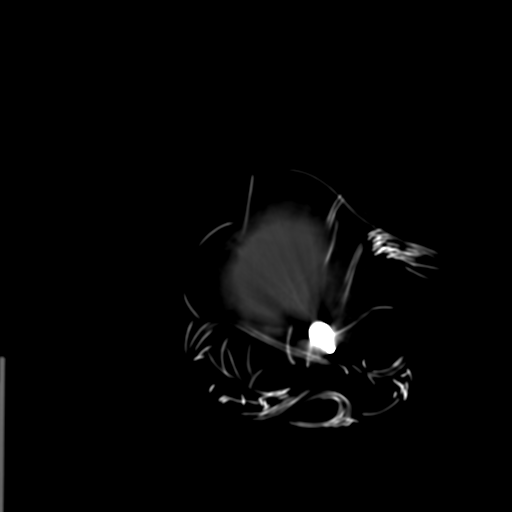

[13 of 30 positions shown; findings below may reference images not displayed]

FINDINGS: Right frontal lobe, right parietal lobe, right occipital
lobe and posterior right temporal lobe acute/subacute infarcts.
Surrounding edema with local mass effect.  Minimal petechial
hemorrhage suspected particularly along the superior margin of the
infarct.  No gross hemorrhage noted.  No midline shift.

The left hemispheric and infratentorial infarcts noted on the
recent MRI are not as well demonstrated on the present CT.

No intracranial mass lesion detected on this unenhanced exam..
IMPRESSION: Question minimal petechial hemorrhage along the periphery of the
right hemispheric infarcts as noted above.

Critical Value/emergent results were called by telephone at the
time of interpretation on 05/06/2013 at [DATE] p.m. to Dr.
Jumper., who verbally acknowledged these results.

## 2014-12-18 IMAGING — CR DG ABD PORTABLE 1V
2 series · 2 of 2 positions shown · non-contrast
Comparison: None available.

CLINICAL DATA: Ileus.

PORTABLE ABDOMEN - 1 VIEW

[AP (1 of 2)]
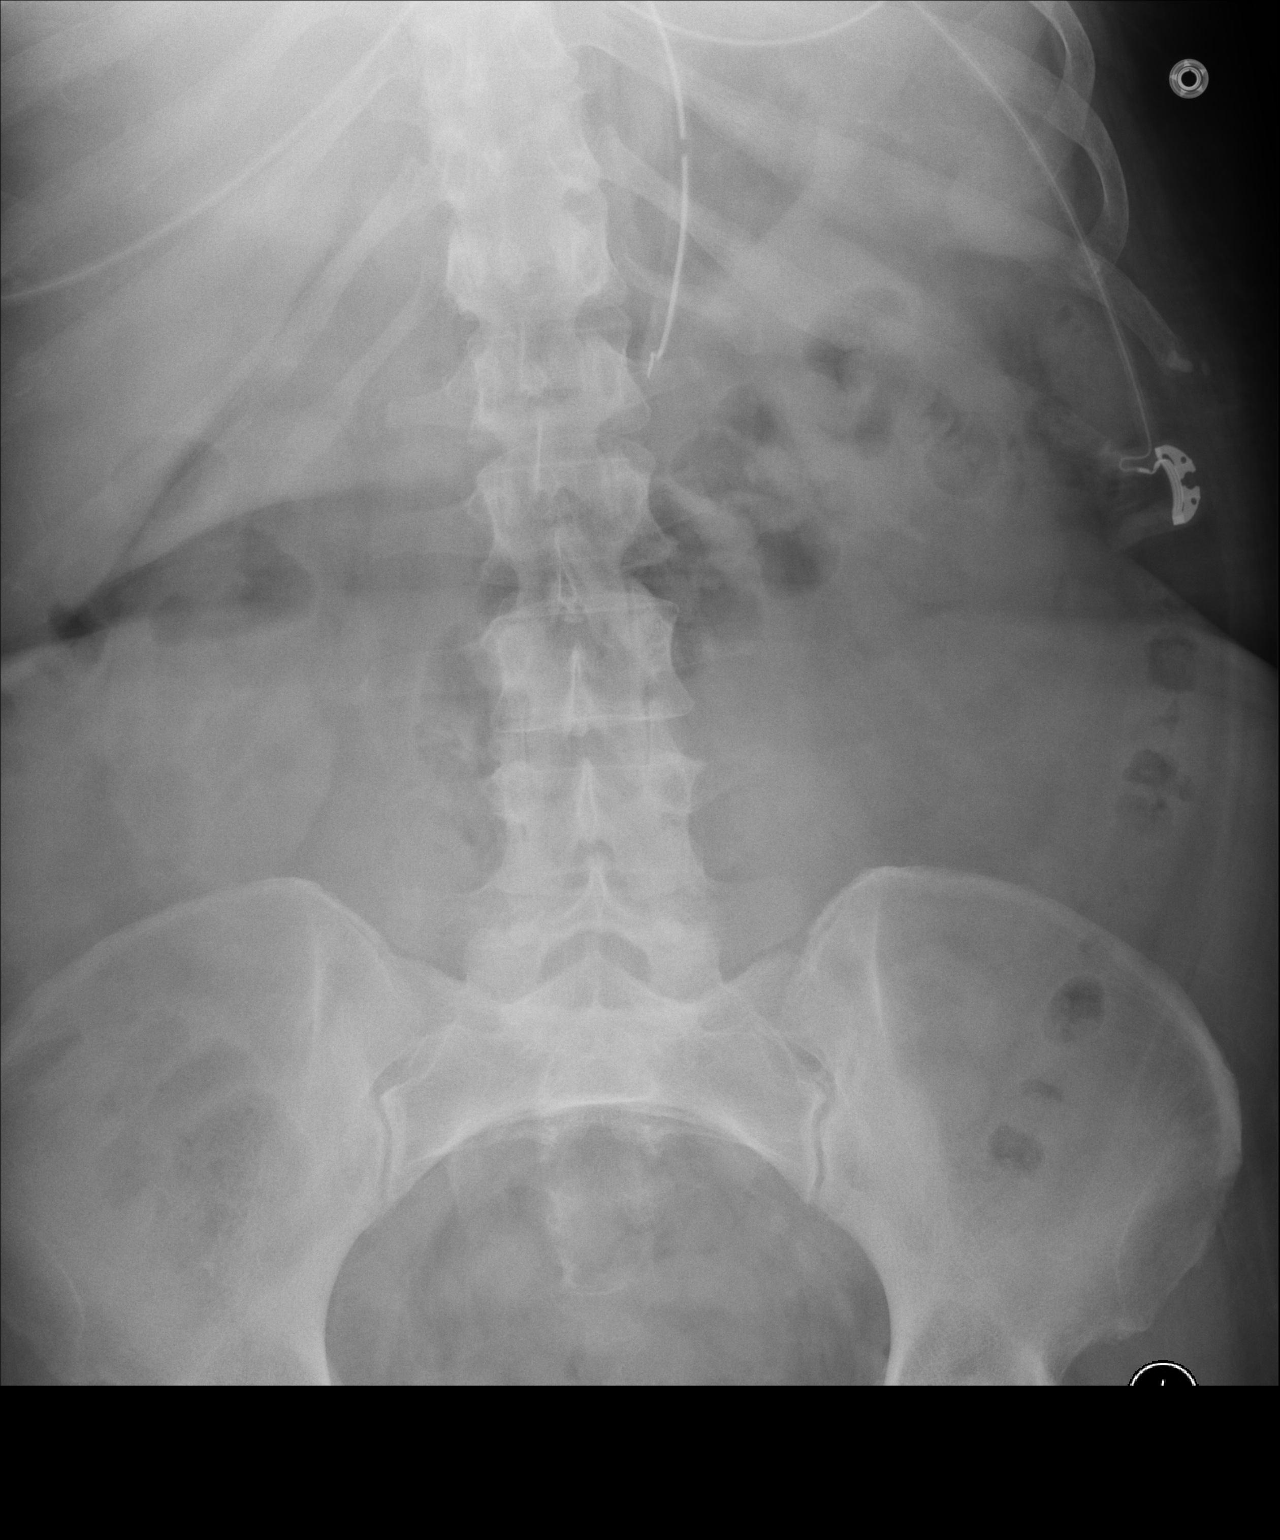

[AP (2 of 2)]
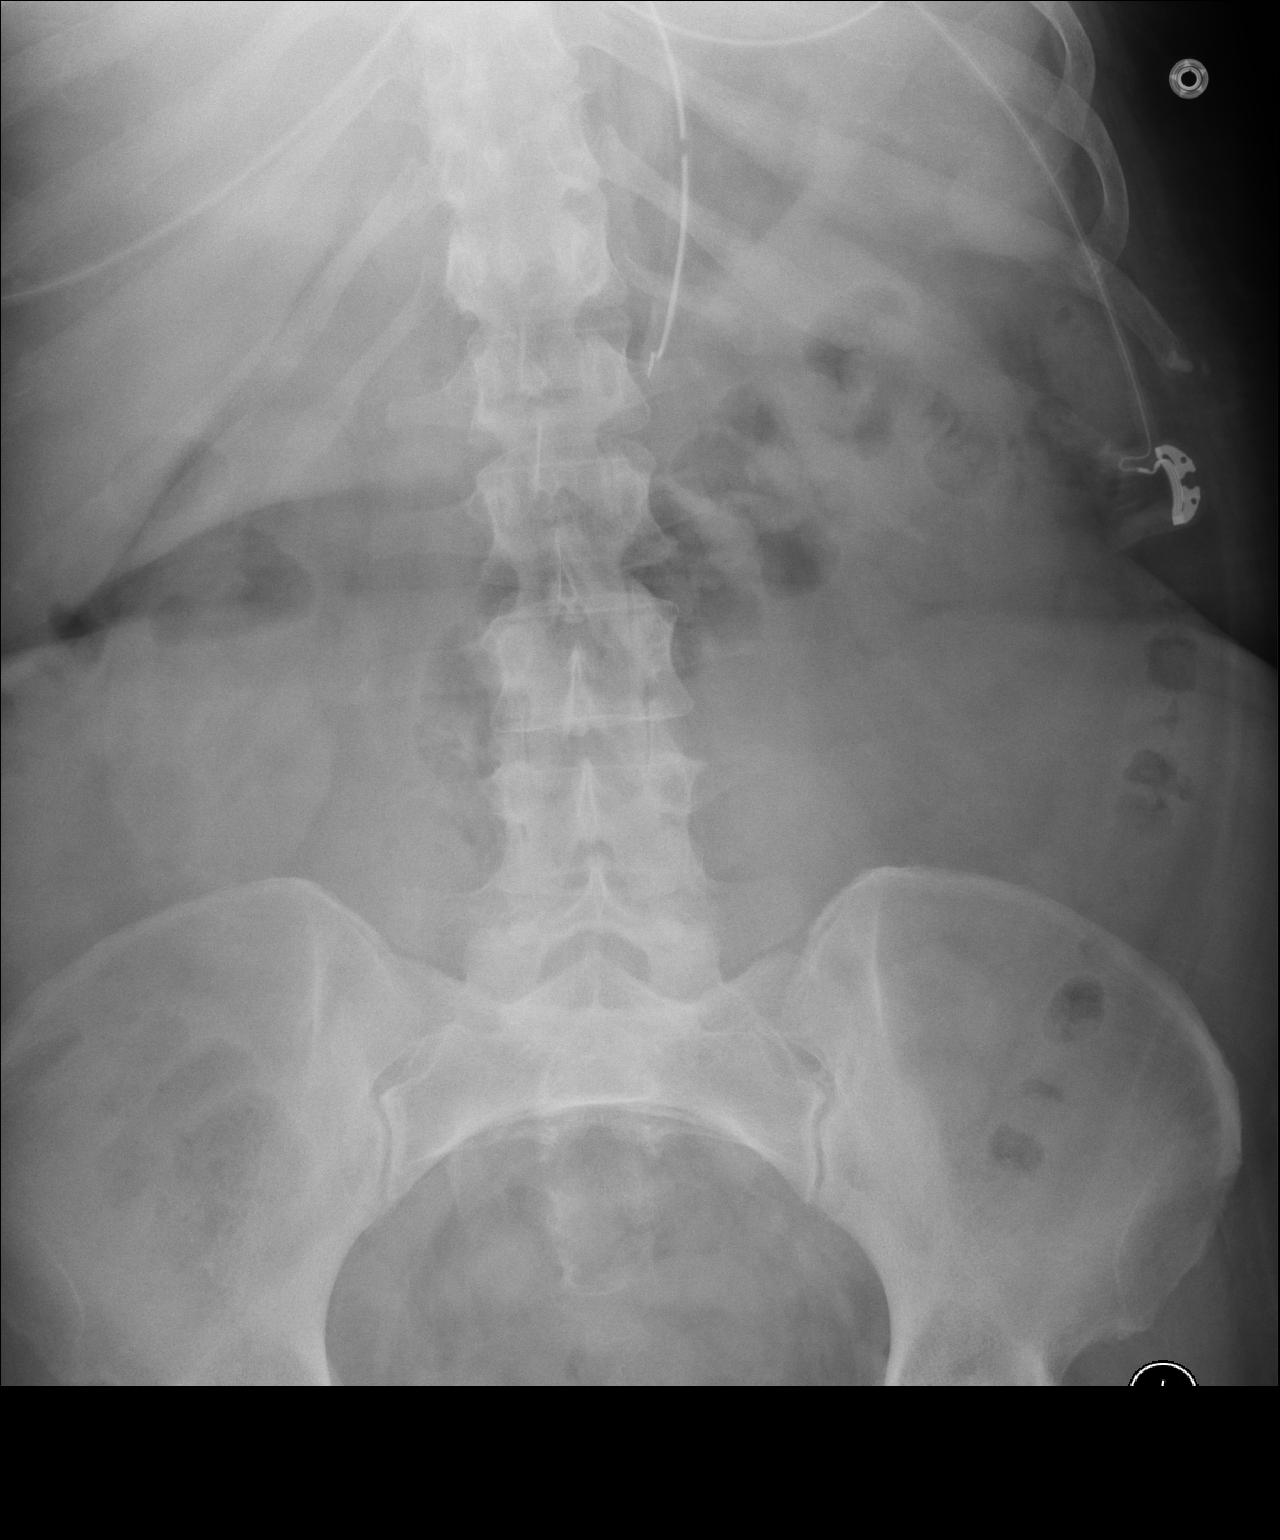

[2 of 2 positions shown; findings below may reference images not displayed]

FINDINGS: The sideport of an NG tube is within the fundus the
stomach.  The bowel gas pattern is unremarkable.  There is no
evidence for obstruction or free air.  The axial skeleton is
unremarkable.
IMPRESSION: Negative one-view abdomen.

## 2015-02-13 NOTE — Discharge Summary (Signed)
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   CONSULTATION: Neurology consultation with Dr Jeral FruitZeylikaman.  LABORATORY, DIAGNOSTIC, AND RADIOLOGICAL DATA: CT of the head showed no acute abnormalities. CBC normal. Glucose 282, creatinine 1 to 1.39. Electrolytes normal.   DISPOSITION: The patient is being discharged home. She has been given contact information for neurologist, ophthalmologist, and cardiologist. The patient will follow up with her PCP, [facility].   DIET: Low sodium, 1800 calorie ADA diet.   DISCHARGE MEDICATIONS:  Wellbutrin 75 mg daily, Atacand 8 mg b.i.d. Crestor 20 mg daily. Lopressor 50 mg b.i.d. promethazine 25 mg t.i.d. p.r.n., NovoLog sliding scale, Lantus sliding scale. Plavix 75 mg daily. Hydralazine 25 mg 2 tablets 4 times a day, clonidine 0.1 mg b.i.d., omeprazole 20 mg daily. Trilipix 135 mg day, Fioricet 1 tablet q.4 hours p.r.n. headache.   HOSPITAL COURSE: The patient is a 59 year old female with history of diabetes, recurrent CVA, hypertension and chronic diastolic congestive heart failure who was recently admitted to [facility] in October for acute midbrain cerebrovascular accident. Since the cerebrovascular accident .the patient had been having diplopia and developed severe headache, nausea and vomiting and was unable to take her medications, therefore she developed hypertensive emergency. She presented with elevated blood pressure. The patient was admitted to the hospital and started on p.r.n. antiemetics, which resulted in resolution of her nausea and vomiting. She was able to take her  medications and her blood pressure improved. She continued to complain of diplopia and the patient and her family requested a neurology evaluation; therefore neurology consultation with Dr. Jeral FruitZeylikaman was obtained. 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 She was advised to cover the eye to prevent diplopia which has resulted in recurrent headache, nausea and vomiting. The patient was also aggressively counseled about risk factor control including that of her diabetes, hypertension, and hyperlipidemia. The family also requested that patient be given and referral for an ophthalmologist, a neurologist, and cardiologist for outpatient and that information was provided to the patient and her family prior to discharge all of the questions were answered and the patient was discharged home in a stable condition.   TIME SPENT: 45 minutes.    ____________________________ Darrick MeigsSangeeta Deseray Daponte, MD sp:ljs D: 08/30/2012 14:06:46 ET T: 08/30/2012 14:33:38 ET JOB#: [id number]  cc: Darrick MeigsSangeeta Holbert Caples, MD, <Dictator> Darrick MeigsSANGEETA Brylynn Hanssen MD ELECTRONICALLY SIGNED 09/01/2012 12:08

## 2015-02-13 NOTE — Discharge Summary (Signed)
PATIENT NAME:  Brenda Pope, Brenda Pope MR#:  [id number] DATE OF BIRTH:  Jun 11, 1956  DATE OF ADMISSION:  08/12/2012 DATE OF DISCHARGE:  08/14/2012  PRIMARY CARE PHYSICIAN:  [name], FNP.   CONSULTATION: Neurology, Dr. Katrinka BlazingSmith.   FINAL DIAGNOSES:  1. Acute cerebrovascular accident. 2. Hypertension. 3. Diabetes.  4. Urinary tract infection.  5. Dehydration. 6. Gastroesophageal reflux disease.   CODE STATUS: FULL CODE.   CONDITION: Stable.   HOME MEDICATIONS:  1. Wellbutrin 75 mg p.o. daily. 2. Atacand 80 mg p.o. b.i.d.  3. Crestor 20 mg p.o. at bedtime.  4. Omeprazole 40 mg p.o. daily.  5. Lopressor 50 mg p.o. b.i.d.  6. Promethazine 25 mg 1 tablet 3 times a day p.r.n. for nausea and vomiting.  7. NovoLog subcutaneous sliding scale before meals and at bedtime. 8. Lantus subcutaneous once a day at bedtime. 9. Sliding scale depending on bedtime glucose.   MEDICATIONS:  The following two medications are new:  1. Plavix 75 mg p.o. daily.  2. Hydralazine 50 milligrams p.o. q.i.d.   Stop medication: Lasix 40 mg two tablets b.i.d.   DIET: Low sodium, low fat, low cholesterol ADA diet.   ACTIVITY: As tolerated.   FOLLOW-UP CARE:  1. Follow-up with PCP within 1 to 2 weeks.  2. Follow-up with Dr. Katrinka BlazingSmith, neurology, within 1 to 2 weeks.   REASON FOR ADMISSION: Elevated blood pressure with visual impairment.   HOSPITAL COURSE: The patient is a 59 year old Caucasian female with a history of hypertension and diabetes who presented to the ED with high blood pressure and double vision, blurred vision. The patient's blood pressure was 180 in ED. CT scan of head was non diagnostic, so the patient was admitted for hypertensive urgency and visual impairment, likely due to hypertensive urgency, need to rule out cerebrovascular accident. For detailed history and physical examination, please refer to the admission note dictated by Dr. Tilda FrancoAkuneme.   After admission, the patient has been placed on  telemonitor. She was treated with hydralazine and home hypertension medication. In addition, the patient has been treated with aspirin and statin for possible cerebrovascular accident. MRI of brain showed punctate acute cerebrovascular accident. MRI of the neck showed no evidence of significant stenosis. MRI of the brain shows bilateral posterior cerebral arterial multifocal stenosis. Dr. Katrinka BlazingSmith, neurologist, suggested Plavix and continue statin. Since the patient has mild dehydration, Lasix was on hold, start hydralazine for hypertension. The patient's blood pressure is not well controlled so we increased hydralazine to 50 mg p.o. q.i.d.   For diabetes the patient has been treated with sliding scale and Lantus at bedtime. The patient is clinically stable. She will be discharged to home today.   The patient's vital signs today: Temperature 98.2, blood pressure 145/84, pulse 63. Physical examination is unremarkable except for left leg weakness about 4 out of 5. The patient is clinically stable and will be discharged to home today. I discussed the patient's discharge plan with the patient and the patient's husband, and nurse.      TIME SPENT: About 38 minutes.    ____________________________ Shaune PollackQing Ramil Edgington, MD qc:ap D: 08/14/2012 14:52:28 ET T: 08/16/2012 10:08:59 ET JOB#: [id number]  cc: Shaune PollackQing Thalya Fouche, MD, <Dictator> [name], FNP Shaune PollackQING Kambryn Dapolito MD ELECTRONICALLY SIGNED 08/18/2012 20:10

## 2015-02-13 NOTE — Consult Note (Signed)
Referring Physician:  Carola Frost   Primary Care Physician:  Ovidio Kin : [facility], [address] [place], [place] [address]  Reason for Consult:  Admit Date: 12-Aug-2012   Chief Complaint: diplopia and N/V   Reason for Consult: possible stroke   History of Present Illness:  History of Present Illness:   59 yo RHD presents with acute onset of diplopia, N/V and vertigo for one day.  Pt reports that she could not get up because she was so dizzy and that she had a headache and severe N/V.  She has never had anything like this before.  She reports having a previous stroke but that this only gave her mild L sided weakness in which returned to normal.  Pt admits that DM and HTN have not been controlled at home and also that she started some new diet.  ROS:   General fever    HEENT diplopia    Lungs no complaints    Cardiac no complaints    GI nausea/vomiting    GU no complaints    Musculoskeletal no complaints    Extremities no complaints    Skin no complaints    Neuro headache    Endocrine no complaints    Psych no complaints   Past Medical/Surgical Hx:  CHF:   Multi-drug Resistant Organism (MDRO): Positive culture for MRSA.  Migraines: per pt/husband  reflux:   htn:   diabetes:   enlarged spleen:   tia:   Appendectomy:   Cholecystectomy:   Past Medical/ Surgical Hx:   Past Medical History as above    Past Surgical History as above   Home Medications: Medication Instructions Last Modified Date/Time  furosemide 40 mg oral tablet 2 tab(s) orally 2 times a day 16-Oct-13 23:01  metoprolol tartrate 50 mg oral tablet 1 tab(s) orally 2 times a day 16-Oct-13 23:01  promethazine 25 mg oral tablet 1 tab(s) orally 3 times a day, As Needed- for Nausea, Vomiting  16-Oct-13 23:01  NovoLog  subcutaneous, sliding scale AC and HS 16-Oct-13 23:01  Lantus 100 units/mL subcutaneous solution  subcutaneous once a day (at bedtime), sliding scale  depending on HS glucose 16-Oct-13 23:01  Wellbutrin 75 mg oral tablet 1 tab(s) orally once a day 16-Oct-13 23:01  Atacand 8 mg oral tablet 1 tab(s) orally 2 times a day 16-Oct-13 23:01  Crestor 20 mg oral tablet 1 tab(s) orally once a day (at bedtime) 16-Oct-13 23:01  omeprazole 40 mg oral delayed release capsule 1 cap(s) orally once a day 16-Oct-13 23:01   Allergies:  Penicillin: Rash  Social/Family History:  Employment Status: unemployed   Lives With: spouse   Living Arrangements: house   Social History: denies EtOH, tob and illicits   Family History: no strokes, + CAD   Vital Signs: **Vital Signs.:   17-Oct-13 08:00   Temperature Temperature (F) 96.7   Celsius 35.9   Pulse Pulse 65   Respirations Respirations 20   Systolic BP Systolic BP 299   Diastolic BP (mmHg) Diastolic BP (mmHg) 63   Mean BP 92   Pulse Ox % Pulse Ox % 97   Oxygen Delivery Room Air/ 21 %   Physical Exam:  General: overweight, NAD resting comfortably in bed   HEENT: normocephalic, sclera nonicteric, oropharynx clear   Neck: supple, no JVD, no bruits   Chest: CTA B, no wheezing, good movement   Cardiac: RRR, no murmurs, no edema, 2+ pulses   Extremities: no C/C, FROM  Neurologic Exam:  Mental Status: alert and oriented x 3, normal speech and language, follows complex commands   Cranial Nerves: PERRLA, EOMI however pt reports diplopia, nl VF, face symmetric, tongue midline, shoulder shrug equal   Motor Exam: 5/5 B except 4/5 L LE, tone, no tremor   Deep Tendon Reflexes: 1+/4 B, downgoing Plantars B   Sensory Exam: inconsistent but mild stocking and glove loss of pinprick,  decrease vibration as well in B LE   Coordination: mild dysmetria in R hand only   Lab Results: Hepatic:  16-Oct-13 22:23    Bilirubin, Total 0.5   Alkaline Phosphatase 102   SGPT (ALT) 27   SGOT (AST) 28   Total Protein, Serum 7.0   Albumin, Serum  3.3  Routine Chem:  16-Oct-13 22:23    B-Type Natriuretic Peptide ([facility])   317 (Result(s) reported on 12 Aug 2012 at 12:54AM.)   Glucose, Serum  104   BUN 17   Creatinine (comp) 0.83   Sodium, Serum 142   Potassium, Serum  3.3   Chloride, Serum 103   CO2, Serum 31   Calcium (Total), Serum 9.0   Osmolality (calc) 285   eGFR (African American) >60   eGFR (Non-African American) >60 (eGFR values <46mL/min/1.73 m2 may be an indication of chronic kidney disease (CKD). Calculated eGFR is useful in patients with stable renal function. The eGFR calculation will not be reliable in acutely ill patients when serum creatinine is changing rapidly. It is not useful in  patients on dialysis. The eGFR calculation may not be applicable to patients at the low and high extremes of body sizes, pregnant women, and vegetarians.)   Anion Gap 8  Urine Drugs:  03-UUE-28 00:34    Tricyclic Antidepressant, Ur Qual (comp) NEGATIVE (Result(s) reported on 11 Aug 2012 at 11:35PM.)   Amphetamines, Urine Qual. NEGATIVE   MDMA, Urine Qual. NEGATIVE   Cocaine Metabolite, Urine Qual. NEGATIVE   Opiate, Urine qual NEGATIVE   Phencyclidine, Urine Qual. NEGATIVE   Cannabinoid, Urine Qual. NEGATIVE   Barbiturates, Urine Qual. NEGATIVE   Benzodiazepine, Urine Qual. NEGATIVE (----------------- The URINE DRUG SCREEN provides only a preliminary, unconfirmed analytical test result and should not be used for non-medical  purposes.  Clinical consideration and professional judgment should be  applied to any positive drug screen result due to possible interfering substances.  A more specific alternate chemical method must be used in order to obtain a confirmed analytical result.  Gas chromatography/mass spectrometry (GC/MS) is the preferred confirmatory method.)   Methadone, Urine Qual. NEGATIVE  Cardiac:  17-Oct-13 06:09    Troponin I < 0.02 (0.00-0.05 0.05 ng/mL or less: NEGATIVE  Repeat testing in 3-6 hrs  if clinically indicated. >0.05 ng/mL: POTENTIAL  MYOCARDIAL INJURY. Repeat  testing  in 3-6 hrs if  clinically indicated. NOTE: An increase or decrease  of 30% or more on serial  testing suggests a  clinically important change)   CK, Total 44   CPK-MB, Serum 1.5 (Result(s) reported on 12 Aug 2012 at 06:56AM.)  Routine UA:  16-Oct-13 22:23    Color (UA) Yellow   Clarity (UA) Cloudy   Glucose (UA) >=500   Bilirubin (UA) Negative   Ketones (UA) Negative   Specific Gravity (UA) 1.008   Blood (UA) 2+   pH (UA) 6.0   Protein (UA) 100 mg/dL   Nitrite (UA) Negative   Leukocyte Esterase (UA) 3+ (Result(s) reported on 11 Aug 2012 at 11:35PM.)   RBC (UA) 6 /HPF  WBC (UA) 52 /HPF   Bacteria (UA) 1+   Epithelial Cells (UA) 17 /HPF   Mucous (UA) PRESENT   Budding Yeast (UA) PRESENT (Result(s) reported on 11 Aug 2012 at 11:35PM.)  Routine Hem:  16-Oct-13 22:23    WBC (CBC) 8.7   RBC (CBC) 3.99   Hemoglobin (CBC) 12.5   Hematocrit (CBC) 35.2   Platelet Count (CBC) 235 (Result(s) reported on 11 Aug 2012 at 10:53PM.)   MCV 88   MCH 31.3   MCHC 35.5   RDW 13.8   Radiology Results: CT:    16-Oct-13 23:27, CT Head Without Contrast   CT Head Without Contrast    REASON FOR EXAM:    headache  COMMENTS:       PROCEDURE: CT  - CT HEAD WITHOUT CONTRAST  - Aug 11 2012 11:27PM     RESULT: Emergent noncontrast CT of the brain is performed and compared to   the most recent exam dated 16 October 2011.    There is mild atrophy. Low-attenuation is seen diffusely in the   periventricular and subcortical white matter. There is no evolving   infarct, territorial infarct, mass or mass effect. There appear to be   some prominent perivascular spaces or tiny lacunar infarcts in the left   basal ganglia, unchanged compared to the previous study. The sinuses and   mastoid air cells demonstrate normal aeration. The calvarium appears   intact.  IMPRESSION:  Changes of atrophy appropriate for the patient's age with   probable microangiopathy. No acute intracranial abnormality evident.    Stable left basal ganglia presumed lacunar infarct.    Thank you for the opportunity to contribute to the care of your patient.     Dictation Site: 1          Verified By: Sundra Aland, M.D., MD   Radiology Impression:  Radiology Impression: CT of head personally reviewed by me and shows mild diffuse white matter changes    MRI of brain w/o contrast shows moderate periventricular white matter changes that could be either ischemic vs. demyelinating   Impression/Recommendations:  Recommendations:   case d/w referring physician records reviewed by me  reviewed and remarkable for UTI   Vertigo-  by history this sounds more peripheral but negative exam for this, exam is concerning for more of central process such as stroke or demyelination given the fact that she has diplopia and soft R sided dysmmetria.  There are more risk factors for stroke and we believe that this may be a posterior circulation infarct. Hypertension-  uncontrolled Diabetes-  uncontrolled  MRI of brain w/wo contrast MRA of head and neck check B12, Hem A1c, ESR, CRP restart ASA 81mg  daily continue statin meclizine 12.5mg  TID PO for dizziness PT/OT/ST evaluations goal Hem A1c < 7 goal BP after d/c < 130/80 will follow  Electronic Signatures: Jamison Neighbor (MD)  (Signed 17-Oct-13 11:30)  Authored: REFERRING PHYSICIAN, Primary Care Physician, Consult, History of Present Illness, Review of Systems, PAST MEDICAL/SURGICAL HISTORY, HOME MEDICATIONS, ALLERGIES, Social/Family History, NURSING VITAL SIGNS, Physical Exam-, LAB RESULTS, RADIOLOGY RESULTS, Recommendations   Last Updated: 17-Oct-13 11:30 by Jamison Neighbor (MD)

## 2015-02-13 NOTE — H&P (Signed)
PATIENT NAME:  Brenda Pope, Brenda Pope MR#:  [id number] DATE OF BIRTH:  1956-07-05  DATE OF ADMISSION:  08/12/2012  PRIMARY C[facility] PHYSICIAN: [facility]  ER PHYSICIAN: Dr. Marilynne HalstedBoland ADMITTING PHYSICIAN: Dr. Tilda FrancoAkuneme    PRESENTING COMPLAINT: Elevated blood pressure with right visual impairment.   HISTORY OF PRESENT ILLNESS: Patient is a 59 year old lady with history of hypertension and prior episodes of hypertensive urgency admission, transient ischemic attack and cerebrovascular accident with right parietal infarct about 12 months ago, now returns with complaint of elevated blood pressure with generalized malaise and visual impairment right eye. Symptoms started this morning, have been persistent all day long. Placed several calls to her primary cardiologist with no response and this evening spoke with the on-call cardiologist who recommended Emergency Room visit. Here in the ER she was noted to have elevated blood pressure systolic of 185 and was given hydralazine and blood pressure was down to 139. Patient was referred to hospitalist for further evaluation. CT head was nondiagnostic. Patient denies any chest pain. No PND, orthopnea, or pedal edema. Has had chronic fatigue since her last CVA about 12 months ago. No episode of palpitations. No recent change in medication, long distance travel or sick contact. She is complaining of blurred vision in right eye. Initially had no vision from the eye but now is currently blurred with recurrent episodes of diplopia or double vision. No slurred speech, facial droop, seizure-like activity or incontinence.   REVIEW OF SYSTEMS: CONSTITUTIONAL: Positive for fatigue which has been chronic. No weight loss or weight gain. EYES: Positive for double vision but no redness or discharge. ENT: No tinnitus, epistaxis, or difficulty swallowing. RESPIRATORY: Denies cough, shortness of breath. CARDIOVASCULAR: No chest pain, PND, palpitations, pedal edema. GASTROINTESTINAL: Has some  nausea but no vomiting or diarrhea, abdominal pain. No change in bowel habits. GENITOURINARY: Has some frequency but denies dysuria. ENDOCRINE: No polyuria, polydipsia, heat or cold intolerance. HEMATOLOGIC: No anemia, easy bruising, bleeding, or swollen glands. SKIN: No rashes, change in hair or skin texture. MUSCULOSKELETAL: Positive for generalized body aches but no joint redness or limited activity. NEUROLOGIC: No episodes of seizure, memory loss. Had some mild headache which is right sided. No numbness. Denies vertigo but has some blurred vision with diplopia both eyes. PSYCH: No anxiety or depression.   PAST MEDICAL HISTORY:  1. History of migraines. 2. Gastroesophageal reflux disease.   3. Hypertension with prior admissions for hypertensive urgency in the past.  4. Type 2 diabetes.  5. 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 6. Splenomegaly.  7. History of hyperlipidemia.   PAST SURGICAL HISTORY:  1. Appendectomy. 2. Cholecystectomy.   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 No alcohol, tobacco, or recreational drug use.   FAMILY HISTORY: Positive for hypertension.   ALLERGIES: Penicillin.   MEDICATIONS:  1. Atacand 8 mg twice daily.  2. Lantus sliding scale at bedtime.  3. NovoLog sliding scale a.c., h.s.  4. Phenergan 25 mg 3 times daily p.r.n. nausea, vomiting. 5. Crestor 20 mg daily at bedtime.  6. Metoprolol 50 mg twice a day. 7. Lasix 40 mg 2 tablets twice daily.  8. Omeprazole 40 mg daily.  9. Wellbutrin 75 mg once a day.  PHYSICAL EXAMINATION:  VITAL SIGNS: Temperature 99, pulse 55, respiratory rate 18, blood pressure on arrival 185/84 now is 139/60, oxygen saturation 99% on room air.   GENERAL: Middle-aged overweight lady  lying on the gurney, awake, alert, oriented to time, place, and person though sleepy but easily arousable, husband at  bedside, supportive.   HEENT: Atraumatic, normocephalic. Pupils equal, reactive to light and accommodation. Mucous membranes pink, moist.   NECK: Supple. No JV distention.   CHEST: Good air entry. Clear to auscultation.   HEART: Regular rate and rhythm   ABDOMEN: Full, moves with respiration, nontender. Bowel sounds normoactive. No organomegaly.   EXTREMITIES: No edema, clubbing, deformity.   NEUROLOGICAL: Cranial nerves II through XII grossly intact except for some visual impairment. Has some mild visual field cut on the right and diplopia. Sensory appears intact. Has pronator drift on the left with power 4/5 all limbs.   PSYCH: Affect flat.   LABORATORY, DIAGNOSTIC, AND RADIOLOGICAL DATA: EKG showed sinus bradycardia, rate of 54 with lateral T wave inversion in V5 and 6. CT head showed no acute intracranial abnormality. CBC unremarkable. Chemistry unremarkable except for potassium of 3.3, glucose 104, BNP 307, albumin 3.3. Normal LFTs. CK 55, troponin negative. Urine drug screen is negative. Urinalysis is positive for leukocyte esterase 3+, WBCs 52, 1+ bacteria and budding yeast cells.  IMPRESSION:  1. Hypertensive urgency, responded to hydralazine here in the Emergency Room.  2. Visual impairment query cause likely from hypertensive urgency, to rule out cerebrovascular accident.  3. Urinary tract infection with yeast infection too.  4. History of CVA with recurrent transient ischemic attacks.  5. Gastroesophageal reflux disease.  6. Type 2 diabetes, stable.    7. Hypokalemia likely from diuretic use.   PLAN: Admit to general medical floor under telemetry. Will add hydralazine to her blood pressure regimen. Continue home medication. For neurology consult in a.m. MRI in a.m. Antibiotics with ceftriaxone and fluconazole for urinary tract infection and yeast infection. Replace potassium as needed. Hold Lasix overnight. GI prophylaxis with Protonix. Deep vein thrombosis prophylaxis with  Lovenox. For seizure, fall, and aspiration precautions.   CODE STATUS: FULL CODE.   TOTAL PATIENT CARE TIME: 55 minutes.   ____________________________ Floy Sabina Tilda Franco, MD mia:cms D: 08/12/2012 03:02:20 ET T: 08/12/2012 08:44:00 ET JOB#: [id number]  cc: Siera Beyersdorf I. Tilda Franco, MD, <Dictator> [facility] Margaret Pyle MD ELECTRONICALLY SIGNED 08/13/2012 2:47

## 2015-02-13 NOTE — Consult Note (Signed)
PATIENT NAME:  Brenda Pope, Brenda Pope MR#:  [id number] DATE OF BIRTH:  [birth year]  DATE OF CONSULTATION:  08/29/2012  REFERRING PHYSICIAN:   CONSULTING PHYSICIAN:  Pauletta Browns, MD  REASON FOR CONSULTATION: Rule out new stroke. Information was obtained from the patient's husband, the patient's family members that were at bedside, and the patient herself.  HISTORY OF PRESENT ILLNESS: This is a 59 year old female with history of migraines, diabetes, normally controlled hypertension, congestive heart failure, history of prior transient ischemic attacks, and multiple strokes in the past with last one involving her mid brain causing her to have binocular diplopia presenting to [facility] for generalized weakness. As per the patient's husband, who states the patient has not been herself in the past few weeks, she has decreased energy, decreased appetite, decreased motivation, as well as multiple episodes of dizziness.  The patient denies any dysuria, diarrhea, fever, chills, chest pain, or shortness of breath. The patient's family states that she has seen numerous physicians in the past and state that physicians just change her from one medication to another and they seem to have trouble keeping the same physician. CAT scan of the head was obtained that showed no acute intracranial abnormality.   PAST MEDICAL HISTORY:  1. Multiple infarcts. 2. Hypertension. 3. Diabetes. 4. Congestive heart failure.  5. Migraines. 6. Splenomegaly. 7. Hyperlipidemia.   SOCIAL HISTORY: She lives with her husband but has issues with insurance and she is no longer employed because of the strokes.   FAMILY HISTORY: Significant for hypertension and hyperlipidemia.   ADMISSION MEDICATIONS: Wellbutrin, omeprazole, Lasix, metoprolol, Crestor, Phenergan, Humalog, Lantus, sliding scale, and Atacand.   PHYSICAL EXAMINATION:  VITALS: Temperature 98.2, pulse 82, respirations 19, blood pressure 188/83, and  saturation 95% on room air.  GENERAL: The patient is alert, awake, and oriented to person, place, time, and location and able to tell me the date, time, and president of the [place].  NEURO: On cranial nerve examination, there is evidence of right-sided exotropia. Her right eye is deviated outward compared to her left and she is unable to focus on the same image complaining of binocular diplopia. Face appears to be symmetrical. Hearing is intact. Sensation intact on the face. Palate elevates symmetrically. Tongue is midline. Shoulder strength is intact. Motor examination appears to be 5 out of 5 bilateral upper and lower extremities. Finger-to-nose coordination appears to be intact. Sensation is intact to light touch and temperature.   IMPRESSION AND RECOMMENDATIONS: This is a 59 year old Caucasian female with multiple medical problems presenting with generalized fatigue, nausea, vomiting, and binocular diplopia. I had at length discussion with the patient, her husband, and family members that were at bedside consequences of the stroke she had. Described that the left exotropia and binocular diplopia, which is double vision, the patient is complaining about is because of the stroke that probably affected the medial rectus of the right eye. That causes the right eye to be deviated compared to the left eye and both eyes do not focus on the same image. That can be alleviated by a patch or closing one eye and looking through the other eye. The patient has been explained the strict indications that she needs blood pressure control and importantly diabetic control. The patient states she checks her sugar three times a day and her morning glucose is always over 200.  The patient is complaining of generalized fatigue. I would follow that up with her endocrinologist and look at her thyroid profile. Regarding difficulty  with motivation, it appears the patient is depressed. She is on an antidepressant but she will  followup with a psychiatrist. At this time, on her examination, she has no focal abnormality that is acute. I do not think this is a stroke or any intracranial pathology and would not do any further imaging. The patient should followup with her primary doctor, cardiologist, neurologist, and endocrinologist as outpatient.         This was discussed with the patient's primary care team and the family who were at bedside. Thank you for asking me to see this patient.  ____________________________ Pauletta BrownsYuriy Quinlyn Tep, MD yz:slb D: 08/29/2012 15:22:21 ET T: 08/30/2012 09:44:28 ET JOB#: [id number]  cc: Pauletta BrownsYuriy Jhoselin Crume, MD, <Dictator> Pauletta BrownsYURIY Yakima Kreitzer MD ELECTRONICALLY SIGNED 10/12/2012 19:07

## 2015-02-13 NOTE — H&P (Signed)
PATIENT NAME:  Brenda Pope, Brenda Pope MR#:  [id number] DATE OF BIRTH:  04-03-1956  DATE OF ADMISSION:  08/27/2012  PRIMARY CARE PHYSICIAN: [facility].   CHIEF COMPLAINT: Headache, vomiting, uncontrolled high blood pressure.   HISTORY OF PRESENTING ILLNESS: A 59 year old Caucasian female patient with history of prior transient ischemic attacks and a recent pontine stroke two weeks back, presents to the emergency room complaining of elevated blood pressure into the 200s along with headache and vomiting. The patient also has blurred vision on the right side and left lower extremity weakness, which is from her prior stroke. The patient has been compliant with her medications, but could have had thrown up her medications for blood pressure with the vomiting. She does not have any dysuria, diarrhea, fever, chills, chest pain, shortness of breath. Headache is diffuse all over. CT scan of the head showed no acute strokes.   The patient's husband is frustrated and angry with the hospital and the medical care they have received. They were not called about cardiology and neurology followup appointments which they were promised. He also believes that lack of insurance has caused the lack of care at this hospital.   PAST MEDICAL HISTORY:  1. Acute cerebrovascular accident. 2. Hypertension. 3. Diabetes. 4. Diastolic congestive heart failure. 5. Migraines. 6. Splenomegaly.  7. Hyperlipidemia.   PAST SURGICAL HISTORY:  Appendectomy, cholecystectomy.   SOCIAL HISTORY: The patient used to work as an Advertising account planner in the past and since her last CVA has stopped working. Does not smoke, no alcohol, no illicit drugs.   FAMILY HISTORY: Positive for hypertension.   ALLERGIES: Penicillin.   MEDICATIONS:  1. Atacand 8 mg twice daily.  2. Lantus sliding scale.  3. NovoLog sliding scale.  4. Phenergan 25 mg 3 times a day as needed for nausea or vomiting.  5. Crestor 20 mg daily at bedtime.  6. Metoprolol 50 mg  twice daily.  7. Lasix 40 mg 2 tablets twice daily.  8. Omeprazole 40 mg daily.  9. Wellbutrin 75 mg once a day.   REVIEW OF SYSTEMS: CONSTITUTIONAL: Positive for fatigue. No weight loss. EYES: Blurred vision on the right side. No redness, watering. ENT: No tinnitus, epistaxis, difficulty swallowing. RESPIRATORY: Denies cough, shortness of breath. CARDIOVASCULAR: No chest pain, PND, orthopnea. GASTROINTESTINAL: Has nausea and vomiting. No abdominal pain, diarrhea, constipation. GENITOURINARY: No frequency, burning, dysuria. ENDOCRINE: No polyuria or polydipsia. Has diabetes. HEMATOLOGIC: No anemia, easy bruising, bleeding. SKIN: No rash, ulcers. MUSCULOSKELETAL: On and off generalized body aches. No joint swelling, redness. NEUROLOGIC: Has left lower extremity weakness, which is chronic. PSYCHIATRIC: No anxiety or depression.   PHYSICAL EXAMINATION:   VITAL SIGNS: Temperature 98, pulse of 75, blood pressure 214/87 and 215/84, saturating 98% on room air.   GENERAL: Obese, Caucasian female patient lying in bed, comfortable, conversational, cooperative with exam.   PSYCHIATRIC: Alert, oriented x3. Mood and affect appropriate. Judgment intact.   HEENT: Atraumatic, normocephalic. Oral mucosa moist and pink. External ears and nose normal. No pallor. No icterus. Pupils bilaterally equal and reactive to light.   NECK: Supple. No thyromegaly. No palpable lymph nodes. Trachea midline. No carotid bruit or JVD.   CARDIOVASCULAR: S1, S2, regular rate and rhythm without any murmurs. Peripheral pulses 2+. 1+ edema in the lower extremities.   RESPIRATORY: Normal work of breathing. Clear to auscultation on both sides.   GASTROINTESTINAL: Soft abdomen, nontender. Bowel sounds present. No hepatosplenomegaly palpable.   SKIN: Warm and dry. No petechiae, rash, ulcers.   MUSCULOSKELETAL:  No joint swelling, redness, or effusion of the large joints. Normal muscle tone.   NEUROLOGICAL: Motor strength 5/5 in  upper extremities. Lower extremities: Left lower extremity is 4/5. Sensation to fine touch intact all over. Cranial nerves II through XII intact.   LABORATORY STUDIES: Show a CBC and BMP normal except glucose is 282.   CT scan of the head shows no acute abnormalities.   ASSESSMENT AND PLAN:  1. Hypertensive urgency, likely secondary from her headache of migraine and throwing up of her pills. We will continue home medications, increase metoprolol from 50 to 100 twice a day. Slowly decrease the blood pressure, use IV hydralazine as needed to control blood pressure.  2. Headache and vomiting likely secondary from migraine on the hypertensive urgency. We will closely monitor the hypertensive urgency.  We will closely monitor.  This should improve with blood pressure coming down.  3. Recent cerebrovascular accident. The patient is on aspirin and Plavix which will be continued. Patient is also on a statin.  4. Diabetes mellitus. Continue sliding scale insulin, diabetic diet.  5. Deep vein thrombosis prophylaxis with Lovenox.         CODE STATUS: FULL CODE.   TIME SPENT: Time spent today on this case was 60 minutes with more than 50% time spent in coordination of care.    ____________________________ Molinda BailiffSrikar R. Tobie Perdue, MD srs:vtd D: 08/27/2012 21:51:50 ET T: 08/28/2012 07:17:52 ET JOB#: [id number]  cc: Wardell HeathSrikar R. [place]iah Trawick, MD, <Dictator> [facility] Rogelio [place] Bali Shakaya Bhullar MD ELECTRONICALLY SIGNED 09/06/2012 12:52

## 2015-02-16 NOTE — Consult Note (Signed)
PATIENT NAME:  Brenda Pope, SOLE MR#:  [id number] DATE OF BIRTH:  Jul 06, 1956  DATE OF CONSULTATION:  07/08/2013  REFERRING PHYSICIAN:   CONSULTING PHYSICIAN:  Cammy Copa, MD  PRIMARY CARE PHYSICIAN:  Dr. Bobbye Riggs.  REASON FOR CONSULTATION:  To evaluate the trach stoma.   HISTORY OF PRESENT ILLNESS:  The patient is a 59 year old white female with known history of seven strokes, most recent one being in July of 2014 with left-sided residual paralysis, hypertension, diabetes and history of epilepsy.  She was recently in the hospital because of nausea and vomiting and sent to a rehab center.  She was having some difficulty with shortness of breath.  She was brought to the Emergency Room for evaluation of her airway and PEG tube.  She has been on some tube feedings and these had been adjusted some.  She had problems with what looked like some mucus plugs in her trach tube.  The chest x-ray showed some dense interstitial markings, more on the right than the left and possible aspiration pneumonia.  She was started on IV medications.  She is stable and potentially ready to go back to a rehab facility.  Assessments made of her tracheal airway today to make sure there is no sign of infection, obstruction or challenges in the trachea.  She has been using a speaking valve and has been breathing well with that and a trach collar for high humidity.  She has been nothing by mouth,  but seems to be handling her secretions pretty well and was awaiting a swallowing study to be done as an outpatient.   PAST MEDICAL HISTORY:  Significant for hypertension, multiple strokes, last stroke in July of 2014, type 2 diabetes mellitus, migraine headaches, hyperlipidemia, history of epilepsy, chronic diastolic heart failure with an ejection fraction of 55%.   DRUG ALLERGIES:  PENICILLIN.   CURRENT MEDICATIONS:  As noted in the chart.    SOCIAL HISTORY:  She has been residing at home on-and-off and using physical rehab at  the [facility] in [place].  The patient is married and the husband is taking care of her.  No history of smoking, alcohol or illicit drugs.   FAMILY HISTORY:  Father died at age 32 from a heart attack.  Mother died in her 43s from complications of cancer.   PHYSICAL EXAMINATION:  HEENT:  The patient is awake and alert.  Her eyes are open, follows me well.  She responds appropriately.  The nose looks open.  Oropharynx does not show any oral lesions.  NECK:  Negative for any nodes or masses.  The trach tube is lying in a good position.  The stoma site is very clean and healthy.  No sign of any infection or inflammation here and the skin is healed around the trach tube.  The trach tube is lying in a good position.  A #6 Shiley cuffless trach tube.   Flexible tracheobronchoscopy is done for visualization of the tracheal bronchial tree through the trach tube.  This is dictated in detail elsewhere.  The exam showed a very healthy trachea.  The tube was lying in a good position in the trachea.  There is no granulation tissue or shelves.  No collapse of the posterior tracheal wall.  The carina is sharp.  The mainstem bronchi are clear.  There is no blood-staining anywhere.  There is no lesions seen in the trachea or in the mainstem bronchi on either side.  There is no secretions here  at all.   IMPRESSION AND RECOMMENDATIONS:  The patient has had a trach tube now for the last 6 to 7 weeks.  It has been very stable.  It is sitting in a good position.  The trachea is very healthy.  She is using a speaking valve easily and getting some high humidity and she can continue with this.  She needs a swallowing study to evaluate her larynx and swallowing to make sure that the swelling is competent.  Once this is completed, then she can have her trach tube downsized and potentially undergo capping trials to consider decannulation.  She is tentatively scheduled to be discharged tomorrow, but may be able to stay until  Monday and get a modified barium swallow done by speech therapy on Monday and see if she is allowed to have some pleasure feedings of any consistencies that may allow her to proceed on towards capping trials.  I have explained to them where my office is, that they can come for further evaluation and work towards decannulation in the future, but not until she has been shown to have a competent larynx to be able to handle all her secretions and protect her airway.    ____________________________ Cammy CopaPaul H. Ireoluwa Gorsline, MD phj:ea D: 07/08/2013 19:45:03 ET T: 07/08/2013 22:44:54 ET JOB#: [id number]  cc: Cammy CopaPaul H. Shelsie Tijerino, MD, <Dictator> Cammy CopaPAUL H Kishana Battey MD ELECTRONICALLY SIGNED 07/09/2013 20:38

## 2015-02-16 NOTE — Discharge Summary (Signed)
Brenda Pope NAME:  Brenda Pope, Brenda Pope MR#:  [id number] DATE OF BIRTH:  Mar 09, 1956  STAT DISCHARGE SUMMARY  DATE OF ADMISSION:  04/30/2013 DATE OF TRANSFER:  05/02/2013  ADMITTING PHYSICIAN:  Dr. Rudene Rearwish. TRANSFERRING PHYSICIAN:  Enid Baasadhika Ruford Dudzinski.  [facility]   ICU ATTENDING: Dr. Blanchard KelchAlvap, and neurohospitalist Dr. Noel Christmasharles Stewart, who are aware of the Brenda Pope.   Brenda Pope'S PRIMARY CARE MD: At [facility].   CONSULTATIONS HERE IN THE HOSPITAL:  1.  Neurology consultation by Dr. Loretha BrasilZeylikman.  2.  Nephrology consultation, by Dr. Daiva NakayamaMansoor Lateef.  3.  Pulmonary critical care consultation by Dr. Freda MunroSaadat Khan, for vent management.   DIAGNOSES AT THE TIME OF TRANSFER:  1.   Acute cerebrovascular accident with left-sided weakness.  2.  Status epilepticus.  3.  Acute respiratory failure from aspiration pneumonia, intubated currently on ventilator on AC mode back-up rate of 14, tidal volume of 500, PEEP of 5, and FiO2 of 30%.  4.  Acute-on-chronic diastolic congestive heart failure. Most recent echocardiogram with ejection fraction of 55%.  5.  Acute renal failure.  6.  Uncontrolled diabetes mellitus.  7.  Hypertension.  8.  Hypotensive today.  9.  Hypokalemia.  10.  Prior history of strokes in the past. No prior seizures. She had 3 strokes in the past, with no residual neurological deficits other than some memory gaps, according to husband. 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   CURRENT MEDICATIONS: Continuous drips including.   1.  Insulin drip, per Critical Care Unit protocol for hyperglycemia and Brenda Pope on ventilator.  2.  Propofol drip, currently at 2 mg/kg per hour.  3.  Levophed drip at 4 mcg per hour.  4.  Sodium chloride with potassium chloride, 40 mEq at 75 mL per hour.  5.   Plavix 75 mg p.o. daily.  6.  Hydralazine 20 mg IV push q.4h. p.r.n. for systolic blood pressure greater than 200.  7.  Labetalol 10 mg IV push q.4h. p.r.n. for systolic blood pressure greater than 200 or diastolic blood pressure greater than 120.  8.  Reglan 5 mg IV push q.6h. p.r.n. for nausea and vomiting.  9.  Tylenol suppository 650 mg q.4h. p.r.n. for pain or fever.  10.  Levaquin 750 mg IV q.24h.  11.  Meropenem 1 gram IV q.8h.  12. Lovenox 30 mg subcutaneous q.24h.  13. Zantac 150 mg per gastric tube at bedtime.  14.  Fosphenytoin 100 mg IV q.8h. (Keppra was changed over to fosphenytoin today due to worsening renal function).   LABS AND IMAGING STUDIES: Prior to transfer include WBC of 16.6, hemoglobin 11.3, hematocrit 33.6, platelet count 228.   Sodium 144, potassium 3.6, chloride 105, bicarbonate 31, BUN 35, creatinine 3.12, glucose 195 and calcium of 8.5.   Her ABG showing PH of 7.44, pCO2 43, pO2 133, bicarbonate 29.2, and sats of 99% on  40% FiO2.   Chest x-ray from this morning showing endotracheal tube present at the level of sternoclavicular joint. Nasogastric passes below diaphragm. Opacity in the left lung base giving improved aeration of the right hemithorax. Prominent interstitial markings at the lung bases noted.   Her creatinine from 05/01/2013 shows 1.66 and BUN was 20, with WBC count of 22.5. Blood culture so far since admission have remained negative. Ultrasound, carotid  Doppler, showing no evidence of any hemodynamically significant stenosis in extracranial arteries.   MRI of the brain showing findings concerning for small acute infarcts in right parietal lobe, and there are foci of increased diffusion signal at the left basal ganglia and central pons; could be artifact versus small infarcts.  Echo Doppler: Transthoracic echo showing n LV ejection fraction, EF of 55% to 60%. No source of CVA identified. Normal left and right ventricular size and systolic function,  mild mitral valve regurgitation, and mild tricuspid regurgitation and normal right ventricular systolic pressures.   Urinalysis revealing 3+ leuk esterase, and WBCs greater than 600, and few bacteria. Cultures have remained negative.   BRIEF HOSPITAL COURSE: Brenda Pope is a 59 year old Caucasian female with a past medical history significant for hypertension, diabetes, diastolic congestive heart failure and a prior history of strokes with no residual neurological deficits except minimal memory gaps, was brought in secondary to a 2-week history of congestion and also worsening shortness of breath. The Brenda Pope was seen by her PCP and has been on doxycycline for an upper respiratory tract infection, but has been worsening pedal edema, orthopnea, and exertional dyspnea, so she was admitted initially for acute-on-chronic diastolic CHF exacerbation.   Post-one day into admission on July 5th evening she had an episode where she was having left-sided weakness and had a CT of the head ordered which was negative for any acute infarct, however because of persistent concerns for a stroke teleneurology was consulted and the Brenda Pope had been put on stroke protocol and had MRI carotid Dopplers and echo ordered. There was a question concern that the husband mentioned the Brenda Pope has had an hemorrhagic infarct and hemorrhagic stroke in the past, so t-PA was not ordered and the Brenda Pope's aspirin was changed over to Plavix. On 05/01/2013 in the afternoon around 1:30 it was noted the Brenda Pope was having a status epilepticus episode, and the neurologist was present at the time. She was having significant nystagmus, eye-rolling, was unable to protect her airway, had an episode of huge vomiting which resulted in possible aspiration. The Brenda Pope was moved over to CCU, intubated, and has been on a vent since then.   1.  Acute respiratory failure, secondary to possible aspiration pneumonia and airway protection from seizures: The  Brenda Pope has been intubated and has been on ventilators since 05/01/2013. She is on minimal vent settings, PEEP of 5 and FIO2 of 30%, tidal volume 500, and backup rate of 14 at this time. She is on [facility] mode. She is on Levaquin and meropenem for broadening her coverage at this time.  Critical care attending has seen the Brenda Pope while in the ICU.  2.  Acute CVA. with MRI showing right parietal infarct, questionable embolic in nature  based on the tiny diversified infarcts, however, echo did not reveal any cardiac source of emboli. She is not in stable condition to get a TEE. The Brenda Pope has had prior MRIs done in 2012, 2013 for her prior stroke, at which time she had a right mid-brain infarct and a previous left basal ganglia lacunar infarct. She had MRA of the brain done which showed a multifocal significant stenosis of posterior cerebral artery, but MRA of the neck revealed normal vertebrals and also normal carotid arteries. She has left vertebral dominance and a small right vertebral artery. She is being followed by a neurologist here and, her aspirin has been changed over to Plavix, and because of the concerns for previous hemorrhagic infarct, which is still doubtful  the Brenda Pope was not given  t-PA here in the hospital. Her carotid Dopplers at this time are negative, and she will need ongoing neurological workup as demanded by her clinical condition.  3.  Status epilepticus: No prior episode of seizures. This could be related to her underlying stroke.  4.  Acute renal failure: Her creatinine has increased from 1.6 to 2.3 today.  5.  Possible acute tubular necrosis: She has received Lasix for diuresis on admission, and also the Keppra could have caused this acute change in renal function per the neurologist. She is pending to have a nephrology consult and also a renal ultrasound done, which will be followed up at [facility].  6.  Diabetes mellitus: She has been hyperglycemic and has been on the CCU insulin drip  protocol.  7.  Acute-on-chronic diastolic congestive heart failure exacerbation: She was diuresed earlier in the admission. Her chest x-ray does show some basilar fluid accumulation, however lungs are otherwise clear. She has been on the vent, and Lasix has been on hold due to renal function worsening and also hypotension.  8.  History of hypertension, with current hypotension: Her spironolactone, Atacand, and metoprolol gave been held since admission due to her acute cerebrovascular accident, and her now her blood pressure is dropping after the fosphenytoin. She has minimally responded to fluid bolus and is being started on a Levophed drip at four mcg currently, with MAP greater than  80  this time. Permissive hypertension is low in the setting of her acute CVA.  9.  Hypokalemia: Her electrolytes are being replaced appropriately.  10. Gastrointestinal and deep venous thrombosis prophylaxis: The Brenda Pope has been on Protonix, Zantac and Lovenox for GI and DVT prophylaxis here in the hospital.   CODE STATUS: FULL CODE.   Family, Brenda Pope's husband and daughter have been updated at bedside, and Dr. Loretha Brasil was also updated on the transfer.   Transfer accepting facility is [facility].   Neurohospitalist is Dr. Roseanne Reno, and ICU attending accepting the transfer is Dr. Blanchard Kelch.  Additional time spent is 35 minutes   ____________________________ Enid Baas, MD rk:dm D: 05/02/2013 14:32:17 ET T: 05/02/2013 15:30:35 ET JOB#: [id number]  cc: Enid Baas, MD, <Dictator> Enid Baas MD ELECTRONICALLY SIGNED 05/23/2013 13:23

## 2015-02-16 NOTE — H&P (Signed)
PATIENT NAME:  Brenda Pope, Brenda Pope MR#:  [id number] DATE OF BIRTH:  November 03, 1955  DATE OF ADMISSION:  06/24/2013  PRIMARY CARE PHYSICIAN:  Dr. Lorin Picket at [facility]   CARDIOLOGIST:  Dr. Pernell Dupre at [facility]  REQUESTING PHYSICIAN:  Dr. Minna Antis  CHIEF COMPLAINT:  Nausea and vomiting.   HISTORY OF PRESENT ILLNESS: The patient is a 59 year old female with a known history of multiple strokes, with recent being in July 2014, with left-sided residual paralysis, hypertension, diabetes. She is being admitted for pneumonia. The patient has a tracheostomy and PEG tube from recent CVA, and has been staying home, cared by her spouse. She started having nausea and vomiting for the last 3 to 4 days, associated with some cough and sputum, yellowish to whitish for the last 3 to 4 days. Has been getting worse with outpatient plugging of her tracheostomy due to secretions, and was unable to manage at home and decided to come to the Emergency Department. While in the ED, she was found to have left lower lobe density, worrisome for pneumonia, and she is being admitted for further evaluation and management.   PAST MEDICAL HISTORY: 1. Chronic diastolic heart failure, with EF of 55%.  2. Hypertension.  3. History of multiple strokes. 4. Type 2 diabetes.  5. Migraine headaches.  6. Hyperlipidemia.  7. History of epilepsy.   PAST SURGICAL HISTORY: 1. Cholecystectomy. 2.  Appendectomy. 3.  Exploratory surgery for adhesions.   FAMILY HISTORY: Father died in 71s from a heart attack. Mother died in 46s from complication of vaginal cancer.  SOCIAL HISTORY: No smoking. No alcohol or drug use. She is married, lives with her husband, who takes care of her. 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    REVIEW OF SYSTEMS:  CONSTITUTIONAL:  No fever, fatigue, weakness.  EYES: No blurry or double vision.  EARS, NOSE, THROAT:  No tinnitus or ear pain. She does have a tracheostomy in place.  RESPIRATORY:   Positive for cough and sputum, some shortness of breath.  CARDIOVASCULAR:  No chest pain, orthopnea, edema.  GASTROINTESTINAL: Positive for nausea and vomiting. No diarrhea. GENITOURINARY:  No dysuria or hematuria. ENDOCRINE:  No polyuria or nocturia.   HEMATOLOGY:  Positive for anemia, likely of chronic disease.  SKIN:  No rash or lesion.  MUSCULOSKELETAL:  No arthritis or muscle cramp.  NEUROLOGIC:  History of multiple strokes, with left-sided residual paralysis. She has a trach and PEG. PSYCHIATRIC:  No history of anxiety or depression.   PHYSICAL EXAMINATION: VITAL SIGNS:  Temperature 98.1, heart rate 76 per minute, respirations 20 per minute, blood pressure 193/74 mmHg, she is saturating 100% on 32% trach collar.  GENERAL:  The patient is a 59 year old female lying in bed in no acute respiratory distress.  EYES: Pupils equal, round, react to light and accommodation. No scleral icterus. Extraocular muscles intact.   HEENT:  Head atraumatic, normocephalic. Oropharynx and nasopharynx clear. NECK:  Supple. No jugular venous distention. No thyroid enlargement or tenderness. She does have a tracheostomy in place and seemed to have a lot of secretions there, which have been suctioned earlier. LUNGS: Clear to auscultation, except minimal rhonchi at the left lower lobe. Decreased breath sounds overall. No accessory muscles of respiration used.  CARDIOVASCULAR:  S1, S2 normal. No murmur, rubs or gallop.  ABDOMEN:  Soft, nontender, nondistended. Bowel sounds present. No organomegaly or masses. She does have a PEG tube in place. No signs of infection. The tube seemed  to be working fine, as per patient.  SKIN:  No obvious rash, lesion, ulcer.  MUSCULOSKELETAL:  No joint effusion or swelling. No clubbing or cyanosis.  NEUROLOGIC: Cranial nerves II through XII intact. She has left-sided residual paralysis from previous stroke. She does have some nystagmus. Her sensation seems intact. Reflexes are  diminished throughout. Coordination could not be checked. Gait not checked.  PSYCHIATRIC:  The patient seems alert and oriented.   LABORATORY PANEL:  CBC showed a white count of 5.3, hemoglobin 8.4, hematocrit 24.4, platelets 265. Normal first set of cardiac enzymes. Liver function tests showed alkaline phosphatase of 204, AST of 45. BMP showed sodium 133, potassium 5.0, chloride 98, CO2 of 33, BUN 17, creatinine 0.48, blood sugar 207. UA showed 3+ bacteria, 17 WBCs, 1+ leuk esterase.   Chest X-ray in the ED showed left lower lobe pneumonia, possible bilateral pulmonary edema.   IMPRESSION AND PLAN: 1.  New left lower lobe pneumonia. Certainly could be aspiration. Rule out with blood and sputum culture. Consult Pulmonary. Will continue oxygen via trach collar. She is saturating 100% at this time. Will start her on IV meropenem and Levaquin, considering her penicillin allergy and recent hospitalization to cover Pseudomonas.   2.  Hypertension. Will resume her blood pressure medication from home, and adjust as needed.   3. Nausea and vomiting. Could be possibly secondary to pneumonia. Cannot rule out viral gastroenteritis. Will provide symptomatic management for this time.   4.  History of multiple CVAs, with residual left-sided paralysis. She does have a trach and PEG. Will consult Dietary for tube feeding. PEG tube seems to be working fine.   5.  History of seizure. Will continue anti-seizure medication at this time. She has not had any more seizures since her last discharge.   6.  Diabetes. Will place on sliding scale insulin at this time.   7.  Code status:  FULL CODE.   Total time taking care of this patient is 55 minutes.      ____________________________ Ellamae SiaVipul S. Sherryll BurgerShah, MD vss:mr D: 06/24/2013 21:08:23 ET T: 06/24/2013 21:51:48 ET JOB#: [id number]  cc: Gedalia Mcmillon S. Sherryll BurgerShah, MD, <Dictator> Outside Physician  Ellamae SiaVIPUL S [facility] MD ELECTRONICALLY SIGNED 06/25/2013 18:15

## 2015-02-16 NOTE — Consult Note (Signed)
Pt CC: vomiting.  Pt tol tube feedings well. ENT note noted. I will sign off, reconsult if needed.  Electronic Signatures: Scot JunElliott, Robert T (MD)  (Signed on 13-Sep-14 15:34)  Authored  Last Updated: 13-Sep-14 15:34 by Scot JunElliott, Robert T (MD)

## 2015-02-16 NOTE — Discharge Summary (Signed)
PATIENT NAME:  Brenda Pope, Acire E MR#:  [id number] DATE OF BIRTH:  02/18/1956  DATE OF ADMISSION:  07/04/2013 DATE OF DISCHARGE:  07/12/2013  For a detailed note, please look at the history and physical done on admission by Dr. Amado CoeGouru.    DIAGNOSES AT DISCHARGE: 1.  Acute respiratory failure secondary to aspiration pneumonitis and mucous plugging of the tracheostomy.  2.  Aspiration pneumonitis now treated with 7 days of Levaquin.  3.  Nausea, vomiting secondary to bolus feeding via G-tube, not tolerant to continuous feeding. 4.  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  5.  History of seizures.  6.  Anemia.  7.  Labile hypertension.   DIET: The patient is being discharged on continuous tube feedings at 50 mL an hour. This is Glucerna The patient needs further speech therapy while at the skilled nursing facility. Follow-up is with Dr. [name] in the next 2 to 3 weeks.   DISCHARGE MEDICATIONS:  Tylenol 500 mg via G-tube q.4 hours as needed, Reglan 10 mL  t.i.d. before meals, irbesartan 150 mg b.i.d., Lovenox 40 mg subcutaneous daily, Keppra 1000 mg b.i.d., lacosamide 200 mg b.i.d., Dilantin 5 mL t.i.d., DuoNebs 2 to 4 times daily, hydrochlorothiazide 25 mg daily, sucralfate 1 gram q.i.d., promethazine 5 mL q.i.d., Glucerna 1.5 cal 50 mL per hour continuous feeding, iron sulfate 220 mg/5 mL b.i.d., metoprolol tartrate 50 mg t.i.d., amlodipine 10 mg daily, hydralazine 25 mg t.i.d. and aspirin 324 mg via G-tube daily.   CONSULTANTS DURING HOSPITAL COURSE: Dr. Elenore RotaJuengel from ENT, Dr. Lynnae Prudeobert Elliott from gastroenterology, Dr. Meredeth IdeFleming from pulmonary critical care, Dr. Gwen PoundsKowalski from cardiology.    PERTINENT STUDIES DONE DURING THE HOSPITAL COURSE: Are as follows: A chest x-ray done on 09/08 showing no evidence of acute cardiopulmonary disease. Repeat chest x-ray done on 09/09 showing slightly increased interstitial markings in the right lung. An ultrasound of the  abdomen done on 09/08 showing unremarkable right upper quadrant abdominal ultrasound.   HOSPITAL COURSE: This is a [age] year old female with medical problems as mentioned above, presented to the hospital on 07/04/2013 secondary to nausea, vomiting and shortness of breath and acute hypoxic respiratory failure.   PROBLEMS:  1.  Acute on chronic respiratory failure. This was likely secondary to mucous plugging from her secretions with possible underlying aspiration pneumonitis. The patient was empirically started on Levaquin. She has been treated for 7 days with Levaquin. Her sputum cultures grew out group B streptococcus, which is sensitive to Levaquin presently. The patient currently is on room air. O2 sats are stable with no further respiratory issues. She likely needs ongoing tracheostomy care and frequent suctioning when she is at the skilled nursing facility. The patient was evaluated by ENT to see if her trach could be downgraded or removed. This further needs to be assessed while she is at a skilled nursing facility. The patient underwent a modified barium swallow done by speech therapy here. The patient did well with it, although she is not ready to start eating all of her meals by mouth.  The patient likely needs to have further speech evaluation done and likely can be started on some oral intake within the next few days or so. For now, her primary source of nutrition should be her continuous tube feedings.   2.  Nausea and vomiting. This was likely secondary to the fact that the patient was getting bolus feeds through her PEG tube along with the free water flushes. The volume was a  bit high. Therefore, she was vomiting. The patient was seen by dietary here in the hospital and started on continuous feeds at 50 mL an hour which she has tolerated very well without any residuals or any further nausea or vomiting. She was also started on Reglan and she will continue that at the skilled nursing facility.    3.  Labile hypertension. The patient's blood pressure medications had to be adjusted while in the hospital. Presently, she is being discharged on metoprolol, hydrochlorothiazide, Norvasc,  hydralazine and this further needs to be titrated as her blood pressures have been somewhat labile.   4.  History of previous cerebrovascular accident, resulting in quadriplegia. The patient will continue aspirin as stated.   5.  History of seizures. The patient had no acute seizure-type activity. She will continue her Vimpat,  Dilantin and Keppra as stated.   6.  Diabetes. The patient had no severe hypoglycemic episodes. She was maintained on some sliding scale insulin while in the hospital. She will resume that at the skilled nursing facility.   CODE STATUS:  The patient is a FULL CODE.   She is being discharged to a skilled nursing facility for ongoing rehab. She will need further speech therapy assessment and also for the titration to antihypertensives at the facility.   Time spent on discharge is 40 minutes.    ____________________________ Rolly Pancake. Cherlynn Kaiser, MD vjs:dp D: 07/12/2013 14:30:00 ET T: 07/12/2013 15:02:04 ET JOB#: [id number]  cc: Bobbye Riggs, FNP Rolly Pancake. Cherlynn Kaiser, MD, <Dictator>  Houston Siren MD ELECTRONICALLY SIGNED 07/14/2013 17:25

## 2015-02-16 NOTE — Consult Note (Signed)
CC: vomiting after tube feeding.  Pt with admission problem of vomiting.  Had tube placed in August.  Regimen sounds like too much volume and too much water.  Discussed with husband.  Will get dietician to reevaluate for a better plan.  Electronic Signatures: Scot JunElliott, Kiaya Haliburton T (MD)  (Signed on 10-Sep-14 09:06)  Authored  Last Updated: 10-Sep-14 09:06 by Scot JunElliott, Rayana Geurin T (MD)

## 2015-02-16 NOTE — Consult Note (Signed)
PATIENT NAME:  Brenda Pope, BLAS MR#:  [id number] DATE OF BIRTH:  03-14-1956  DATE OF ADMISSION:  06/24/2013 DATE OF CONSULTATION:  06/27/2013  CONSULTING PHYSICIAN:  Lupita Dawn. Andres Bantz, MD  REASON FOR REFERRAL: Nausea and vomiting.   DESCRIPTION: The patient is a 59 year old white female with a history of strokes with the most recent being in July 2014. She is quadriplegic. She had a PEG tube, as well as a tracheostomy placed somewhat else. She is taken care of by her spouse. She was brought in because she was having bouts of nausea and vomiting for 3 to 4 days prior to admission. She was also having some coughing as well. There were increasing secretions from her trach. Because of inability to manage the patient at home, she was brought into the Emergency Room and then admitted. Initial eval has suggested she had pneumonia, but it appears to be more congestive heart failure and possibly some aspiration. In the hospital for last couple of days, she was still having vomiting every time she was given tube feeding through the G-tube. However, the patient was started on some Phenergan as well as Zofran. Finally, low-dose Reglan 5 mg was added a day ago. With the combination of medicines, the patient's nausea has finally subsided to the point that she can resume her tube feedings.   I was not able to obtain much history because she is rather somnolent. She is lying in her bed.   PAST MEDICAL HISTORY: Notable for congestive heart failure. She also has diabetes and headaches and epilepsy.   PAST SURGICAL HISTORY: Includes cholecystectomy and appendectomy surgery.   FAMILY HISTORY: Notable for heart disease and vaginal cancer.   SOCIAL HISTORY: Shows no smoking or alcohol use.   REVIEW OF SYMPTOMS: I can obtain a review of symptoms from the patient today, but please refer to the initial review of symptoms that was dictated on the 29th when she was admitted.   PHYSICAL EXAMINATION: GENERAL: The patient is  afebrile. She appears to be in no acute distress. She has a trach collar on with oxygen attached to it. Again, she is rather somnolent.  I was not able to get much history.   HEAD AND NECK: Within normal limits.  CARDIAC: Revealed regular rhythm and rate.  LUNGS: Clear bilaterally.  ABDOMEN:  Showed a somewhat distended abdomen. There is a trach in place. The site looks intact. There is no erythema or tenderness anywhere. There is no hepatomegaly. She has active bowel sounds. The G-tube appears to be flushing well without issues. EXTREMITIES:  Show no clubbing, cyanosis or edema.  NEUROLOGIC: She has got residual paralysis on the left side from her strokes.  SKIN: Otherwise negative.   LABORATORY  DATA:  Most recent showed a sodium of 135, potassium 4.1, chloride 105, BUN 22, glucose 205. Creatinine is 0.67. Liver enzymes are normal except for alk phos that was 190 on August 30th.  White count 6.6, hemoglobin 8.7. The sputum does show some gram-positive cocci and gram-positive rods in the Gram stain. Urinalysis is negative.   ASSESSMENT AND PLAN: This is a patient with a history of stroke who has a feeding tube in. The patient was having lots of nausea and vomiting, but it appears to have improved with the combination of Phenergan and low-dose Reglan, in addition to Zofran. The feeding tube  appears to be working fine. I recommended those medicines be given prior to each feeding. I also recommend that she does not get any  tube feedings late at night when she is lying down and sleeping to prevent increasing reflux. Side port of the G-tube can be used for feeding as well as checking for residuals. I will sign off as long as the patient is stable. Please call us back if there are any questions or issues in the near future.   Thank you for the referral.    ____________________________ Lupita Dawn. Candace Cruise, MD pyo:dmm D: 06/27/2013 12:28:30 ET T: 06/27/2013 12:37:40 ET JOB#: [id number]  cc: Lupita Dawn. Candace Cruise, MD,  <Dictator> Lupita Dawn Wrigley Plasencia MD ELECTRONICALLY SIGNED 06/29/2013 8:36

## 2015-02-16 NOTE — Consult Note (Signed)
PATIENT NAME:  Brenda SarnaFRICK, Elwyn E MR#:  [id number] DATE OF BIRTH:  Mar 23, 1956  DATE OF CONSULTATION:  05/01/2013  CONSULTING PHYSICIAN:  Pauletta BrownsYuriy Krystal Teachey, MD  REASON FOR CONSULTATION: Altered mental status, left-sided weakness; rule out stroke.   HISTORY OF PRESENTING ILLNESS: This is a 59 year old female with past medical history of chronic diastolic congestive heart failure, severe systemic hypertension, diabetes, chronic diabetic neuropathy; admitted for suspicion of history of exacerbation. The patient was in good mental status until about 2:30 p.m. yesterday. She was noted to have slurring of her words after which she was noted to have difficulty moving her left upper and left lower extremity. This morning, the patient appears to be more somnolent and not following commands. Status post CT of the head yesterday  did not show any acute intracranial abnormalities. Status post MRI of the brain today that showed small right parietal lobe infarcts.   REVIEW OF SYSTEMS:  Unable to obtain this patient, the patient does not follow any commands.   PAST MEDICAL HISTORY: Significant for history of diastolic congestive heart, ejection fraction 55%, severe systemic hypertension, history of stroke in 2012, which was hemorrhagic. No residual deficits from that. 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 The patient has history of migraine headaches, hyperlipidemia and splenomegaly.   PAST SURGICAL HISTORY: Cholecystectomy, appendectomy.   FAMILY HISTORY: Father died age of 59. Her mother died in the 8450s from vaginal complication and father died from a heart attack.   SOCIAL HISTORY: Nonsmoker, no history of alcohol or drug abuse.   SOCIAL HISTORY: She is married and lives with her husband.   HOME MEDICATIONS: Metoprolol, Lasix, Lantus NovoLog, Aldactone, Flonase, doxycycline, BuSpar and aspirin 81 mg.   PHYSICAL EXAMINATION: VITAL SIGNS: This patient's temperature is elevated at 101.7,  pulse is 97, blood pressure is 153/75, pulse oximetry is 92%.  NEUROLOGICAL: The patient somnolent, does not follow any commands. On cranial nerve examination, the patient appears to be having nystagmus with eyes movement from right to left does not respond to visual threats. Pupils are 3 mm to 2 mm, reactive. Facial sensation difficult to assess. On motor strength examination, the patient withdraws from painful stimuli; right-sided more than the left side. Reflexes are diminished throughout. Sensation could not be assessed. Coordination could not be assessed. Gait could not be assessed.   LABORATORY DATA, DIAGNOSTIC AND RADIOLOGIC DATA:  On laboratory work-up, her BUN is 20, creatinine is 1.66, sodium is 136, potassium 3.3. The patient's white count is elevated at 22.5  CAT scan of the head did not show any acute intracranial pathology.   MRI of the brain: Small right parietal infarcts.   IMPRESSION: A 59 year old female admitted  with suspicion of diastolic heart failure, new onset of sudden of left upper and left lower extremity weakness, confusion altered mental status with nausea, vomiting, possibility of aspiration.   PLAN: At this point, would transfer the patient to Critical Care Unit and intubate her for airway protection,  start her on vancomycin and Zosyn for suspicion of aspiration pneumonia as her white count is elevated. I do not believe the current strokes explain the whole picture of her complete left-sided plegia. On examination, she has roving eye movements and nystagmus from left to right.  Suspect the patient is actively seizing. When admitting to Intensive Care Unit, for seizure prophylaxis after intubation, would start patient on propofol drip, titrate it up as a much blood pressure is permitting. We will try to keep systolic blood  pressure below 180 and MAPs over 65 if possible. Keppra 1.5 grams loading and 1 gram twice a day now, would obtain an EEG in the morning, if possible.  Antibiotics, vancomycin and Zosyn as described above. This patient was discussed with primary team in Intensive Care Unit.    ____________________________ Pauletta Browns, MD yz:cc D: 05/01/2013 13:20:50 ET T: 05/01/2013 14:46:02 ET JOB#: [id number]  cc: Pauletta Browns, MD, <Dictator> Pauletta Browns MD ELECTRONICALLY SIGNED 05/03/2013 12:46

## 2015-02-16 NOTE — Consult Note (Signed)
PATIENT NAME:  Brenda Pope, Brenda Pope MR#:  [id number] DATE OF BIRTH:  05/08/1956  DATE OF CONSULTATION:  05/02/2013  REFERRING PHYSICIAN:  Gladstone Lighter, MD CONSULTING PHYSICIAN:  Tishanna Dunford Lilian Kapur, MD  REASON FOR CONSULTATION: Acute renal failure.   HISTORY OF PRESENT ILLNESS: The patient is a 59 year old Caucasian female with past medical history of chronic diastolic heart failure, hypertension, history of multiple CVAs, diabetes mellitus type 2, migraine headaches, hyperlipidemia, splenomegaly, who presented to [facility] on 04/30/2013 with increasing shortness of breath. The patient is unable to offer any history at this point in time, as she is currently intubated. The patient's husband is currently at the bedside and offers history. The patient's husband relates that the patient has had a number of CVAs in the past. She has been on disability for the same. She recently developed an acute bout of sinusitis per husband's report. The patient was having some bleeding from the ear as well as facial pressure and congestion. She went to see her primary care physician for this at [facility] and was prescribed an antibiotic. In addition, she was having increasing shortness of breath while at home. Shortness of breath worsened and she came in to the hospital for admission at that point in time. She was started on furosemide at a higher dose after arrival here. The patient subsequently had deterioration of her condition yesterday. She had an MRI of the brain performed yesterday, which showed findings concerning for small acute infarcts in the right parietal lobe. She was also apparently having seizure activity. She was evaluated by neurology thereafter. They have recommended transfer to a tertiary care center for additional care. We are asked to see the patient for evaluation and management of acute renal failure.   The patient's creatinine is currently 3.12. Her baseline creatinine appears  to be 1.2 with an eGFR of 49. As above, she has long-standing history of diabetes mellitus. It also appears that she has proteinuria, as most recent urinalysis showed urine protein greater than 500 mg/dL. She was also found to have hematuria and pyuria on urinalysis from this admission. There have been periods of hypotension today. Early this morning at 3:00 a.m., blood pressure was 99/49. Upon admission, blood pressure was quite high and as high as 240/115. Changes in renal perfusion pressure may now be responsible for acute renal failure.   PAST MEDICAL HISTORY: 1.  Chronic diastolic heart failure.  2.  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  3.  Multiple CVAs.  4.  Diabetes mellitus type 2.  5.  Migraine headaches.  6.  Hyperlipidemia.  7.  Splenomegaly.  8.  Cholecystectomy.  9.  Appendectomy.  10.  History of exploratory laparotomy for adhesions.   ALLERGIES: PENICILLIN.   CURRENT INPATIENT MEDICATIONS: Include:  1.  Propofol drip. 2.  Insulin drip. 3.  0.9 normal saline at 75 mL/h. 4.  Norepinephrine drip. 5.  Candesartan 16 mg p.o. b.i.d.  6.  Plavix 75 mg daily. 7.  Hydralazine 20 mg IV q.4 hours p.r.n. systolic blood pressure greater than 119 or diastolic blood pressure greater than 110. 8.  Labetalol 10 mg IV q.4 hours p.r.n. systolic blood pressure greater than 417 or diastolic blood pressure greater than 120.  9.  Reglan 5 mg IV q.6 hours p.r.n. nausea.  10.  Zofran 4 mg IV q.4 hours p.r.n.  11.  Tylenol suppository 650 mg rectally q.4 hours p.r.n.  12.  Levofloxacin 750 mg IV q.24 hours.  13.  Meropenem 1 gram IV q.8 hours.  14.  Lovenox 30 mg subcutaneous q.24 hours.  15.  Zantac 150 mg at bedtime.   SOCIAL HISTORY: The patient's mother died from uterine cancer, per her husband's report. The patient's father died secondary to MI.   SOCIAL HISTORY: The patient is married. She lives with her husband. 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 No reported tobacco, alcohol or illicit drug use.   REVIEW OF SYSTEMS: Currently unable to obtain from the patient, as she is currently intubated and sedated.   PHYSICAL EXAMINATION: VITAL SIGNS: Temperature 99.8, pulse 88, respirations 27. Blood pressure is 75/42.  GENERAL: Critically ill-appearing Caucasian female.  HEENT: Normocephalic, atraumatic. No spontaneous extraocular movements were noted. Pupils were 5 mm and very sluggish to react. Endotracheal tube is noted to be in place.  NECK: Supple without JVD or lymphadenopathy.  LUNGS: Clear to auscultation bilaterally with breath sounds being vent assisted.  HEART: S1, S2. No murmurs, rubs or gallops appreciated.  ABDOMEN: Obese, soft, nontender, nondistended. Bowel sounds positive. No rebound or guarding. No gross organomegaly appreciated.  EXTREMITIES: No clubbing or cyanosis noted. Trace bilateral lower extremity edema noted.  NEUROLOGIC: The patient is currently intubated and sedated. She is not following any commands or responding to painful stimulation at present.  GENITOURINARY: No suprapubic tenderness noted at this time. Foley catheter noted to be in place.  MUSCULOSKELETAL: No joint redness, swelling or tenderness appreciated.  SKIN: Warm and dry. No rashes noted.  PSYCHIATRIC: The patient is intubated and sedated; therefore, psych status cannot be fully assessed at this time.   LABORATORY DATA: Sodium 136, potassium 3.3, chloride 97, CO2 of 28, BUN 20, creatinine 1.6, glucose 449, magnesium 1.3. CBC shows WBC 16.6, hemoglobin 11.5, hematocrit 33.6, platelets 228. Urinalysis shows 34 RBCs per high-power field and 638 WBCs per high-power field. ABG shows pH of 7.44, pCO2 of 43, pO2 of 133, FiO2 of 40%.   IMPRESSION: This is a 59 year old Caucasian female with past medical history of chronic diastolic heart failure, hypertension with malignant episodes, history of multiple cerebrovascular accidents, diabetes mellitus  type 2, migraine headaches, hyperlipidemia, splenomegaly, who presented to [facility] with shortness of breath initially and hospital course complicated by the development of acute infarcts in the right parietal lobe as well as seizure activity. The patient has also now developed acute renal failure in the setting of hypotension.   PROBLEM LIST: 1.  Acute renal failure, suspect acute tubular necrosis.  2.  Chronic kidney disease stage III, baseline creatinine 1.2 with eGFR of 49.  3.  Hypotension.  4.  Respiratory failure.  5.  Right parietal lobe cerebrovascular accident.   PLAN: The patient appears to be critically ill at this point in time. Hospital course complicated by right parietal CVA. The patient has now also developed hypotension. Earlier, she was significantly hypertensive. Changes in renal perfusion pressure have likely led to acute tubular necrosis now. The patient appears to be making adequate amounts of urine at present. We would certainly recommend maintaining a MAP of at least 65 to ensure renal perfusion pressure. No acute indication for dialysis at present. We will obtain renal ultrasound for further evaluation as well as urine electrolytes. I did explain to the patient's husband  that there may be a potential need for renal placement therapy if renal function were to worsen over the course of the hospitalization. He was in agreement that if dialysis were  needed, he would proceed. Currently, it appears that the clinical care team is considering transfer to a tertiary care center. Renal function could certainly be followed at the referral institution.   I would like to thank Dr. Tressia Miners for this kind referral. Further plan as the patient progresses.   ____________________________ Tama High, MD [email or web address] D: 05/02/2013 13:14:46 ET T: 05/02/2013 13:48:54 ET JOB#: [id number]  cc: Tama High, MD, <Dictator> Mariah Milling Hollyn Stucky MD ELECTRONICALLY SIGNED  05/30/2013 11:19

## 2015-02-16 NOTE — H&P (Signed)
PATIENT NAME:  Brenda Pope, Brenda Pope MR#:  [id number] DATE OF BIRTH:  06-12-1956  DATE OF ADMISSION:  09/25/2013  PRIMARY CARE PHYSICIAN: Bobbye Riggs, FNP   REFERRING PHYSICIAN: Dr. Alfonse Flavors.   CHIEF COMPLAINT: Syncope.   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 Her husband was driving the car and the patient was in the back seat. While driving the car, the patient's husband could not wake her up, despite calling multiple times. After reaching their destination, the patient's husband tried to wake her up, but she continued to be unresponsive. One of their family friends, who is a Engineer, civil (consulting), tried to check the pulse, per family, and could not find the pulse for a short time. Concerning this, EMS was called and was brought to the Emergency Department. The patient is quite somnolent, frequently falling asleep, however, arousable answers questions appropriately. The patient's daughter denies giving her any sedative medication. The patient is on multiple sedative medications. Work-up in the Emergency Department, with CT head: No change, multiple strokes, however no acute abnormality. Denies having any cough, shortness of breath. Denies having any chest pain or  palpitations.   PAST MEDICAL HISTORY:  1.  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 2.  Dysphagia status post PEG tube placement.  3.  Tracheostomy.  4.  Gastroesophageal reflux disease. 5.  Labile hypertension.  6.  Diabetes mellitus, on sliding scale insulin.  7.  Depression.    PAST SURGICAL HISTORY: 1.  Appendectomy.  2.  Cholecystectomy.   ALLERGIES: PENICILLIN.   HOME MEDICATIONS: 1.  Trazodone 25 mg once a day.  2.  Tramadol 50 mg  every four hours as needed.  3.  Spiriva 18 mcg once a day.  4.  Phenergan 6.25 mg 4 times a day.  5.  Compazine 25 mg every six hours.  6.  Phenytoin 125 mg 3 times a day.  7.  NovoLog sliding scale insulin.  8.  Nexium 40 mg once a day.  9.   Metoprolol 50 mg 3 times a day.  10.  Reglan 10 mg 3 times a day.  11.  Keppra 1000 mg every 12 hours.  12.  Vimpat 200 mg every 12 hours.  13.  Irbesartan 150 mg 2 times a day.   14.  Hydrochlorothiazide 25 mg once a day.  15.  Hydralazine 25 mg 3 times a day.  16.  Ferrous sulfate 1020 mg 2 times a day.  17.  Aspirin 325 mg once a day.  18.  Anucort rectal.  19.  Amlodipine 10 mg once a day.  20.  Tylenol 500 mg every four hours as needed.   SOCIAL HISTORY: No history of smoking, drinking alcohol or using illicit drugs. Married, lives with her husband, lives at home   FAMILY HISTORY: Father died in his 70s from a heart attack, mother died in her 42s from complications of vaginal cancer.   REVIEW OF SYSTEMS: The patient is somnolent.  CONSTITUTIONAL: Denies any generalized weakness.  EYES: No change in vision.  EARS, NOSE, THROAT: No change in hearing.  Has a tracheostomy in place.  RESPIRATORY: Has mild cough. No shortness of breath.  CARDIOVASCULAR: No chest pain, palpitations.  GASTROINTESTINAL: Has history of gastroesophageal reflux disease. Denies any nausea  or vomiting.  GENITOURINARY: No dysuria or hematuria.  ENDOCRINE: Carries a diagnosis of diabetes mellitus.  HEMATOLOGIC: Positive for anemia.  SKIN: No rash or lesions.  MUSCULOSKELETAL: The patient complains of pain in the left shoulder since June of this year.  NEUROLOGIC: Has had multiple, strokes with left-sided residual paralysis.  PSYCHIATRIC:  Has history of depression.   PHYSICAL EXAMINATION: GENERAL: This is a well-built, well-nourished, age-appropriate female, lying down in the bed, not in distress.  VITAL SIGNS: Temperature 97.7, pulse 65, blood pressure 130/73,  respiratory rate of 18, oxygen saturation 100% on room air.  HEENT: Head normocephalic, atraumatic. There is no sclerae icterus. Conjunctivae normal. Pupils equal and react to light. Extraocular movements are intact. Mucous membranes: Mild dryness. No pharyngeal erythema.  NECK:  Supple.  No lymphadenopathy. No JVD. No carotid bruit. Trach in place.  CHEST: Has no focal tenderness.  LUNGS: Bilaterally clear to auscultation.  HEART: S1 and S2 regular. No murmurs are heard.  ABDOMEN: Bowel sounds present. Soft, nontender, nondistended. PEG tube in place.  EXTREMITIES: No pedal edema. Pulses 2+.  SKIN: No rash or lesions.  MUSCULOSKELETAL: Did not examine. NEUROLOGIC:  Patient is somnolent, however, oriented to place, person and time. No cranial nerve abnormalities.  Has dense left hemiplegia.   LABORATORY DATA: CT head without contrast: No acute intracranial abnormality. Chest x-ray, one view portable: No acute cardiopulmonary disease.  X-ray of the left shoulder: No acute abnormality identified.   Complete metabolic panel is completely within normal limits. CBC: Hemoglobin of 7.8.   ASSESSMENT AND PLAN: This patient is a 59 year old female who comes to the Emergency Department with unresponsiveness for about 30 minutes.  1.  Syncope. The patient is on multiple sedative medications. The possibility that the patient was sleeping and the patient's husband could not wake her up while driving. However, considering the patient's history of multiple strokes, seizure disorder, will obtain EEG. CT head without contrast: No acute abnormalities noted. Will obtain MRI of the brain. No new weakness is noted. 2.  The other possibility of seizures. Will check the phenytoin level.  The patient is on Keppra and Vimpat.   3.  Hypertension. Currently well controlled. Continue with the home medications.  4.  History of multiple strokes. Continue with aspirin.  5.  Anemia. We will check the iron profile, B12, folate,  RBC. The patient has stool occult positive.  The patient has a history of severe gastroesophageal reflux disease.  There is a possibility of gastroenteritis. The patient had EGD done while placing a PEG tube, which was found to be within normal limits. The patient has never had a colonoscopy. The patient is extremely anxious, will need to be completely sedated in order to do a colonoscopy. We will continue to follow up with CBC. The patient would benefit from getting work-up done as an outpatient.  6.  Keep the patient on deep vein thrombosis prophylaxis with SCDs.   TIME SPENT:  55 minutes.   ____________________________ Susa GriffinsPadmaja Edrian Melucci, MD pv:cg D: 09/25/2013 01:57:48 ET T: 09/25/2013 03:00:30 ET JOB#: [id number]  cc: Susa GriffinsPadmaja Lalaine Overstreet, MD, <Dictator> [name], FNP Susa GriffinsPADMAJA Santhosh Gulino MD ELECTRONICALLY SIGNED 10/05/2013 21:06

## 2015-02-16 NOTE — Consult Note (Signed)
PATIENT NAME:  Brenda SarnaFRICK, Lilou E MR#:  [id number] DATE OF BIRTH:  [birth year]  DATE OF CONSULTATION:  07/11/2013  REFERRING PHYSICIAN:  Dr. Cherlynn KaiserSainani.  CONSULTING PHYSICIAN:  Lamar BlinksBruce J. Ashford Clouse, MD  REASON FOR CONSULTATION: Malignant hypertension, strokes, heart failure, cardiomyopathy and further medication management.   CHIEF COMPLAINT: The patient feels relatively well but has had some nausea earlier in her hospitalization.   HISTORY OF PRESENT ILLNESS: This is a 59 year old female with known multiple strokes with disability and apparent previous congestive heart failure and LV systolic dysfunction. The patient has been placed on appropriate medication management with this and has been in a rehab facility. The patient's blood pressure was well controlled in the 140 to 150 systolic range with multiple medications. When she arrived here, she had nausea, vomiting and some other concerns with possible aspiration. With this issue, the patient had had further assessment and treatment and found that changing her tube feedings and assessing her swallowing has helped this a great deal. Since then, she has not had any significant symptoms of stroke or other concerns at this time. The patient has had no evidence of congestive heart failure during this hospitalization and no evidence of angina. She remains on aspirin for her further risk reduction and antiseizure medications. Prior to discharge to home and/or other rehabilitation, the patient will need further adjustments of medication management for blood pressure range in the 140 systolic range. She has been on metoprolol, Avapro, hydrochlorothiazide in the past but is requiring amlodipine as well. The patient may benefit from a slightly more even dose of metoprolol.   The remainder of review of systems is hard to assess due to the patient's current condition.   PAST MEDICAL HISTORY:  1. Cardiomyopathy with heart failure.  2. Hypertension.  3. Hyperlipidemia.  4.  Diabetes.  5. Stroke.   FAMILY HISTORY: No family members with early onset of cardiovascular disease or stroke.   SOCIAL HISTORY: The patient currently denies alcohol or tobacco use.   ALLERGIES: As listed.   PHYSICAL EXAMINATION:  VITAL SIGNS: Blood pressure is 168/72 bilaterally. Heart rate is 78 reclining.  GENERAL: She is a well-appearing female with some stroke abnormalities.  HEAD, EYES, EARS, NOSE AND THROAT: No icterus, thyromegaly, ulcers, hemorrhage or xanthelasma.  CARDIOVASCULAR: Regular rate and rhythm with normal S1 and S2. No apparent murmur, gallop or rub. PMI is diffuse. Carotid upstroke normal without bruit. Jugular venous pressure is normal.  LUNGS: A few basilar crackles with some expiratory wheezes.  ABDOMEN: Soft, nontender. No apparent hepatosplenomegaly or masses. Abdominal aorta cannot be felt or heard.  EXTREMITIES: There are 2+ radial, trace femoral, no dorsal pedal pulses. Trace lower extremity edema. No cyanosis, clubbing or ulcers.  NEUROLOGIC: The patient is oriented to time, place and person, with normal mood and affect.   ASSESSMENT: A 59 year old female with hypertension, hyperlipidemia, cardiomyopathy and multiple strokes with disability, improving from change in regimen of feeding, without current evidence of heart failure or angina.   RECOMMENDATIONS:  1. Continue regulation of blood pressure with a goal systolic blood pressure of 140 systolic.  2. Change medications including listed above for help with this assessment, though current recommendations were to increase metoprolol to 50 mg 3 times per day instead of twice per day, in addition to continuing Avapro 150 mg twice per day, hydrochlorothiazide at 12.5 mg each day and amlodipine at 10 mg each day. In addition to this, the patient will need further adjustment at the rehabilitation facility, of which  we will be available for those adjustments.  3. No further cardiac diagnostics necessary at this time.    ____________________________ Lamar Blinks, MD [id number] D: 07/11/2013 17:48:33 ET T: 07/11/2013 18:21:00 ET JOB#: [id number]  cc: Lamar Blinks, MD, <Dictator> Lamar Blinks MD ELECTRONICALLY SIGNED 07/12/2013 7:35

## 2015-02-16 NOTE — Consult Note (Signed)
PATIENT NAME:  Brenda Pope, Brenda Pope MR#:  [id number] DATE OF BIRTH:  1955-10-30  DATE OF CONSULTATION:  07/05/2013  REFERRING PHYSICIAN:  Katharina Caperima Vaickute, MD. CONSULTING PROVIDERS:  Lynnae Prudeobert Elliott, MD, and Ranae PlumberKimberly A. Arvilla MarketMills, ANP (Adult Nurse Practitioner)  PRIMARY CARE PHYSICIAN: [name], MD.  REASON FOR CONSULTATION: Nausea and vomiting.   HISTORY OF PRESENT ILLNESS: This 59 year old patient, with history of multiple strokes; with the last stroke in July being quite severe, requiring a medical-induced coma, tracheostomy and PEG tube insertion. The patient was just recently admitted to the hospital, 06/24/2013, with nausea and vomiting. She was discharged to the rehabilitation center. Her husband took her out of there and has been caring for her at home for about 36 hours. He has noted that she was tolerating the tube feedings well while she was at the resident home.   Since he has been feeding her, she has been receiving 8 ounces of Glucerna, 45 mL of protein drink followed by 400 mL of water for breakfast. For lunch, supper at 5:00 p.m. and bedtime feeding at 9:00 p.m., she is receiving 16 ounces of Glucerna at each meal followed by 400 mL of water. The patient has been telling him that she is getting too full.   After the first couple of feedings, she starts to get full, complains of heartburn, reflux, belching. About 5:00 p.m., she says she feels like she is going to bust. The husband has sometimes given her continued feedings. Other times he has had to cut back, but he is concerned about her nutritional status. He says that she starts to have nausea and vomiting if he continues to feed her as directed.   He did note that there is mucus in her bowel movement and she is producing a lot of mucus. She has had excessive coughing, and yesterday she had repetitive vomiting, about 4 or 5 times. There was no hematemesis. He was concerned over aspiration pneumonia and brought her to the hospital.   In the  Emergency Room, the patient was found to initially be hypoxic. Chest x-ray showed more dense interstitial markings on the left than on the right, and the patient was somewhat lethargic. She was therefore admitted to the hospital and has been receiving IV antibiotic therapy and Dr. Meredeth IdeFleming is following her from pulmonary. She has had dietary consult.   The patient has been receiving Reglan 10 mg before meals at home. She has been receiving Phenergan 12.5 mg dose daily. Her husband reports she was not given a prescription for Zofran. The patient is receiving continuous tube feeds at this time and did have several episodes of vomiting this morning. Nursing reports residuals are not high.   This patient was seen by Dr. Lutricia FeilPaul Oh recently for GI consultation, 06/27/2013, for nausea, vomiting, and he thought she improved with the combination of Phenergan and low-dose Reglan with Zofran. The tube feeding appeared to be working well. He recommended avoid feeding late at night when she is lying down to prevent acid reflux. He recommended checking for residuals. No history of EGD reported.   PAST MEDICAL HISTORY:  1.  Type 2 diabetes mellitus.  2.  Hypertension.  3.  Multiple strokes. The last stroke was in July 2014, with left-sided paralysis, tracheostomy and PEG tube.  4.  Migraine headache.  5.  Hyperlipidemia.  6.  History of epilepsia.  7.  Severe systemic hypertension.  8.  Splenomegaly.   PAST SURGICAL HISTORY:  1.  Cholecystectomy.  2.  Appendectomy.  3.  Exploratory surgery for adhesions.   MEDICATIONS ON ARRIVAL:  1.  NovoLog 4 units subcu nightly.  2.  Lantus 40 units subcutaneous once daily.  3.  Phenytoin 25 mg three times daily.  4.  Metoclopramide 10 mL three times daily.  5.  Keppra 10 mg per mL twice daily.  6.  Phenergan 12.5 mg daily.  7.  Irbesartan 300 mg daily.  8.  Lacosamide 200 mg twice daily.  9.  Spironolactone 25 mg daily.  10.  Metoprolol tartrate 20ml twice  daily. 11.  Fluconazole 100 mg once daily.  12.  Levetiracetam 10 mg twice daily.   The patient's husband did not mention acetaminophen, aspirin, Peridex, hydrochlorothiazide.   ALLERGIES: PENICILLIN YIELDS RASH.   HABITS: Negative tobacco or alcohol.   FAMILY HISTORY: Father deceased with MI at 50. Mother deceased in her 55s from complication of vulva cancer.   REVIEW OF SYSTEMS: Unable to obtain as the patient is lethargic and not responding. She opens her eyes and sleeps. Nursing staff reports she just received antiemetic.   PHYSICAL EXAMINATION:  VITAL SIGNS: 98.2, 88, 17. Blood pressure 130/69. Pulse oximetry is 100% on trach collar aerosol mask.  GENERAL: The patient is sleeping, opens her eyes, looks at speaker briefly, closes her eyes. Does not follow any other commands. She appears to be in no distress. Trachea collar is attached with oxygen. Respiratory therapy is giving her treatment.  HEENT: Head is normocephalic. Conjunctivae pink. Sclerae anicteric.  CARDIAC: S1 and S2.  LUNGS: CTA anteriorly. No cough noted.  ABDOMEN: Soft. There is a PEG tube in place. Dressing dry. Site not examined. Nontender abdomen. Soft, nondistended. The patient shows no grimace with palpation.  RECTAL: Deferred.  EXTREMITIES: The patient with slight edema in the extremities.  NEUROLOGIC: Paralysis, left side. The patient is following no commands except open her eyes.  SKIN: Warm and dry without rash. Posterior skin not evaluated.   LABORATORY, DIAGNOSTIC, AND RADIOLOGICAL DATA: Admission laboratory studies notable for glucose 219, BUN 16, creatinine 0.60, sodium 134, albumin 2.5, alkaline phos 179, AST 52, ALT 89. CPK total and troponin negative. WBC 7.9, hemoglobin 8.8, platelet count 311. MCV 91. Blood culture no growth. Urine culture no growth.   Repeat laboratory studies today with albumin 2.3, alkaline phos 164. LFTs have normalized. Albumin 2.3, hemoglobin 8, MCV 91.   RADIOLOGY: Chest  x-ray, single view, dated 07/04/2013, shows lungs are clear. Tracheostomy noted at the level of the clavicles. Repeat chest x-ray today, portable, single view, shows slightly increased interstitial markings in the right lung compared to the earlier study which may reflect minimal interstitial edema versus subsegmental atelectasis.   Abdominal ultrasound performed 07/04/2013 for elevation in liver enzymes showed liver unremarkable. Hepatopetal flow is identified within the portal vein. Status post cholecystectomy. Common bile duct measures 2.6 mm in diameter.   IMPRESSION: This patient had an acute on chronic stroke in July that rsulted in left sided paralysis and  a tracheostomy and percutaneous endoscopic gastrotomy tube the 1st of August. This is a new percutaneous endoscopic gastrotomy tube for this patient. She has had problems with post feeding nausea, vomiting events. She is not tolerating the current feeding regime. Possible aspiration pneumonia.  PLAN: Dietitian consult regarding new feeding regimen.   Continue her continuous tube feeds with close redidual monitoring. She does seem to be tolerating those better. We will monitor. Flat plate of the abdomen serially every day, see how she is doing. Continue with  the Protonix and the metoclopramide.   This case was discussed with Dr. Mechele Collin in collaboration of care. Further GI recommendations pending the patient's response.   These services provided by Cala Bradford A. Arvilla Market, MS, APRN, BC, ANP (Adult Nurse Practitioner) under collaborative agreement with Manfred Shirts, M.D.   ____________________________ Ranae Plumber. Arvilla Market, ANP (Adult Nurse Practitioner) kam:np D: 07/05/2013 17:38:27 ET T: 07/05/2013 18:40:17 ET JOB#: [id number]  cc: Cala Bradford A. Arvilla Market, ANP (Adult Nurse Practitioner), <Dictator> Ranae Plumber Suzette Battiest, MSN, ANP-BC Adult Nurse Practitioner ELECTRONICALLY SIGNED 07/06/2013 8:12

## 2015-02-16 NOTE — Discharge Summary (Signed)
PATIENT NAME:  Brenda Pope, Brenda Pope MR#:  [id number] DATE OF BIRTH:  1956-06-05  DATE OF ADMISSION:  09/25/2013 DATE OF DISCHARGE:  09/25/2013  ADMISSION DIAGNOSIS:  1.  Altered mental status.   DISCHARGE DIAGNOSES:  1.  Altered mental status, likely secondary to sleeping versus medications.  2.  History of cerebrovascular accident.  3.  History of  diabetes.   4.  Urinary tract infection.  5.  History of seizure disorder with history of hypertension.   CONSULTATIONS: None.   HISTORY OF PRESENT ILLNESS: This is a 59 year old female who was brought in via ambulance for altered mental status. For further details, please refer to H and P.  1.  Altered mental status. I think this is multifactorial. The patient had a busy morning, getting ready for dinner party. She also is on multiple medications and she also has a urinary tract infection. The patient never had any evidence of syncope. The husband tells me that the patient was lethargic it but would respond and acted very sleepy. There is also some question whether the patient lost her pulse and after speaking with the patient's husband and he said that the patient never lost her pulse. Her pulse was in the 80s but at one point it was about 58, and once again, there was no loss of consciousness or syncope. I suspect this is from being sleepy and not a cardiac or neurological event. It could also be due to her medications early events during the daytime. She also had a urinary tract infection, which may have contributed to her symptoms. At this point. She did not need an MRI or  EEG. 2.  Diabetes.  The patient will continue on her outpatient medications.  3.  Seizure disorder. The patient will continue outpatient medications.  4.  Hypertension, on hydralazine and irbesartan and Norvasc.  5.  Urinary tract infection. The patient was started on ciprofloxacin.   DISCHARGE MEDICATIONS: 1.  Acetaminophen 500 mg q. 4 hours p.r.n.  2.  Reglan 10 mL 3 times  daily.  3.  Irbesartan 150 mg b.i.d.  4.  Keppra 1000 mg q. 12 hours.   5.  Lacosamide 200 mg q. 12 hours.  6.  Phenytoin 5 mL 3 times daily.  7.  HCTZ 25 mg daily.  8.  Ferrous sulfate 5 mL b.i.d.  9.  Metoprolol 50 mg t.i.d.  10.  Norvasc 10 mg daily.  11.  Hydralazine 25 mg t.i.d.  12.  Aspirin 325 mg daily.  13.  Sliding scale insulin.  14.  Tramadol 50 mg q. 4 hours p.r.n.  15.  Nexium 40 once daily.  16.  Anucort  suppository rectally daily.  17.  Trazodone 50 mg half tablet at bedtime.  18.  Promethazine 25 mg q. 6 hours.   19.  Ciprofloxacin 500 mg q. 12 hours  x7 days.   DISCHARGE HOME HEALTH: With physical therapy.   DISCHARGE DIET: Low sodium, ADA diet.   DISCHARGE ACTIVITY: As tolerated.   The patient is medically stable for discharge.   TIME SPENT: 35 minutes   ____________________________ Shene Maxfield P. Juliene PinaMody, MD spm:cc D: 09/25/2013 13:36:39 ET T: 09/25/2013 20:24:07 ET JOB#: [id number]  cc: Jazier Mcglamery P. Juliene PinaMody, MD, <Dictator> [name], FNP Janyth ContesSITAL P Makina Skow MD ELECTRONICALLY SIGNED 09/26/2013 12:23

## 2015-02-16 NOTE — Op Note (Signed)
PATIENT NAME:  Brenda Pope, Brenda Pope MR#:  [id number] DATE OF BIRTH:  09-Dec-1955  DATE OF PROCEDURE:  07/08/2013  PREOPERATIVE DIAGNOSES:  Intermittent tracheal obstruction and mucus plugging.   POSTOPERATIVE DIAGNOSIS:  Healthy trachea and open airway.  OPERATIVE PROCEDURE:  Tracheobronchoscopy through the trach site.    ANESTHESIA:  None.  COMPLICATIONS:  None.   TOTAL ESTIMATED BLOOD LOSS:  None.   DESCRIPTION OF PROCEDURE:  The patient was seen at the bedside.  A flexible scope is passed through the trach.  This is a #6 cuffless Shiley trach tube.  It is sitting in a good position in the neck with a healthy stoma.  The scope is passed through the trach site.  The trach tube is sitting in a good position in the trachea.  There is no granulation tissue or shelves.  There is no collapse of the posterior tracheal airway.  It is very clean all the way down to the carina.  The carina is sharp.  The mainstem bronchi are clear on both sides with no mucus there at all.  No granulation.  No blood-staining anywhere in the trachea or any signs of trauma in the trachea.  It looks very clear on both sides.    The patient tolerated the procedure well.  This was done at the bedside.  There were no operative complications.     ____________________________ Cammy CopaPaul H. Jachelle Fluty, MD phj:ea D: 07/08/2013 19:48:01 ET T: 07/09/2013 04:33:57 ET JOB#: [id number]  cc: Cammy CopaPaul H. Kharlie Bring, MD, <Dictator> Cammy CopaPAUL H Lylia Karn MD ELECTRONICALLY SIGNED 07/09/2013 20:39

## 2015-02-16 NOTE — H&P (Signed)
PATIENT NAME:  Brenda Pope, Brenda Pope MR#:  [id number] DATE OF BIRTH:  July 07, 1956  DATE OF ADMISSION:  07/04/2013  PRIMARY CARE PHYSICIAN: Dr. Bobbye Riggs.   REFERRING PHYSICIAN: Dr. Dolores Frame.   CHIEF COMPLAINT: Nausea, vomiting, excess mucous plugging of the  PEG tube, shortness of breath.   HISTORY OF PRESENT ILLNESS: The patient is a 59 year old female with known history of seven strokes with recent being in July 2014 with left-sided residual paralysis, hypertension, diabetes mellitus and history of epilepsy, with just recently admitted to the hospital on 06/24/2013 with similar complaint of nausea and vomiting and was sent over to the rehabilitation center. As the husband was not quite happy with the care provided by the rehabilitation center, he took her home approximately 36 hours ago. He had a strong family network and home health support to take care of his wife. The patient has tracheostomy and PEG tube following her last stroke in July 2014 and these were placed at [facility]. The patient was placed on Glucerna 1.5 bolus feeds 7 times a day along with 350 mL water flushes after each feed. The patient thinks the bolus foods are causing indigestion and bloating in her  stomach. Yesterday, after the third feed the patient was feeling bloated and eventually she was nauseous and started vomiting. The patient was also slightly short of breath and became hypoxemic. Husband has cleaned her trach collar and has noticed excess mucous plugging. The patient was also not quite active as her usual. The patient was brought into the ER. She was initially hypoxic and was placed on 100% nonrebreather in the beginning. Chest x-ray has revealed more dense interstitial markings, more on the right than on the left regarding which the patient was started on IV clindamycin for possible aspiration pneumonia. The patient was more somnolent and lethargic during my examination. According to the ER physician the patient was  somnolent during the first one to two hours of stay in ER,  she was more awake and alert, but during my examination, the patient is, again, somnolent. The patient being lethargic  history is obtained from the old medical records, ER staff and husband, who is a very good historian. He denies noticing any blood in her vomit.    PAST MEDICAL HISTORY: Hypertension, multiple strokes and the last stroke was in July 2014 and was left with left-sided residual paralysis. Type 2 diabetes mellitus and migraine headaches, hyperlipidemia, history of epilepsy, chronic diastolic heart failure with an ejection fraction of 55%.   PAST SURGICAL HISTORY: Cholecystectomy, appendectomy, PEG placement, tracheostomy.   ALLERGIES: PENICILLIN.   PSYCHOSOCIAL HISTORY: Currently residing at home. Husband is taking care of her with the help of her other members and CNAs. The patient is married and lives with her husband who takes care of her. No history of smoking, alcohol or illicit drug usage.   FAMILY HISTORY: Father died at age 80 from heart attack. Mother died in 65s from complications of  cancer.   HOME MEDICATIONS: Phenytoin 25 mg two  times a day, Zofran 4 mg via PEG tube, q.6 hours, metoprolol tartrate 20 mL via G-tube, metoclopramide 5 mL  orally 3 times a day, Lantus 40 units subcutaneous once daily, Keppra 10 mg/mL via feeding tube 2 times a day  REVIEW OF SYSTEMS:  Unobtainable as the patient is quite lethargic.   PHYSICAL EXAMINATION:  VITAL SIGNS: Temperature 97.6, pulse 56, respirations 20, blood pressure 150/73, pulse oximetry 100% on 8 liters of oxygen via trach  collar.  GENERAL APPEARANCE: Not in any acute distress. Moderately built and nourished.  HEENT: Normocephalic, atraumatic. Pupils are equally reacting to light and accommodation. No scleral icterus. No conjunctival injection.  NECK: Supple , no JVD, no thyromegaly. Trach site is intact with thick secretions No edema or tenderness around trach  site.  LUNGS: Positive rhonchi  in the bases  , overall decreased breath sounds. No accessory muscle usage.  CARDIOVASCULAR: S1, S2 normal. Regular rate and rhythm. No murmurs.  GASTROINTESTINAL: Soft. Bowel sounds are positive in all four quadrants. Nontender, nondistended. No hepatosplenomegaly. PEG tube is intact. No signs of infection. The tube seemed to be functioning fine.  SKIN: No obvious rashes, lesions. Normal turgor. No rashes. No lesions, normal  muscle tone.  MUSCULOSKELETAL: No joint effusion, tenderness or erythema.  NEUROLOGIC: Could not be elicited as the patient is lethargic but she is arousable.  PSYCHIATRIC: Mood and affect could not be elicited in view of  lethargy.  LABORATORY AND IMAGING STUDIES: Glucose 219, BUN 16 , sodium 131, potassium 4.0, chloride 95, CO2 33, GFR greater than 60. Anion gap 6, serum osmolality 276, calcium 9.2.  LFTs: Total protein 6.3, albumin 2.5, bilirubin total is 0.5, alkaline phosphatase 179, AST 52, ALT 89. Cardiac enzymes were normal. WBC 7.9, hemoglobin 8.8, hematocrit 25.0, platelets are 311. Urinalysis: Yellow in color, hazy in appearance, nitrites are negative, leukocyte esterase , hyaline casts are present, mucus is present. ABG: pH 7.40, pCO2 57, pO2 72, FiO2 is 50%,  bicarb is 35.3. EKG, normal sinus rhythm with no acute ST-T wave changes. Chest x-ray, more prominent interstitial markings, more on the right than on the left.   ASSESSMENT AND PLAN: A 59 year old female brought into the ER with nausea and vomiting associated shortness of breath and hypoxia, and increased mucus plugging of the tracheostomy site will be admitted with the following assessment and plan.  1. Pneumonia, probably aspiration pneumonia. I will admit the patient to Critical Care Unit stepdown. We will continue trach collar, 8 L of oxygen. Pan cultures were obtained. We will  provide IV antibiotics of Primaxin and Levaquin. The patient will be made nothing by mouth until  speech therapy evaluation. We will put a consult to dietitian to  consider changing PEG feeds from bolus to continuous .  2. Acute gastritis probably from problem number one or gastroesophageal reflux disease. The patient will be on IV Protonix.  3. Multiple cerebrovascular accidents with residual paralysis,  she will  be benefited with  physical therapy.  4. Diabetes mellitus. The patient will be on sliding scale insulin while she is n.p.o.  5. Hypertension. We will continue close monitoring of the high blood pressure.  6. Congestive heart failure. Does not seem to be fluid overloaded at this time.  7. History of multiple cerebrovascular accidents in the past, status post residual paralysis on the right side. The patient will be given gastrointestinal and deep vein thrombosis prophylaxis.   The diagnosis and plan of care  discussed in detail with the patient's husband at bedside. He  verbalised understanding of the plan. According to him. She is FULL CODE and husband is the medical power of attorney.   Total time spent on admission is 50 minutes.  .  ____________________________ Ramonita LabAruna Anjolaoluwa Siguenza, MD ag:sg D: 07/04/2013 06:34:00 ET T: 07/04/2013 08:02:28 ET JOB#: [id number]  cc: [name], FNP Ramonita LabAruna Cassaundra Rasch, MD, <Dictator>     Ramonita LabARUNA Madi Bonfiglio MD ELECTRONICALLY SIGNED 07/16/2013 7:16

## 2015-02-16 NOTE — Consult Note (Signed)
Brief Consult Note: Patient was seen by consultant.   Consult note dictated.  Electronic Signatures: Avis Tirone Ann (NP)  (Signed 09-Sep-14 13:41)  Authored: Brief Consult Note   Last Updated: 09-Sep-14 13:41 by Charelle Petrakis Ann (NP) 

## 2015-02-16 NOTE — Consult Note (Signed)
Chief Complaint:  Subjective/Chief Complaint somnolent reviewed the ct with family seems more c/w chf   VITAL SIGNS/ANCILLARY NOTES: **Vital Signs.:   01-Sep-14 08:59  Vital Signs Type Q 4hr  Temperature Temperature (F) 98.7  Celsius 37  Temperature Source oral  Pulse Pulse 75  Respirations Respirations 20  Systolic BP Systolic BP 171  Diastolic BP (mmHg) Diastolic BP (mmHg) 66  Mean BP 101  Pulse Ox % Pulse Ox % 100  Pulse Ox Activity Level  At rest  Oxygen Delivery Room Air/ 21 %  *Intake and Output.:   Shift 01-Sep-14 15:00  Grand Totals Intake:  397 Output:      Net:  397 24 Hr.:  397  Enteral feeding ml     In:  247  Enteral Feeding flush intake      In:  150  Length of Stay Totals Intake:  3043 Output:      Net:  3043   Brief Assessment:  GEN no acute distress   Cardiac Regular  -- thrills  -- JVD   Respiratory normal resp effort  clear BS  no use of accessory muscles   Gastrointestinal details normal Soft  Bowel sounds normal  No organomegaly   EXTR negative cyanosis/clubbing   Lab Results: Routine Micro:  31-Aug-14 09:00   Micro Text Report SPUTUM CULTURE   COMMENT                   APPEARS TO BE NORMAL FLORA AT 18-24 HOURS   GRAM STAIN                MANY WHITE BLOOD CELLS   GRAM STAIN                FEW GRAM POSITIVE COCCI IN PAIRS   GRAM STAIN                MODERATE GRAM POSITIVE ROD   ANTIBIOTIC                       Culture Comment APPEARS TO BE NORMAL FLORA AT 18-24 HOURS  Gram Stain 1 MANY WHITE BLOOD CELLS  Gram Stain 2 FEW GRAM POSITIVE COCCI IN PAIRS  Gram Stain 3 MODERATE GRAM POSITIVE ROD  Result(s) reported on 27 Jun 2013 at 09:04AM.  Routine Hem:  31-Aug-14 04:04   WBC (CBC) 5.5  RBC (CBC)  2.52  Hemoglobin (CBC)  8.1  Hematocrit (CBC)  22.7  Platelet Count (CBC) 246  MCV 90  MCH 32.1  MCHC 35.5  RDW  17.0  Neutrophil % 79.5  Lymphocyte % 9.8  Monocyte % 8.7  Eosinophil % 1.4  Basophil % 0.6  Neutrophil # 4.3   Lymphocyte #  0.5  Monocyte # 0.5  Eosinophil # 0.1  Basophil # 0.0 (Result(s) reported on 26 Jun 2013 at 04:36AM.)   Radiology Results: CT:    30-Aug-14 20:23, CT Chest With Contrast  CT Chest With Contrast   REASON FOR EXAM:    pneumonia  COMMENTS:       PROCEDURE: CT  - CT CHEST WITH CONTRAST  - Jun 25 2013  8:23PM     RESULT: History: Pneumonia.    Comparison Study: CT abdomen Mar 20, 2013.    Findings: Standard CT obtained with 70 cc of Isovue 300. Evaluation in 3   dimensions on separate workstation performed.    Tracheostomy tube in good position. Mediastinum unremarkable. Aberrant   right subclavian  artery is present, this is a normal variant. Pulmonary   arteries are normal. Cardiomegaly. Mildbilateral interstitial prominence     noted. Mild CHF and/or pneumonitis cannot be excluded. Tiny right pleural   effusion is noted. Adrenals are normal. Small sliding hiatal hernia   present.    IMPRESSION:   1. Mild CHF versus interstitial pneumonitis. Interstitial pelvis is very   mild. No pulmonary embolus.  2. Coronary artery disease. Cardiomegaly.        Verified By: Gwynn BurlyHOMAS E. REGISTER, M.D., MD   Assessment/Plan:  Assessment/Plan:  Assessment Pneumonia/CHF -not clear if this is just infection with superimposed CHF.  -Would treat CHF aggressively  -monitor IOs closely   Electronic Signatures: Yevonne PaxKhan, Vadie Principato A (MD)  (Signed 01-Sep-14 11:52)  Authored: Chief Complaint, VITAL SIGNS/ANCILLARY NOTES, Brief Assessment, Lab Results, Radiology Results, Assessment/Plan   Last Updated: 01-Sep-14 11:52 by Yevonne PaxKhan, Jerni Selmer A (MD)

## 2015-02-16 NOTE — Consult Note (Signed)
CC: vomiting.  this may be related to too much water and feeding at one time.  May need EGD but hold for now.  See NP note for details.  Electronic Signatures: Scot JunElliott, Davanta Meuser T (MD)  (Signed on 09-Sep-14 19:42)  Authored  Last Updated: 09-Sep-14 19:42 by Scot JunElliott, Garcia Dalzell T (MD)

## 2015-02-16 NOTE — Consult Note (Signed)
Pt seen and examined. Full consult to follow. S/P CVA. S/P PEG and trach. Admiited with nausea/vomiting with TF feeding. Appears to be doing better now on zofran/phnergan and low dose reglan. Make sure to give reglan/zofran prior to each tube feeding. Avoid giving TF late at night before going to sleep and lying flat on bed. Will sign off for now. Call us back if symptoms recur in the future. THanks.    Electronic Signatures: Lutricia Feilh, Redell Bhandari (MD) (Signed on 01-Sep-14 11:36)  Authored   Last Updated: 01-Sep-14 12:22 by Lutricia Feilh, Alec Mcphee (MD)

## 2015-02-16 NOTE — Consult Note (Signed)
CC: vomiting.  Pt talking well today and said she would start coughing when trach tube partly plugged and this coughing would make her vomit.  No vomiting today on reglan and zofran.  tube feeding up to 40cc/hr.  No new suggestions.  Might do better to go home on drip rather than bolus feedings.  Electronic Signatures: Scot JunElliott, Robert T (MD)  (Signed on 11-Sep-14 18:09)  Authored  Last Updated: 11-Sep-14 18:09 by Scot JunElliott, Robert T (MD)

## 2015-02-16 NOTE — H&P (Signed)
PATIENT NAME:  Brenda Pope, Brenda Pope MR#:  [id number] DATE OF BIRTH:  11-Aug-1956  DATE OF ADMISSION:  04/30/2013  PRIMARY CARE PHYSICIAN:  [facility].   CARDIOLOGIST:  Dr. Pernell Dupre in [facility].   REFERRING PHYSICIAN:  Dr. Lucrezia Europe.   CHIEF COMPLAINT:  Increased shortness of breath.   HISTORY OF PRESENT ILLNESS:  Mrs. Scannell is a 59 year old Caucasian female with history of chronic diastolic congestive heart failure, severe systemic hypertension, diabetes mellitus type 2.  She was in her usual state of health until about two weeks ago when she had sinus congestion.  She saw her primary care physician and placed her on antibiotic utilizing doxycycline.  At the same time, she noticed that she has increased shortness of breath, increased peripheral edema of the ankles on both sides associated with increased orthopnea and exertional dyspnea.  She called her cardiologist who instructed her to double the Lasix from 40 twice a day to 80 mg twice a day and if she is not better to go to the Emergency Department.  However, she did not improve, therefore she ended here.  Evaluation here revealed evidence of early interstitial pulmonary edema by chest x-ray, volume overload, peripheral edema and mild hyponatremia associated with also severe hypokalemia.  Additionally, her blood sugar is uncontrolled reaching more than 600.  For all of these reasons, the patient is admitted to the hospital for further evaluation and management of her findings in addition to uncontrolled blood pressure.   REVIEW OF SYSTEMS:  CONSTITUTIONAL:  Denies any recent fever, only two weeks ago when her sinusitis started, she had some fever and chills at that time.  Denies fatigue.  EYES:  No blurring of vision.  No double vision.  EARS, NOSE, THROAT:  No hearing impairment.  No sore throat.  No dysphagia.  CARDIOVASCULAR:  No chest pain, but reports the exertional dyspnea and orthopnea.  She has also bipedal edema.  No syncope.   RESPIRATORY:  Shortness of breath, but no hemoptysis.  No chest pain.  GASTROINTESTINAL:  No abdominal pain, no vomiting, no diarrhea.  GENITOURINARY:  No dysuria.  No frequency of urination.  MUSCULOSKELETAL:  No joint pain or swelling.  No muscular pain or swelling.  INTEGUMENTARY:  No skin rash.  No ulcers.  NEUROLOGY:  No focal weakness.  No seizure activity.  No headache.  PSYCHIATRY:  No anxiety.  No depression.  ENDOCRINE:  No polyuria or polydipsia.  No heat or cold intolerance.   PAST MEDICAL HISTORY:  History of chronic diastolic congestive heart failure with ejection fraction of 55% by echocardiogram in 2012, severe systemic hypertension, history of stroke, diabetes mellitus type 2, uncontrolled, migraine headaches, hyperlipidemia and also splenomegaly.   PAST SURGICAL HISTORY:  Cholecystectomy, appendectomy, exploratory surgery for adhesions.   FAMILY HISTORY:  Her father died in his 15s from a heart attack.  Her mother died in her 68s from complications of vaginal cancer.   SOCIAL HABITS:  Nonsmoker.  No history of alcohol or drug abuse.   SOCIAL HISTORY:  She is married, living with her husband.  She works as Occupational hygienist.   ADMISSION MEDICATIONS:  Metoprolol succinate 100 mg once a day, Lasix 40 twice a day, increased lately to 80 mg twice a day, Lantus 25 units at night.  She is also on NovoLog 20 units twice a day.  She has mixed up and she is unsure which one is long-acting, which is one short-acting.  I am not sure  about her compliance.  She is also not taking the Aldactone.  She is supposed to be on 25 mg a day, Ventolin HFA as needed, Trilipix 135 mg once a day, omeprazole 40 mg a day, Flonase nasal spray, doxycycline 100 mg twice a day recently started for her sinusitis, bupropion 75 mg once a day, Atacand 16 mg twice a day, aspirin 81 mg a day.   ALLERGIES:  PENICILLIN CAUSING SKIN RASH.   PHYSICAL EXAMINATION: VITAL SIGNS:  Blood pressure 176/76,  respiratory rate 18, pulse 60, temperature 97.8, oxygen saturation 98%.  GENERAL APPEARANCE:  Middle-aged female lying in bed in no acute distress.  HEAD AND NECK:  No pallor.  No icterus.  No cyanosis.  Ear examination revealed normal hearing, no discharge, no lesions.  Examination of the nose showed no discharge, no bleeding, no ulcers.  Oropharyngeal examination revealed normal lips and tongue, no oral thrush, no ulcers.  Eye examination revealed normal eyelids and conjunctivae.  Both pupils are 4 mm, round, equal and reactive to light.  NECK:  Supple.  Trachea at midline.  No thyromegaly.  No cervical lymphadenopathy.  No masses.  HEART:  Revealed normal S1, S2.  No S3, S4.  No murmur.  No gallop.  No carotid bruits.  LUNGS:  Revealed normal breathing pattern without use of accessory muscles.  She has fine crackles at the bases bilaterally, more on the right than the left.  No wheezing.  ABDOMEN:  Soft without tenderness.  No hepatosplenomegaly.  No masses.  No hernias.  SKIN:  Revealed no ulcers.  No subcutaneous nodules.  Her peripheral edema had subsided by the time of my examination.  She has trace pedal edema if any.  MUSCULOSKELETAL:  No joint swelling.  No clubbing.  NEUROLOGIC:  Cranial nerves II through XII are intact.  No focal motor deficit.  PSYCHIATRIC:  The patient is alert and oriented x 3.  Mood and affect were normal.   LABORATORY FINDINGS:  Her EKG showed normal sinus rhythm at rate of 68 per minute.  Left ventricular hypertrophy.  Nonspecific ST-T wave abnormalities in the lateral leads.  Her chest x-ray showed heart size is enlarged.  There is mild interstitial opacities more consistent with interstitial edema.  Her serum B-type natriuretic peptide was 1796, the sugar was 611, BUN 11, creatinine 1.2, sodium 131, potassium 2.8, CPK 62.  Troponin 0.02.  CBC showed a white count of 7000, hemoglobin 13, hematocrit 38, platelet count 237.   ASSESSMENT: 1.  Acute interstitial lung  edema secondary to decompensated diastolic congestive heart failure.  2.  Acute on chronic diastolic congestive heart failure with ejection fraction of 55%.  3.  Uncontrolled diabetes mellitus type 2, with blood sugar reaching more than 600.  4.  Uncontrolled hypertension.  5.  Hypokalemia.  6.  Hyponatremia.  7.  Acute sinusitis.   8.  I raised the question about compliance with medications, may be an additional factor.  9.  Hyperlipidemia.  10.  History of stroke.  11.  History of migraine headaches.   PLAN:  We will admit the patient for observation.  She feels much better now than earlier and her peripheral edema is only trace now.  I will change the Lasix from by mouth to IV 40 mg twice a day for further reduction of the volume overload.  Potassium replacement of most importance.  Repeat basic metabolic profile to ensure improvement in the sodium and potassium.  Resume Aldactone.  I will give her  32 mg of Atacand now for better blood pressure control.  The patient received regular insulin and we will continue to doing so to control her blood sugar and I will place her on sliding scale.  Lovenox 40 mg subcutaneous once a day for deep vein thrombosis prophylaxis.   Time spent in evaluating this patient, reviewing her medical records took more than 1 hour.     ____________________________ Carney CornersAmir M. Rudene Rearwish, MD amd:ea D: 04/30/2013 02:58:42 ET T: 04/30/2013 04:00:35 ET JOB#: [id number]  cc: Carney CornersAmir M. Rudene Rearwish, MD, <Dictator> Karolee OhsAMIR Dala DockM Windell Musson MD ELECTRONICALLY SIGNED 04/30/2013 22:10

## 2015-02-16 NOTE — Consult Note (Signed)
CC: nausea and vomiting.  Pt tol slow feeding with rate up to 50cc/hr without vomiting.  Husband notes pt has variable BP and this is producing anxiety due to worry that when it bounces up she will get another stroke.  Should go home on continous drip if possible.  Electronic Signatures: Scot JunElliott, Titiana Severa T (MD)  (Signed on 12-Sep-14 17:29)  Authored  Last Updated: 12-Sep-14 17:29 by Scot JunElliott, Diamonique Ruedas T (MD)

## 2015-02-16 NOTE — Consult Note (Signed)
PATIENT NAME:  Brenda Pope, Brenda Pope MR#:  [id number] DATE OF BIRTH:  08/30/1956  DATE OF CONSULTATION:  06/25/2013  CONSULTING PHYSICIAN:  Yevonne PaxSaadat A. Chandan Fly, MD  REASON FOR CONSULT:  Possible pneumonia.  REPORT OF CONSULTATION:  A 59 year old female with a chronic trach, had been having some nausea and vomiting. She presented to the hospital with the same, and a chest x-ray that was done showed some infiltrate in the left lower lobe. She apparently had suffered multiple strokes, and had been in July 2014. [facility] was actually [facility] at that time. Tracheostomy was put in at that time, and she was discharged to home with a chronic trach and T collar.   PAST MEDICAL HISTORY: Significant for hypertension, multiple strokes, type 2 diabetes, headaches, hyperlipidemia, history of seizures.  SURGICAL HISTORY:  Appendectomy, cholecystectomy.   FAMILY HISTORY:  Positive for coronary disease.   SOCIAL HISTORY:  She does not smoke or drink.   REVIEW OF SYSTEMS:  A 12-point review of systems was performed and is unremarkable.   PHYSICAL EXAMINATION: GENERAL: At the time she was seen, she was awake, just had a vomiting episode.  VITAL SIGNS: Temperature was 97.2, pulse 69, respiratory rate 20, blood pressure 178/72, her saturation was 100%.  NECK:  Appeared to be supple. There was no JVD. Janina Mayorach site looked clean.  CHEST:  Showed good air entry. No rhonchi or rales. Expansion was equal.  ABDOMEN:  Soft and benign.  EXTREMITIES: Without edema. No cyanosis, clubbing.  NEUROLOGIC: She was awake, and she was able to move all 4 extremities. Gait was obviously not checked.   LAB RESULTS:   She had a CT of the abdomen and pelvis done, which showed some interstitial infiltrate in the lung bases. She has not had a full CT done of the chest. Chest x-ray showed some left lower lobe infiltrate. Her cultures were sent, blood cultures not growing anything at this time. She had a CBC that was done, showed white  count 4.3, hemoglobin 8.1, hematocrit 23.2.   IMPRESSION:  Possible pneumonia. She has had recurrent vomiting episodes, and she may be actually aspirating as a cause of her pneumonia. Right now, she has been started on broad-spectrum antibiotics. Sputum cultures, etc. were sent, and the blood cultures have been negative. I would consider doing a CT of the chest, and I have gone ahead and actually ordered that. Would continue with pulmonary toilet, etc. Will make further recommendations based on results of the CT.   Thank you for consulting me  in the care of this patient.   ____________________________ Yevonne PaxSaadat A. Karri Kallenbach, MD sak:mr D: 06/25/2013 17:18:09 ET T: 06/25/2013 19:55:37 ET JOB#: [id number]  cc: Yevonne PaxSaadat A. Yailyn Strack, MD, <Dictator> Yevonne PaxSAADAT A Atha Mcbain MD ELECTRONICALLY SIGNED 07/19/2013 16:54

## 2015-02-16 NOTE — Discharge Summary (Signed)
PATIENT NAME:  Brenda SarnaFRICK, Brenda E MR#:  [id number] DATE OF BIRTH:  Oct 12, 1956  DATE OF ADMISSION:  06/24/2013 DATE OF DISCHARGE:  06/30/2013  PRIMARY CARE PHYSICIAN: [name], FNP  DISCHARGE DIAGNOSES: 1.  Nausea and vomiting.  2.  Aspiration without pneumonia.  3.  Hypertension.  4.  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  5.  History of seizures.  6.  Diabetes mellitus, insulin-dependent.  7.  Chronic respiratory failure on trach.  IMAGING STUDIES: Included: 1.  Chest x-ray which showed no acute abnormalities.  2.  CT of the chest which showed no clear infiltrate or effusion.  3.  Abdominal x-ray showed no obstruction or ileus.   CONSULTANTS: Dr. Bluford Kaufmannh of GI and Dr. Welton FlakesKhan with pulmonology.   ADMITTING HISTORY AND PHYSICAL: Please see detailed H and P dictated by Dr. Sherryll BurgerShah on 06/30/2013. In brief, a 59 year old female patient with history of multiple strokes presented to the hospital with nausea and vomiting. The patient had cough and sputum, yellowish. Was thought to have possible aspiration pneumonia and admitted to the hospitalist service.   HOSPITAL COURSE: 1.  Aspiration. The patient did have a chest x-ray which showed no clear infiltrate, being afebrile with normal white count. Initially started on antibiotics, but were discontinued as she had some chemical pneumonitis, but no infective aspiration pneumonia. The patient presently is doing well with her breathing.  2.  Nausea and vomiting. The patient was seen by Dr. Bluford Kaufmannh of GI who suggested controlling the nausea and vomiting with medications. She was started on scheduled 10 mg Reglan prior to meals 3 times a day with which she has done well without any vomiting, although she had some element of nausea. Also significant contributing cause was the fact that she was being fed 2 cans along with 5 mL of fluid at the same time causing significant distention. She has been spaced out with her fluids after dietary  consult.  3.  History of CVA, stable.  4.  Hypertension, uncontrolled. Hydrochlorothiazide added to her medication regimen.   Today prior to discharge the patient does not have any tenderness on abdominal examination. Her PEG site is stable. Bowel sounds are present.   DISCHARGE MEDICATIONS: Include:  1.  Phenytoin 150 mg via G-tube 3 times a day.  2.  Keppra 1000 mg oral 2 times a day.  3.  Acetaminophen 500 mg every 4 hours as needed for fever.  4.  Aspirin 325 mg once a day.  5.  Peridex 0.125% mucous membrane liquid 2 times a day.  6.  Fluconazole 100 mg oral once a day.  7.  Insulin Lantus 40 units subcutaneous once a day. 8.  Irbesartan 300 mg oral once a day. 9.  Lacosamide 200 mg oral 2 times a day.  10.  Metoprolol tartrate 50 mg oral 2 times a day.  11.  Free water 250 mL 2 times a day.  12.  Glucerna 237 mL through the PEG tube 5 times a day at 8:00 a.m., 11:00 a.m., 2:00 p.m. 5:00 p.m., and 8:00 p.m. with 50 mL water flush before and after the feed. 13.  Zofran 4 mg oral every 6 hours as needed ODT.  14.  Reglan 10 mL 3 times a day before meals through the PEG.  15.  Hydrochlorothiazide 12.5 mg oral once a day through the PEG.  DISCHARGE INSTRUCTIONS: Pain-feed with the patient being n.p.o. Follow dietary instructions. The patient is to follow up with ENT for her trach  care. Activity as tolerated.   The patient will need followup with Dr. Bluford Kaufmann for her nausea and vomiting and maybe EGD in the future if her nausea does not resolve.   TIME SPENT: On day of discharge in discharge activity was 45 minutes.  ____________________________ Molinda Bailiff Jorie Zee, MD srs:sb D: 06/30/2013 13:29:24 ET T: 06/30/2013 14:06:08 ET JOB#: [id number]  cc: Wardell Heath R. Livie Vanderhoof, MD, <Dictator> Orie Fisherman MD ELECTRONICALLY SIGNED 06/30/2013 20:40

## 2015-02-17 NOTE — Discharge Summary (Signed)
PATIENT NAME:  Brenda Pope, Brenda Pope MR#:  [id number] DATE OF BIRTH:  1956/09/25  DATE OF ADMISSION:  01/24/2014 DATE OF DISCHARGE:  01/27/2014  REASON FOR ADMISSION: Hypotension and lethargy.   DISCHARGE DIAGNOSES: 1. Acute metabolic encephalopathy secondary to sepsis and dehydration.  2. Hypotension.  3. Sepsis with leukocytosis, hypotension, altered mental status secondary to urinary tract infection.  4. Acute renal failure.  5. Urinary retention, likely secondary to neurogenic bladder. 6. Decubitus ulcer stage 1. 7. History of percutaneous endoscopic gastrostomy tube, removed.   PROCEDURE: Upper endoscopy for removal of gastrostomy tube.  OTHER DIAGNOSES: 1. Anemia of chronic disease, stable. 2. Bedbound.  3. Acute kidney injury now resolved.  4. Hyponatremia.  5. Hypomagnesemia.  6. Hyperkalemia.  7. Hypoalbuminemia.  8. Malnutrition.   DISPOSITION: Home.   MEDICATIONS AT DISCHARGE: Phenytoin 5 mL 3 times a day, ferrous sulfate 220 mg 2 times a day, aspirin 325 mg once daily, tramadol 50 mg every 4 hours as needed for pain, trazodone 50 mg take a half tablet once a day at bedtime, promethazine as needed for nausea, Keppra 500 mg twice daily, Protonix 40 mg daily, Zofran 4 mg as needed for nausea, Tylenol as needed for pain and fever, Benadryl as needed for itching or insomnia, Mucinex as needed for cough, irbesartan 75 mg 2 times per day, Flomax 0.4 mg twice daily, cefuroxime 250 mg 2 times a day for 7 more days, Diflucan 150 mg 1-time dosage as needed for yeast infection, .  SUMMARY: 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 She had some urinary retention for which she was discharged with a Foley catheter, which the patient started getting really tired, really  lightheaded, and lethargic and her blood pressure started to drop again, she was evaluated in the emergency department, where her blood pressures were 50/40 and was treated with antibiotics and fluids.  The patient received a good amount of fluids. She was in acute kidney failure. Her acute kidney failure resolved after IV infusion. The patient was treated for E. coli UTI which was resistant to ciprofloxacin and levofloxacin and gentamicin. The patient underwent treatment with Rocephin and was discharged of Ceftin in good condition. The patient also has a G-tube. An EGD was done by Dr. Mechele Collin for removal of PEG tube and the tube was not able to be removed manually for what an EGD was done. The patient had tube removed without any major problems. The patient actually did quite well during this hospitalization. Her blood pressure remained on the low side but stable.BP medications were stopped. The patient was discharged on the medications mentioned above. As far as her sepsis was concerned, urinary tract infection, blood cultures were negative. The patient (had urinary retention for which she was catrheterized  previously and discharged with Foley catheter.  She is going to do some urodynamics in the office and establish the need of a permanent Foley versus suprapubic catheter. This patient was back to her baseline and discharged in the company of her family. Her husband is the primary care giver  TIME TAKEN FOR DISCHARGE: I spent about 45 minutes for this discharge.    ____________________________ Felipa Furnace, MD rsg:lt D: 01/30/2014 23:21:10 ET T: 01/31/2014 01:00:24 ET JOB#: [id number]  cc: Felipa Furnace, MD, <Dictator> [name] [facility]  GUTIERRE MD ELECTRONICALLY SIGNED 01/31/2014 21:32

## 2015-02-17 NOTE — Discharge Summary (Signed)
PATIENT NAME:  Brenda SarnaFRICK, Amos E MR#:  [id number] DATE OF BIRTH:  02-22-1956  DATE OF ADMISSION:  02/07/2014 DATE OF DISCHARGE:  02/14/2014  REASON FOR ADMISSION: Weakness with confusion and decreased urinary output.   DISCHARGE DIAGNOSES: 1. Edema secondary to fluid overload.  2. Metabolic encephalopathy.  3. Seizure disorder.  4. Anemia of chronic disease.  5. Urinary tract infection with Pseudomonas treatment for 21 days as per Dr. Sampson GoonFitzgerald.  6. Neurogenic bladder.  7. Chronic Foley catheter.  8. Hypertension.  9. Hypokalemia.  10. History of previous cerebrovascular accidents.  11. History of percutaneous endoscopic gastrostomy tube, now removed.   IMPORTANT RESULTS: Electroencephalogram:  Sharp waves are an indication of possible epilepsy arising out of the parietal right side area. No secondary generalization, which goes along with simple prtial seizures.  Glucose 129, creatinine 0.63, calcium 7.8, total protein 4.8, albumin 1.6, Dilantin level 9.8, corrected above 22. On the 18th it was 11.7, after correction was above 30 for what Dilantin was stopped.  Her white blood cell count was 4.4. Her hemoglobin at discharge is 9. Prior to  that on April 16 it was 6.4 for what the patient required 1 transfusion of packed red blood cells,  no bleeding acutely.  Urinalysis shows positive  for pseudomonas,  which is a (sensitive to cipro ) .  DISPOSITION: Home.   DISCHARGE MEDICATIONS:  1. Ferrous sulfate 220 mg daily.  2. Aspirin 325 mg daily.  3. Tramadol 50 mg every 4 hours as needed for pain.  4. Promethazine 25 mg every 6 hours as needed for nausea.  5. Keppra 500 mg twice daily.  6. Protonix 40 mg once a day.  7. Zofran 4 mg every 8 hours.  8. Tylenol extended-release as needed for pain.  9. Benadryl 25 mg once a day at bedtime.  10. Mucinex 600 mg twice daily for cough.  11. Metoprolol 50 mg twice daily. 12. Vimpat 200 mg a day. 13.  Docusate 10 mg twice daily. 14.   Ciprofloxacin 500 mg every 12 hours.  15. Lactobacillus 1 capsule orally twice daily.  16. Stop phenytoin.   17. Stop trazodone. 18. Stop hydrochlorothiazide. 19.  Stop Flomax for now.    The patient was admitted with a chief complaint of not acting like herself. She was weak. She was dehydrated and lightheaded, significant confusion, for what a urinalysis was done. Urinalysis showed Pseudomonas aeruginosa on her. The patient was admitted for further treatment. Infectious disease was consulted in the case.  Recommendation to continue ciprofloxacin based on her sensitivities.  Ciprofloxacin is going to be given for a total of 21 days.   As far as this urinary tract infection, it cleared up very well. As far as her metabolic  encephalopathy, the patient had this happen likely secondary to urinary tract infection. On top of that, she had some abnormalities of her electroencephalogram. As far as her generalized weakness, the patient had a very poor nutritional status.  Her albumin was very low.   The patient needs better nutrition. She used to have a PEG tube, but it was removed after the patient starting eating, and the tube stopped function.    The patient said she is going to do her best effort to eat.   Seizure disorder. The patient has a history of epilepsy.  EEG was done showing some spikes on the temporal lobe, for what the patient is going to be started on Vimpat instead of dilantin, she has been tolerating i  without any significant problems   We had long conversations with this patient about CODE STATUS of his wife. At the end he was able to accept a DO NOT RESUSCITATE order, but the patient stated that she might have not made up her mind about this. The patient is going to be discharged in good condition. I expect about the best for her. The patient was discharged home in the company of her husband, who takes really good care of her. I spent about 45 minutes with this discharge.    ____________________________ Felipa Furnace, MD rsg:sg D: 02/19/2014 00:13:52 ET T: 02/19/2014 11:08:35 ET JOB#: [id number]  cc: Felipa Furnace, MD, <Dictator> Myson Levi Juanda Chance MD ELECTRONICALLY SIGNED 02/28/2014 23:03

## 2015-02-17 NOTE — Consult Note (Signed)
PATIENT NAME:  Brenda SarnaFRICK, Charrise E MR#:  [id number] DATE OF BIRTH:  Dec 06, 1955  DATE OF CONSULTATION:  01/25/2014  REFERRING PHYSICIAN:  Sital P. Juliene PinaMody, MD CONSULTING PHYSICIAN:  Ranae PlumberKimberly A. Arvilla MarketMills, ANP (Adult Nurse Practitioner)  ATTENDING GASTROENTEROLOGIST:  Scot Junobert T. Elliott, MD  REASON FOR CONSULTATION: PEG tube removal by family request.   HISTORY OF PRESENT ILLNESS: This patient has a history of CVA May 09, 2013, and has had a PEG tube placed at [facility]. She has not used the PEG tube since December and she has been tolerating a regular diet. Her husband reports that her weight before her CVA was 260 pounds. When she was discharged after that admission, she weighed 190 pounds. Her weight currently fluctuates between 180 and 195. She has been tolerating 3 meals a day regularly now sicne December without significant nausea or vomiting. The only time she has nausea is if she takes her a.m. meds without eating first. The patient also has terrible car sickness. This morning, she ate eggs, 90% of her hash browns and a bowl of cereal. Usually for breakfast she eat grits and eggs. She drinks boost through the day, eats regular meals. She consumes a whole bowl of spaghetti. She has had no problems with constipation.   PAST MEDICAL HISTORY: 1.  CVA, left-sided paralysis.  2.  Multiple CVAs.  3.  PEG tube.  4.  History of tracheostomy.  5.  Diabetes mellitus.  6.  Labile hypertension.  7.  Depression.   PAST SURGICAL HISTORY: 1.  Appendectomy.  2.  Cholecystectomy.  3.  Tracheostomy.  4.  PEG tube.   MEDICATIONS ON ADMISSION:  See admission H and P.   ALLERGIES: PENICILLIN.   REVIEW OF SYSTEMS: Ten systems reviewed. Some weakness, she is bed bound. Husband feeds and bathes  her. She can move her right arm, scratch her nose but has no control over the right fingers to actually use it to feed herself. Left paralysis of upper and lower extremity noted. She has foot drop. She has problems with  urinary tract infections only when she is straight cathed. She had a recent catheterization which they attributed to her current UTI. The patient was hospitalized with hypotension which has since been treated and resolved. The patient denies abdominal pain, chest pain, shortness of breath. Says she feels pretty good.   PHYSICAL EXAMINATION: VITAL SIGNS: Temperature 97.2, 66, 20, 135/72, pulse ox on room air is 98%.  GENERAL: Middle-aged Caucasian female resting in bed. She is alert, oriented, talking to her family, giving history.  HEENT: Head is normocephalic. Conjunctivae is pale pink. Sclerae is anicteric. Mouth is moist and dry.  NECK: Supple. Tracheostomy scar noted.  CARDIAC: S1, S2 without murmur, rub or gallop.  LUNGS: CTA anteriorly.  ABDOMEN: Soft. Bowel sounds present. PEG tube in the left upper abdomen, dressing intact, not examined.  EXTREMITIES: Without edema.  NEUROLOGIC: Left-sided paralysis, unable to move any of her left upper or lower side. Moving her right arm.  SKIN: Without rash.  Report of scaral decubitus-not examined.    LABORATORY DATA: Admission lab work with BUN 34, creatinine 1.60, albumin 1.7. Hemoglobin 7.8, WBC 14.3. Pro time 13.4, INR 1.0. Lactic acid elevated 2.10. Urine culture positive for gram-negative rod, ID and sensitivity to follow. Blood culture negative x 2. Repeat BUN today 33, creatinine 1.12. Dilantin today 19.9.   RADIOLOGY:  1.  CT of the head without contrast obtained for hypotension and lethargy showed stable chronic atrophy and white  matter ischemic changes. Remote right MCA territory infarct with encephalomalacia.  2.  Chest x-ray, single view, shows no significant abnormality.   IMPRESSION:   1.  Request to remove the percutaneous endoscopic gastrostomy tube from a  patient who has not used it at all since December. It is likely that her percutaneous endoscopic gastrostomy tube is clogged off because it has not been used. She has been eating a  regular diet fed to her by her husband. He reports her weight has been stable but varying between 180 to 195. Current weight here is 188. The concerning factor here is that she presents with an admission albumin of 1.7.  She was sick immediately prior to this admission, however.  2. Alkaline phos was 225, SGOT and ALT was 14, total protein 0.2. Her albumin was 2.9 in November and alkaline phos 143.  3.  She presents with encephalopathy and sepsis thought secondary to urinary tract infection and dehydration. Acute renal failure likely secondary to the same.  4.  The patient has had problems with urinary retention.  5.  She has a stage 1 sacral decubitus ulcer.   PLAN:  1.  Dr. Mechele Collin will investigate the type of PEG tube that is in place and he may need to have old records from [facility] regarding the type of tube that was inserted. The patient may benefit from nutrition consults regarding her low albumin.  2.  For chronically elevated alkaline phosphatase, would recommend hepatitis screening and routine liver labs. She did have a normal alkaline phosphatase in 2013. She presented with her stroke admission August 2014 with alkaline phosphatase 246 and it has been elevated. Alkaline phosphatase may be elevated from nonliver source.   This case was discussed with Dr. Mechele Collin in collaboration of  care. He will evaluate the patient's PEG tube and make decision on removing the tube during this admission.   Thank you for the consultation.   ____________________________ Ranae Plumber Arvilla Market, ANP (Adult Nurse Practitioner) kam:cs D: 01/25/2014 17:10:13 ET T: 01/25/2014 18:37:45 ET JOB#: [id number]  cc: Cala Bradford A. Arvilla Market, ANP (Adult Nurse Practitioner), <Dictator> Ranae Plumber Suzette Battiest, MSN, ANP-BC Adult Nurse Practitioner ELECTRONICALLY SIGNED 01/26/2014 7:41

## 2015-02-17 NOTE — Consult Note (Signed)
PATIENT NAME:  Brenda SarnaFRICK, Despina E MR#:  [id number] DATE OF BIRTH:  [birth year]  DATE OF CONSULTATION:  02/08/2014  CONSULTING PHYSICIAN:  Stann Mainlandavid P. Sampson GoonFitzgerald, MD  REASON FOR CONSULTATION: Recurrent urinary tract infections.   HISTORY OF PRESENT ILLNESS: This is a very pleasant [age] year old female who unfortunately has had multiple strokes and  is  bedbound. She lives at home with her husband and daughter. She also has a history of hypertension, diabetes and recurrent urinary infections. She has had several admissions over the several months for recurrent urinary infections and it appears that she has been developing neurogenic bladder after being able to get by with voiding in [place] prior to that. Her husband states that in February she underwent an outpatient catheterization to obtain a urine sample. She then developed an urinary tract infection and ended up requiring admission. She was found on her most recent admission to have 800 mL of retained urine. This was felt to be neurogenic bladder and Dr. Achilles Dunkope was consulted. She failed an attempt at Foley removal despite treatment with Flomax and baclofen. She was discharged home with a chronic Foley and antibiotics; however, she was readmitted April 13 with symptoms of confusion and decreased urine output, as well as chills.   Currently, she has improved clinically and urine cultures are growing pseudomonas. She also has been seen by neurology and has a history of prior seizures. These are relatively well controlled at this point.   PAST MEDICAL HISTORY: 1. Recurrent CVAs.  2. Hypertension.  3. Diabetes.  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  5. Prior PEG, now removed.  6. Prior tracheostomy, now removed.  7. Depression.   PAST SURGICAL HISTORY:  1. Appendectomy.  2. Cholecystectomy.  3. Tracheostomy and PEG tube.   SOCIAL HISTORY: The patient is cared for at home by her husband and daughter.  She does not smoke or drink.   FAMILY HISTORY: Noncontributory.   ALLERGIES: PENICILLIN.   REVIEW OF SYSTEMS: Unable to be obtained.   MEDICATIONS: Antibiotics since admission: Include ceftriaxone given April 13. Ciprofloxacin begun April 12, fluconazole April 13 and 14. She also received vancomycin April 12. Current antibiotics include ciprofloxacin. Other medications are Keppra, Remeron, fluconazole, Maalox, trazodone, Flomax, Phenergan, phenytoin, pantoprazole, nystatin powder metoprolol, irbesartan, sliding scale insulin, iron, Lovenox, Colace, Benadryl, aspirin, acetaminophen.   PHYSICAL EXAMINATION: VITAL SIGNS: T-max 98.8 she has not had fevers since admission, pulse is 62, blood pressure 111/73. Her respirations 18, sat 100%.  GENERAL: She is lying in bed. She is able to answer some questions and talk. Her pupils are reactive. Extraocular movements are intact.  OROPHARYNX: Clear.  NECK: Somewhat stiff but she has a tracheostomy site that is healed.  HEART: Regular.  LUNGS: Clear.  ABDOMEN: Soft, nontender, obese, nondistended.  EXTREMITIES: No clubbing, cyanosis or edema.  GENITOURINARY: She has a chronic Foley catheter in that has clear urine.  NEUROLOGIC: She has hemiparesis from her prior CVA. She is awake and alert; however.   DATA: White blood count on admission was 7.5, currently 3.9, hemoglobin 6.4, platelets 291. Renal function shows a creatinine of 0.65. Urinalysis on admission showed too numerous to count white cells. Culture of her urine grew Pseudomonas greater than 100,000 sensitive to Cipro, gentamicin and imipenem it was intermediate to ceftazidime and levofloxacin. Blood cultures x 2 from April 13 are negative.   Imaging: CT of the head showed no acute intracranial pathology but chronic right MCA  territory infarct. The patient did have an EEG as well. Prior microbiology data is reviewed from the last year. She has had multiple episodes of positive urinary cultures  including March 31 where she grew Escherichia coli and viridans strep both greater than 100,000. In December she had trach culture growing moderate methicillin sensitive staph as well as Stenotrophomonas maltophilia. In 05/2013 she had urine culture growing Escherichia coli in low numbers as well as viridans strep.   IMPRESSION: A 59 year old unfortunate woman with multiple cerebrovascular accidents now with neurogenic bladder and chronic Foley for the last several weeks. She has been readmitted with pseudomonas urinary tract infection. Her blood cultures are negative. Clinically, she has responded and the organism is sensitive to ciprofloxacin. She does have a seizure disorder and it would be best to avoid quinolones; however, given that this is only the oral agent the pseudomonas is sensitive to, I think it would be best to use it.   RECOMMENDATIONS: 1. Continue Cipro for 21 day course.  2. Change Foley catheter prior to discharge.  3. Follow up with urology. If she has difficulty urinating it would probably be best to switch her to a suprapubic catheter. She will discuss this further with Dr. Achilles Dunk.  4. If I can be of assistance as an outpatient to further guide antibiotic management, please contact my office and I can see her.   Thank you for the consult. I will be glad to follow with you.   Onalee Hua P. Sampson Goon, MD dpf:sg D: 02/08/2014 15:27:15 ET T: 02/08/2014 15:45:32 ET JOB#: [id number]  cc: Stann Mainland. Sampson Goon, MD, <Dictator> Korbyn Chopin Sampson Goon MD ELECTRONICALLY SIGNED 02/12/2014 21:16

## 2015-02-17 NOTE — Consult Note (Signed)
Pt with feeding tube placed several months ago due to CVA, tube has not been used for at least 3 months and patient eating and drinking ok and taking meds.  She and family would like to have tube removed and that is reasonable.  Will do this tomorrow with a dose of iv fentanyl before removal.    Electronic Signatures: Scot JunElliott, Robert T (MD)  (Signed on 01-Apr-15 18:58)  Authored  Last Updated: 01-Apr-15 18:58 by Scot JunElliott, Robert T (MD)

## 2015-02-17 NOTE — Consult Note (Signed)
EGD done with cutting the PEG tube then pulling the button out through the mouth with alligator forceps. I tried to pull it out  without EGD   but it would not yield to reasonable traction.Clear liq and meds ok and start full liquids tomorrrow 5pm. After 24 hours of full liquids can have usual diet.  Electronic Signatures: Scot JunElliott, Rizwan Kuyper T (MD)  (Signed on 02-Apr-15 17:56)  Authored  Last Updated: 02-Apr-15 17:56 by Scot JunElliott, Xyla Leisner T (MD)

## 2015-02-17 NOTE — H&P (Signed)
PATIENT NAME:  Brenda Pope, Brenda Pope MR#:  [id number] DATE OF BIRTH:  [birth year]  DATE OF ADMISSION:  02/06/2014  PRIMARY CARE PHYSICIAN:  [name], FNP  REFERRING PHYSICIAN: Su Leyobert L. Kinner, MD  CHIEF COMPLAINT: Confusion and less urine today.  HISTORY OF PRESENT ILLNESS: A 59 year old Caucasian female with a history of multiple CVAs, bedbound, diabetes, hypertension, depression, recent UTI, was sent from home to ED due to confusion and less urine. The patient is confused, unable to provide any information. According to the patient's daughter, the patient was noticed to be confused today and also the patient has less urine. In addition, the patient has a fever, chills, so the patient was sent to the ED for further evaluation. According to the patient's daughter, the patient has a Foley catheter  placed 3 weeks ago. The patient's urinalysis showed UTI with yeast. The patient was treated with  fluconazole and vancomycin the ED.   PAST MEDICAL HISTORY: Multiple CVA, hypertension, diabetes, depression, status post PEG tube and tracheostomy tube.  PEG tube and tracheostomy tube was removed.    PAST SURGICAL HISTORY: Appendectomy, cholecystectomy, tracheostomy, PEG tube.   SOCIAL HISTORY:  No smoking or drinking or illicit drugs.  Living at home with home health.   FAMILY HISTORY: CAD.  REVIEW OF SYSTEMS:  Unable to obtain due to the patient's mental status.   ALLERGIES: PENICILLIN.   HOME MEDICATIONS:   1.  Acetaminophen 650 mg p.o. every 8 hours p.r.n. 2.  Aspirin 325 mg p.o. daily.  3.  Benadryl 25 mg p.o. at bedtime.  4.  Ferrous sulfate 220 mg p.o. twice a day.  5.  HCTZ 25 mg p.o. daily.  6.  Irbesartan 75 mg p.o. b.i.d.  7.  Keppra 100 mg/mL, 5 mL p.o. twice a day.  8.  Lopressor 50 mg p.o. b.i.d.  9.  Mucinex 1 tablet p.o. b.i.d.  10.  Zofran 4 mg every 8 hours p.r.n.  11.  Prinivil 40 mg p.o. once a day.  12.  Phenytoin 25 mg/mL oral suspension, 5 mL p.o. 3 times a day.  14.   Promethazine 25 mg every 6 hours p.r.n.  15.  Tamsulosin 0.4 mg p.o. b.i.d.  16.  Tramadol 50 mg every 6 hours p.r.n.  17.  Trazodone 50 mg 1/2 tablet p.o. at bedtime.   PHYSICAL EXAMINATION: VITAL SIGNS: Temperature 99.9, blood pressure 143/78, pulse 74, O2 saturation 100% on room air.  GENERAL: The patient awake but confused, does not follow commands, in no acute distress.  HEENT: Pupils round, equal and reactive to light and accommodation. Dry oral mucosa.  NECK: Supple. No JVD or carotid bruit. No lymphadenopathy. No thyromegaly.  CARDIOVASCULAR: S1, S2 regular rate and rhythm. No murmurs or gallops.  PULMONARY: Bilateral air entry. No wheezing or rales. No use of accessory muscle to breathe.  ABDOMEN: Soft. No distention or tenderness. No organomegaly. Bowel sounds present.  EXTREMITIES: No edema, clubbing or cyanosis. No calf tenderness. Bilateral pedal pulses present.  SKIN: No rash or jaundice.  NEUROLOGIC: The patient is confused, unable to examine at this time.   LABORATORY, DIAGNOSTIC AND RADIOLOGICAL DATA:  1.  Urinalysis showed leukocyte esterase 3+, yeast positive, WBC TNTC.  2.  CBC showed WBC 7.5, hemoglobin 8.0, platelets 372.  3.  Glucose 129, BUN 13, creatinine 0.63. Electrolytes are normal.  4.  EKG showed normal sinus rhythm at 72 BPM.   IMPRESSIONS: 1.  Urinary tract infection.  2.  Acute metabolic encephalopathy, possibly due to  urinary tract infection.  3.  Hypertension.  4.  Anemia.  5.  Diabetes.  6.  History of cerebrovascular accident with bedbound status.   PLAN OF TREATMENT: 1.  The patient will be placed for observation. We will give Cipro and Diflucan. Follow up urine culture and CBC.  2.  We will give IV fluid support. Follow up BMP.  3.  For hypertension, continue Lopressor and irbesartan. Hold HCTZ.  4.  For diabetes, we will start sliding scale.  5.  Start aspiration and seizure precautions.  6.  Dietitian consult for hypoalbuminemia.  7.  I  discussed the patient's condition and plan of treatment with the patient's daughter. The patient's daughter wants the patient full code.   TIME SPENT: About 62 minutes.   ____________________________ Shaune Pollack, MD qc:cs D: 02/06/2014 17:55:00 ET T: 02/06/2014 19:13:33 ET JOB#: [id number]  cc: Shaune Pollack, MD, <Dictator> Shaune Pollack MD ELECTRONICALLY SIGNED 02/07/2014 15:49

## 2015-02-17 NOTE — Consult Note (Signed)
Referring Physician:  Shaune Pollack :   Primary Care Physician:  Shaune Pollack : [facility], [address], [place], [place] [address], [place] [phone]  Reason for Consult: Admit Date: 06-Feb-2014  Chief Complaint: confusion  Reason for Consult: seizure   History of Present Illness: History of Present Illness:   59 yo RHD F who is [place] bound presents to [facility] secondary to confusion.  Pt has been back and forth several times due to UTI's.  Upon arrival, it was noted that pt had some twitching of her L arm and face.  Some of these twitches involved the entire body per family.  Pt is aware of these twitches and states that they have slowed down since admission.  ROS:  General weakness    HEENT no complaints    Cardiac no complaints    GI no complaints    GU difficulty urinating  burning with urination    Musculoskeletal no complaints    Extremities no complaints    Skin no complaints    Neuro seizure    Endocrine no complaints    Psych no complaints    Past Medical/Surgical Hx:  pressure ulcers:   UTI:   PEG tube insertion:   tracheostomy:   CVA:   CHF:   Multi-drug Resistant Organism (MDRO): Positive culture for MRSA.  Migraines: per pt/husband  reflux:   htn:   diabetes:   enlarged spleen:   tia:   Appendectomy:   Cholecystectomy:   Past Medical/ Surgical Hx:  Past Medical History as above   Past Surgical History as above   Home Medications: Medication Instructions Last Modified Date/Time  phenytoin 25 mg/mL oral suspension 5 milliliter(s) orally 3 times a day 13-Apr-15 13:02  levETIRAcetam 100 mg/mL oral solution 5 milliliter(s) orally 2 times a day 13-Apr-15 13:02  pantoprazole 40 mg oral delayed release tablet 1 tab(s) orally once a day 13-Apr-15 13:02  ondansetron 4 mg oral tablet 1 tab(s) orally every 8 hours, As Needed - for Nausea, Vomiting 13-Apr-15 13:02  acetaminophen extended release 650 mg oral tablet, extended release 1 tab(s)  orally every 8 hours, As Needed - for Pain 13-Apr-15 13:02  Benadryl 25 mg oral tablet 1 tab(s) orally once a day (at bedtime) 13-Apr-15 13:02  Mucinex 1 tab(s) orally 2 times a day 13-Apr-15 13:02  irbesartan 75 mg oral tablet 1 tab(s) orally 2 times a day 13-Apr-15 13:02  tamsulosin 0.4 mg oral capsule 1 cap(s) orally 2 times a day 13-Apr-15 13:02  ferrous sulfate 220 mg/5 mL oral elixir 5 milliliter(s) orally 2 times a day 13-Apr-15 13:02  aspirin 325 mg oral delayed release tablet 1 tab(s) orally once a day 13-Apr-15 13:02  traMADol 50 mg oral tablet 1 tab(s) orally every 4 hours, As Needed - for Pain 13-Apr-15 13:02  traZODone 50 mg oral tablet 0.5 tab(s) orally once a day (at bedtime) 13-Apr-15 13:02  promethazine 25 mg tablet 1 tab(s) orally every 6 hours, As Needed - for Nausea, Vomiting 13-Apr-15 13:02  metoprolol tartrate 50 mg oral tablet 1 tab(s) orally 2 times a day 13-Apr-15 13:02  hydrochlorothiazide 25 mg oral tablet 1 tab(s) orally once a day 13-Apr-15 13:02   Allergies:  Penicillin: Rash  Allergies:  Allergies PCN    Social/Family History: Employment Status: disabled  Lives With: alone  Living Arrangements: extended care facility  Social History: no tob, no EtOH, no illicits  Family History: no seizures, + CAD   Vital Signs: **Vital Signs.:  14-Apr-15 13:51  Vital Signs Type Routine  Temperature Temperature (F) 98.4  Celsius 36.8  Temperature Source oral  Pulse Pulse 68  Respirations Respirations 18  Diastolic BP (mmHg) Diastolic BP (mmHg) 563  Mean BP 78  Pulse Ox % Pulse Ox % 98  Pulse Ox Activity Level  At rest  Oxygen Delivery Room Air/ 21 %   Physical Exam: General: slightly overweight, somewhat poorly kept, mild distress  HEENT: normocephalic, sclera nonicteric, oropharynx clear  Neck: supple, no JVD, no bruits  Chest: CTA B, no wheezing, good movement  Cardiac: RRR, no murmurs, no edema, 2+ pulses  Extremities: no C/C/E, FROM   Neurologic  Exam: Mental Status: sleepy and oriented x 2 not time, moderate dysarthria, nl naming and repetition  Cranial Nerves: PERRLA, EOMI, R homonymous hemianopsia, L droop,  tongue midline, shoulder shrug equal  Motor Exam: 3/5 L, 5 /5 R, L arm twitching noted  Deep Tendon Reflexes: 1+/4 B, down going plantars B  Sensory Exam: decreased temp on L, nl vibration  Coordination: nl F to N on R, impaired on L due to weakness   Lab Results:  Hepatic:  13-Apr-15 12:41   Bilirubin, Total 0.2  Alkaline Phosphatase  205 (45-117 NOTE: New Reference Range 09/16/13)  SGPT (ALT) 26  SGOT (AST) 27  Total Protein, Serum  4.8  Albumin, Serum  1.6  TDMs:  13-Apr-15 12:41   Dilantin, Serum  9.8 (Result(s) reported on 06 Feb 2014 at 09:26PM.)  Routine Micro:  13-Apr-15 13:36   Organism Name GRAM NEGATIVE ROD  Organism Quantity >100,000 CFU/ML  Specimen Source INDWELLING CATHETER  Organism 1 >100,000 CFU/ML GRAM NEGATIVE ROD    13:50   Micro Text Report BLOOD CULTURE   COMMENT                   NO GROWTH IN 18-24 HOURS   ANTIBIOTIC                       Culture Comment NO GROWTH IN 18-24 HOURS  Result(s) reported on 07 Feb 2014 at 01:00PM.  Routine Chem:  14-Apr-15 05:50   Glucose, Serum 98  BUN 12  Creatinine (comp) 0.71  Sodium, Serum 143  Potassium, Serum 3.6  Chloride, Serum  109  CO2, Serum 29  Calcium (Total), Serum  7.9  Anion Gap  5  Osmolality (calc) 285  eGFR (African American) >60  eGFR (Non-African American) >60 (eGFR values <30mL/min/1.73 m2 may be an indication of chronic kidney disease (CKD). Calculated eGFR is useful in patients with stable renal function. The eGFR calculation will not be reliable in acutely ill patients when serum creatinine is changing rapidly. It is not useful in  patients on dialysis. The eGFR calculation may not be applicable to patients at the low and high extremes of body sizes, pregnant women, and vegetarians.)  Magnesium, Serum 1.8  (1.8-2.4 THERAPEUTIC RANGE: 4-7 mg/dL TOXIC: > 10 mg/dL  -----------------------)  Routine UA:  13-Apr-15 13:36   Color (UA) AMBER  Clarity (UA) CLOUDY  Glucose (UA) NEGATIVE  Bilirubin (UA) NEGATIVE  Ketones (UA) NEGATIVE  Specific Gravity (UA) 1.025  Blood (UA) 1+  pH (UA) 6.0  Protein (UA) 30 mg/dL  Nitrite (UA) NEGATIVE  Leukocyte Esterase (UA) 3+ (Result(s) reported on 06 Feb 2014 at 03:51PM.)  RBC (UA) TNTC  WBC (UA) TNTC  Bacteria (UA) 1+ (TRACE/FEW)  Epithelial Cells (UA) NONE SEEN  Other 1 (UA) 5-15 TRANSITIONAL  Result(s) reported  on 06 Feb 2014 at 03:51PM.  Budding Yeast (UA) PRESENT  Hyphae Yeast PRESENT  Routine Hem:  14-Apr-15 05:50   WBC (CBC) 5.8  RBC (CBC)  2.50  Hemoglobin (CBC)  7.4  Hematocrit (CBC)  22.6  Platelet Count (CBC) 335  MCV 90  MCH 29.6  MCHC 32.9  RDW  15.0  Neutrophil % 78.5  Lymphocyte % 10.4  Monocyte % 7.8  Eosinophil % 2.7  Basophil % 0.6  Neutrophil # 4.6  Lymphocyte #  0.6  Monocyte # 0.5  Eosinophil # 0.2  Basophil # 0.0 (Result(s) reported on 07 Feb 2014 at 06:38AM.)   Radiology Results: CT:    13-Apr-15 20:49, CT Head Without Contrast  CT Head Without Contrast   REASON FOR EXAM:    twitches and facial drooping  COMMENTS:       PROCEDURE: CT  - CT HEAD WITHOUT CONTRAST  - Feb 06 2014  8:49PM     CLINICAL DATA:  Tremors    EXAM:  CT HEAD WITHOUT CONTRAST    TECHNIQUE:  Contiguous axial images were obtained from the base of the skull  through the vertex without intravenous contrast.    COMPARISON:  CT HEAD W/O CM dated 01/24/2014; CT HEAD W/O CM dated  09/24/2013; CT HEAD W/O CM dated 06/20/2013    FINDINGS:  There is no evidence of mass effect, midline shiftor extra-axial  fluid collections. There is no evidence of a space-occupying lesion  or intracranial hemorrhage. There is no evidence of a cortical-based  area of acute infarction. There is an old right parietal infarct  with encephalomalacia. There is  periventricular white matter low  attenuation likely secondary to microangiopathy. There is  generalized cerebral atrophy.    The ventricles and sulci are appropriate for the patient's age. The  basal cisterns are patent.    Visualized portions of the orbits are unremarkable. Small right  maxillary sinus mucous retention cyst. Small air-fluid level in the  sphenoid sinus.    The osseous structures are unremarkable.     IMPRESSION:  No acute intracranial pathology.    Chronic right MCA territoryinfarct.      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 that I witnessed are simple partial seizures arising out of the territory of the old stroke;  these are likely provoked by UTI R MCA infarct-  stable load $RemoveBef'500mg'QKRIOXInot$  fosphenytoin to bring level up, will check free level in am continue dilantin home dose continue Keppra home dose no need for MRI or EEG keep Mg > 2, Ca > 8 and Na > 130 avoid flouroquinolones and imipenums which lower seizure threshold treat UTI will follow and and another agent if necessary  Electronic Signatures: Jamison Neighbor (MD)  (Signed 14-Apr-15 23:59)  Authored: REFERRING PHYSICIAN, Primary Care Physician, Consult, History of Present Illness, Review of Systems, PAST MEDICAL/SURGICAL HISTORY, HOME MEDICATIONS, ALLERGIES, Social/Family History, NURSING VITAL SIGNS, Physical Exam-, LAB RESULTS, RADIOLOGY RESULTS, Recommendations   Last Updated: 14-Apr-15 23:59 by Jamison Neighbor (MD)

## 2015-02-17 NOTE — H&P (Signed)
PATIENT NAME:  Brenda Pope, Brenda Pope MR#:  [id number] DATE OF BIRTH:  Nov 29, 1955  DATE OF ADMISSION:  01/24/2014  PRIMARY CARE PHYSICIAN: [facility].   CHIEF COMPLAINT: Hypotension, lethargy.   HISTORY OF PRESENT ILLNESS: A very pleasant 59 year old female with a history of CVA with left-sided paralysis who presents with  above complaints. The history is taken from husband who is at bedside. Apparently about a week ago patient's blood pressure was low. She was seen in the ER diagnosed with urinary track infection discharged with Bactrim. She also at that time had urinary retention so she was discharged with a Foley. According to the husband, on Friday she was not feeling well. She received some Phenergan and Oxycodone. Saturday her blood pressure was low. The home health nurse felt that her blood pressure was low, probably from the Phenergan and Oxycodone, and she was lethargic. On Sunday and Monday she actually was better and back to her normal self. This morning her blood pressure was 90/68. Two hours later when the home health nurse came it was 50/40. She was lethargic, so she was sent here for further evaluation. In the ER, her blood pressure was reported as 89/55. She seems to be back to her baseline. She was started on broad-spectrum antibiotics including cefepime and vancomycin. She had a UA, which is positive for urinary tract infection.   REVIEW OF SYSTEMS:  CONSTITUTIONAL: No fevers. She does have lethargy.  EYES:  No glaucoma or cataracts.  EARS, NOSE AND THROAT:  No hearing loss, discharge, epistaxis.  RESPIRATORY:  The patient's husband says that she has congestion. No dyspnea, but she is bedbound.  CARDIOVASCULAR: No chest pain, orthopnea, edema, dyspnea on exertion. She does not ambulate. No palpitations, syncope.  GASTROINTESTINAL:  She had some nausea. No vomiting, diarrhea. No abdominal pain, melena GENITOURINARY:  No dysuria or hematuria, but she does have urinary retention.  ENDOCRINE:   No increased sweating.  HEMATOLOGIC AND LYMPHATIC:  Positive anemia. SKIN:  No rashes or lesions.  MUSCULOSKELETAL:  She is bedbound.   NEUROLOGIC:  Positive history of CVA.  PSYCHIATRIC: No history of anxiety. Positive for depression.   PAST MEDICAL HISTORY:  1.  Multiple CVAs.  2.  She still has a PEG tube. She has a history of tracheostomy.  3.  Diabetes.  4.  Labile hypertension.  5.  Depression.   PAST SURGICAL HISTORY:   1.  Appendectomy.  2.  Cholecystectomy. 3.  Tracheostomy.  4.  PEG tube. She does not have a trach anymore, but she still does have a PEG tube.    ALLERGIES:  PENICILLIN, UNKNOWN REACTION.   MEDICATIONS:  1.  Trazodone 50 mg 1/2 tablet daily.  2.  Tramadol 50 mg 1 tablet q. 4 hours p.r.n.  3.  Promethazine 25 mg q. 6 hours p.r.n.  4.  Phenytoin 5 mL t.i.d.  5.  Pantoprazole 40 mg daily.  6.  Zofran 4 mg q. 8 hours p.r.n.  7.  Mucinex 1 tablet b.i.d.  8.  Metoprolol 50 mg t.i.d.  9.  Keppra 100 mg 5 mL b.i.d.  10. Irbesartan 75 mg b.i.d.  11. Hydrochlorothiazide 25 mg daily.  12. Hydralazine 30 mg t.i.d.  13. Hydralazine 10 mg t.i.d.  14. Ferrous sulfate 5 mL b.i.d.  15. Benadryl 25 mg at bedtime.  16. Aspirin 325 mg daily.  17. Norvasc 10 mg daily.  18. Acetaminophen 1 tablet q. 8 hours p.r.n.  19. Bactrim 1 tablet p.o. b.i.d., which she finished.  SOCIAL HISTORY:  The patient lives with her husband. She is bedbound. She has no alcohol or IV drug use.   FAMILY HISTORY: Positive for CAD.   PHYSICAL EXAMINATION:  VITAL SIGNS:  The patient is afebrile. Temperature 98.2, pulse 62,  respirations 18, blood pressure 89/55, 98% on room air.  GENERAL: The patient is lethargic, but at times, she is responsive.  HEENT: Head is atraumatic. Pupils are round and reactive. Sclerae anicteric. Mucous membranes are moist. Oropharynx is dry.   NECK:  Supple. Hard to appreciate if there is any kind of enlarged thyroid. She does have well-healed scar from her  tracheostomy. CARDIOVASCULAR: Regular rate and rhythm. No murmurs, gallops, or rubs. PMI is not displaced.  LUNGS: Clear to auscultation without crackles, rales, rhonchi, or wheezing. This was heard anteriorly.  ABDOMEN: Bowel sounds positive, nontender and nondistended. No hepatosplenomegaly. She has a PEG tube, without any obvious infection.  EXTREMITIES:  No clubbing, cyanosis or edema.  NEUROLOGIC: She has left-sided paralysis, unable to move entire left side.  SKIN: Without rash or lesions.   LABORATORY DATA: PH 7.43,  pCO2 of 42, pO2 of 69, FiO2 of 21, lactic acid 2.10. White blood cells 14.3, hemoglobin 7.8, hematocrit 24, platelets 371,000. Sodium 134, potassium 3.6, chloride 99, bicarbonate 26, BUN 34, creatinine 1.60, glucose 201. Alkaline phosphatase 225, ALT 14, AST 30, total protein 5.1, albumin is 1.7, magnesium 1.6. INR 1.0. Urinalysis 3+ LCE  with 77 white blood cells, 24 red blood cells. Troponin is less than 0.02. Dilantin level is 23.   Chest x-ray shows no acute cardiopulmonary disease. CT of the head shows no acute intracranial hemorrhage or CVA. There is an old right MCA territory infarct with encephalomalacia.   I do not see an EKG.   ASSESSMENT AND PLAN: This is a 59 year old female with a history of CVA, diet controlled diabetes, who presents with sepsis likely secondary to urinary tract infection.   1.  Encephalopathy. The patient presents initially with encephalopathy and sepsis. She seems to  have improved. I suspect her encephalopathy is secondary to urinary tract infection as well as dehydration. She is improved with antibiotics and IV fluids, which will continue.  2.  Sepsis. The patient presented with leukocytosis and hypotension. I suspect her sepsis is secondary to urinary tract infection. She was treated with Bactrim, which probably was not effective. I will treat with Rocephin. Urine culture and  blood cultures have been ordered in the ED. We will continue to  monitor and provide some IV fluids. Her lactic acid is elevated, which can be an indication for sepsis.  3.  Acute renal failure likely secondary to dehydration and/or Bactrim use. We will hold Bactrim obviously. We will provide IV fluids. Repeat a BMP in the a.m.  4.  Urinary retention. The patient was diagnosed with this about a week ago. She has a sacral decubitus ulcer, so she should be discharged probably with a Foley.  I do not think that her urinary tract infection is from the Foley catheter. I think that it was just untreated and the Bactrim was not effective. We will also start Flomax for her urinary retention.  5.  History of CVA. We will continue her outpatient medications.  6.  Diabetes. Patient is on sliding-scale insulin, which we will continue.  7.  Stage I sacral decubitus ulcer. The patient will need to be turned every 2 hours. Wound culture has been ordered.  8.  History of percutaneous endoscopic gastrostomy tube. We  will consult GI to see if they can take this PEG tube out as per the request of the family. The patient is not using it.   CODE STATUS:  Full.   TIME SPENT: Approximately 50 minutes.     ____________________________ Janyth Contes. Juliene Pina, MD spm:tc D: 01/24/2014 16:35:29 ET T: 01/24/2014 21:59:20 ET JOB#: [id number]  cc: Tonny Isensee P. Juliene Pina, MD, <Dictator> [facility] P Maurya Nethery MD ELECTRONICALLY SIGNED 01/25/2014 13:11

## 2015-02-17 NOTE — Consult Note (Signed)
Date of consultation: 01/26/2014 name: Brenda GammonBetsy Evelyn Pope of birth: 12/24/1955  History: Brenda Pope is a 59 year old white female with a history of multiple prior CVAs.  She has significant deficits related to the previous strokes.  She has been managing with diapers for her voiding for a number of years.  Approximately one month ago, her husband noted a change in her urination.  He noted a bulging in the suprapubic region.  Any degree of manipulation would result in urination.  She developed a urinary tract infection shortly thereafter.  She had been catheterized for a urine specimen.  He was under the assumption that the catheterization was the source of the infection.  He states she has a long history of infections with any degree of manipulation with the catheter.  She was treated with a course of Bactrim.  She had progression of her symptoms with altered mental status.  She presented to the emergency room for further evaluation.  A Foley catheter was placed with approximately 800 mL of urine drained.  This is consistent with urinary retention.  It was likely present for a one-month duration.  She was started on baclofen and Flomax.  The Foley catheter was removed approximately 8 days after placement.  She was unable to urinate.  Urological consultation was requested for further treatments.  Past medical history:    Multiple CVAs, diabetes mellitus, hypertension, depression  Past surgical history:      Appendectomy, cholecystectomy, tracheostomy, PEG tube placement  Allergies: Penicillin  Family history: Coronary artery disease  Medications: Trazodone 25 mg at bedtime, tramadol 50 mg every 4 hours as needed, promethazine 25 mg every 6 hours as needed, Dilantin 5 mL 3 times daily, Protonix 40 mg daily, Zofran 4 mg every 8 hours as needed, Mucinex 1 tablet twice daily, metoprolol 50 mg 3 times daily, Keppra 100 mg twice a day, Irbesastan 75 mg twice daily, TriCor thiazide 25 mg daily, hydralazine 30 mg 3  times daily, ferrous sulfate twice daily, Benadryl 25 mg at bedtime, ASA 325 mg daily, Norvasc 10 mg daily, acetaminophen every 8 hours as needed  Physical examination: Afebrile, vital signs stable Gen.: awake, alert, responds appropriately to questions, HEENT: Within normal limits Chest: Clear to auscultation bilaterally Cardiovascular: Regular rate and rhythm Abdomen: Soft and nontender nondistended, no palpable masses, PEG tube in place Extremities: Free range of motion 2 right-sided, no significant edema Neurological: left-sided paralysis  Assessment: Urinary retention, probable neurogenic bladder, acute cystitis  Recommendation: The urinary retention was likely long-standing in nature.  With the failure of the voiding trial with Flomax, Foley catheter continuation is recommended at present.  She would likely benefit from urodynamic studies to evaluate her overall bladder function.  This will determine if there are any other options to try and remove the Foley catheter.  If there is evidence of neurogenic bladder, Foley catheter through the urethra or suprapubic catheter within be recommended.  We will arrange [facility]rodynamic [facility] in the near future.  We will schedule a follow-up after the urodynamic studies for discussion of the options.  If there are any further questions during the hospitalization, please feel free to contact us.  Electronic Signatures: Brenda Pope, Brenda Pope (MD)  (Signed on 03-Apr-15 08:03)  Authored  Last Updated: 03-Apr-15 08:03 by Brenda Pope, Brenda Pope (MD)

## 2015-02-18 NOTE — Discharge Summary (Signed)
PATIENT NAME:  Brenda Pope, Brenda Pope MR#:  [id number] DATE OF BIRTH:  1956-02-03  DATE OF ADMISSION:  10/16/2011 DATE OF DISCHARGE:  10/22/2011  ADDENDUM   I have advised her to stop aspirin as she was started on Plavix.    ____________________________ Fredia SorrowAbhinav Trinity Haun, MD ag:ap D: 10/22/2011 11:38:40 ET T: 10/23/2011 11:04:08 ET JOB#: [id number]  cc: Fredia SorrowAbhinav Blia Totman, MD, <Dictator> [facility] Fredia SorrowABHINAV Darris Staiger MD ELECTRONICALLY SIGNED 11/14/2011 12:03

## 2015-02-18 NOTE — Discharge Summary (Signed)
PATIENT NAME:  Brenda Pope, Brenda Pope MR#:  [id number] DATE OF BIRTH:  09-08-1956  DATE OF ADMISSION:  10/16/2011 DATE OF DISCHARGE:  10/22/2011  DISCHARGE DIAGNOSES:  1. Acute right parietal cerebrovascular accident. 2. Malignant hypertension. 3. Diabetes. 4. Urinary tract infection secondary to staphylococcus aureus. 5. Acute bronchitis. 6. Acute renal failure, resolved.  7. 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   HOSPITAL COURSE: This is a 59 year old female who has history of hypertension, diabetes, and history of multiple transient ischemic attacks in the past. She presented with altered mental status. She has not been taking any blood pressure medications for the last few months. Her blood pressure was 243/112 on arrival. She was admitted for hypertensive encephalopathy. She was admitted to the Intensive Care Unit. She was started on nitro drip. She was restarted on p.o. blood pressure medications, amlodipine and beta blocker. She had a CT of the head done at admission which only showed negative chronic small vessel ischemic disease. An MRI was done on the patient the next day on 10/17/2011 because the patient had some slurred speech and numbness on the left side. It shows punctate areas of restricted diffusion in the right parietal lobe suggestive of acute, subacute punctate nonhemorrhagic infarct. The patient had a punctate acute cerebrovascular accident. She had an ultrasound carotid done and ultrasound of the carotids did not show any significant stenosis, antegrade flow in both vertebrals. An echocardiogram was done which shows ejection fraction of greater than 55%, borderline right ventricular enlargement, mild MR, mild TR, impaired LV relaxation. Now her blood pressure is better controlled. She is started on metoprolol, Norvasc. I will also add Cozaar to her regimen. When she came in, she had some renal failure. Her creatinine went up. When she came in, her creatinine was 0.89.  Her LFTs were normal. Her Troponins were negative. Her creatinine went up to 1.5, but then it improved. She was given some IV hydration also. Her lipid profile showed that she had triglycerides of 329 with a LDL of 67. She has been started on statin because she has an acute cerebrovascular accident and this needs to be followed up as outpatient. Her hemoglobin A1c is 10 and she has a good regimen of Lantus and NovoLog as an outpatient. She is on 52 units of Lantus at bedtime and 32 units of NovoLog three times a day. Her urinalysis when she came in was negative, but she developed  some dysuria and urinary tract infection during the hospital course. She had a Foley catheter placed during the hospital stay. Repeat urinalysis on 12/23 shows positive nitrites, 2+ leukocyte esterase and 277 white blood cells per high-powered field. She was started on IV Cipro. Her white count remained normal at 10.9. Her creatinine remained stable at 0.99. Her dysuria has improved. Her urine culture today showed that she had greater than 100,000 colonies of staph aureus, sensitivities pending, but I will discharge her on Bactrim. That should cover Methicillin Resistant Staphylococcus Aureus if that is there. Her creatinine at discharge is 0.8, normal potassium of 4.2. She also developed some acute bronchitis. She is getting Zithromax, but Bactrim should cover her respiratory symptoms also. She got four doses of Zithromax here. I am going to give her some Mucinex and albuterol as needed. Her chest x-ray was negative. Physical therapy saw her and she did well with physical therapy. She does not have any PT needs an outpatient. They want to be set up with [facility] or [place]  [facility]. We will try to arrange that. Her BMP should be monitored as an outpatient because the patient will be on Cozaar.   DISCHARGE MEDICATIONS: The patient was taking aspirin and she had multiple transient ischemic attacks and now new stroke on aspirin, so it  has been changed to Plavix.  New medications:  1. Plavix 75 mg p.o. once daily. 2. Metoprolol 50 mg p.o. b.i.d.  3. Amlodipine 10 mg p.o. once daily. 4. Pravastatin 40 mg at bedtime. 5. Cozaar 50 mg p.o. once daily. 6. Bactrim DS, one tablet p.o. b.i.d. for seven days. 7. Mucinex LA 600 mg p.o. b.i.d. for five days.  8. Albuterol metered dose inhaler 2 puffs every 4 to 6 hours as needed for wheezing or shortness of breath.   Home medications:  1. Lantus 52 units subcutaneous at bedtime. 2. NovoLog 32 units subcutaneous t.i.d. before meals which is home dose.   DIET: Advised a low-sodium, ADA low-cholesterol diet.   CONDITION AT DISCHARGE: Comfortable. T-max 97.5, heart rate 68, blood pressure ranging from 169/79 to 178/81. We are going to recheck her blood pressure before discharge. She is saturating 96% on room air. Chest: Mild coarse breath sounds, but otherwise clear. Heart sounds are regular. Abdomen is soft, nontender.   FOLLOWUP: The patient should follow up with [facility] in 1 to 2 weeks. [facility].   TIME SPENT ON DISCHARGE: 55 minutes.   ____________________________ Fredia SorrowAbhinav Asmi Fugere, MD ag:ap D: 10/22/2011 11:36:06 ET T: 10/23/2011 10:50:16 ET JOB#: [id number]  cc: Fredia SorrowAbhinav Eulises Kijowski, MD, <Dictator> [facility] Fredia SorrowABHINAV Malijah Lietz MD ELECTRONICALLY SIGNED 11/14/2011 12:03

## 2015-02-18 NOTE — Discharge Summary (Signed)
PATIENT NAME:  Brenda Pope, Brenda Pope MR#:  [id number] DATE OF BIRTH:  1956/10/02  DATE OF ADMISSION:  10/16/2011 DATE OF DISCHARGE:  10/22/2011  ADDENDUM:  Her urine cultures came back as MRSA greater than 100,000 colonies. The patient is already discharged on Bactrim and MRSA is sensitive to Bactrim. She will take seven days of Bactrim as an outpatient. I called her and she is feeling better. No nausea. I told about the MRSA also. She has previous history of MRSA infection of her foot.  The patient can have a repeat urinalysis done after completion of antibiotics at the [facility]. I told her that if she has any worsening of nausea, vomiting, dysuria, or fever she can call her doctor or come to the emergency room.   ____________________________ Fredia SorrowAbhinav Tery Hoeger, MD ag:bjt D: 10/23/2011 16:19:57 ET T: 10/24/2011 12:07:28 ET JOB#: [id number]  cc: Fredia SorrowAbhinav Evalise Abruzzese, MD, <Dictator> [facility] Fredia SorrowABHINAV Afrika Brick MD ELECTRONICALLY SIGNED 11/14/2011 12:06

## 2015-02-18 NOTE — H&P (Signed)
PATIENT NAME:  Brenda SarnaFRICK, Maybel E MR#:  [id number] DATE OF BIRTH:  Jun 25, 1956  DATE OF ADMISSION:  10/16/2011  [facility]IC[facility]    CHIEF COMPLAINT: Altered mental status.   HISTORY OF PRESENT ILLNESS: History is obtained from the patient's husband and daughter. The patient is confused, unable to give any history and review of systems. Ms. Brenda Pope is a pleasant 59 year old Caucasian female with past medical history of hypertension and diabetes, comes into the Emergency Room with confusion, nausea, vomiting, and severe headache. Per husband, the patient has been dealing with her elevated blood pressure for the past several months; and her [facility], and she does not tolerate the medications [facility] and has not taken her pills for the last few weeks. She was found to have blood pressure of 243/112 on arrival. The patient is confused, had nausea, vomiting, and severe headache. She also received some Dilaudid for her headache. CT of the head is negative for cerebrovascular accident. She is being admitted for hypertensive encephalopathy.   PAST MEDICAL HISTORY:  1. Hypertension. 2. Type 2 diabetes.  3. Enlarged spleen.  4. Gastroesophageal reflux disease.  5. Migraine headaches.  6. History of transient ischemic attack.   CURRENT MEDICATIONS: Her husband does not remember the names.   ALLERGIES: PCN  FAMILY HISTORY: Positive for hypertension.   SOCIAL HISTORY: The patient works as an Advertising account plannerinsurance agent. She is a nonsmoker, nonalcoholic.   REVIEW OF SYSTEMS: Review of systems is very limited. The patient tells me she is feeling sick to her stomach and has headache.   PHYSICAL EXAMINATION:  GENERAL: The patient appears to be very restless due to her headache and nausea.   VITAL SIGNS: She is afebrile. Her most recent blood pressure is 222/99, pulse is 85, respirations 24, saturations are 99% on room air.   HEENT:  Atraumatic, normocephalic. Pupils are equal, round, and reactive to light and accommodation. Extraocular movements are intact. Oral mucosa is dry.   NECK: Supple. No JVD. No carotid bruit.   LUNGS: Clear to auscultation bilaterally. No rales, rhonchi, respiratory distress, or labored breathing.   CARDIOVASCULAR: Both the heart sounds are normal. Rate, rhythm is regular. PMI is not lateralized. Chest is nontender.   EXTREMITIES: Good pedal pulses, good femoral pulses. No lower extremity edema.   ABDOMEN: Soft, benign, and nontender. No organomegaly. Positive bowel sounds.   NEUROLOGICAL: Grossly intact cranial nerves II through XII. The patient is moving all her extremities well. Again, the exam is limited since the patient has severe headache and has altered mental status.   LABORATORY, DIAGNOSTIC AND RADIOLOGICAL DATA:  EKG shows normal sinus rhythm with left ventricular hypertrophy. Urinalysis is positive for glucosuria, 1+ blood and 30 mg/dL of proteinuria. CT of the head without contrast: No acute intracranial process.  CBC within normal limits.  First set of cardiac enzymes negative. Glucose is 285, BUN is 13, creatinine 0.8, sodium is 138, potassium 3.4.  LFTs are within normal limits.     ASSESSMENT: Ms. Brenda Pope is a 59 year old with:  1. Altered mental status, appears to be due to hypertensive encephalopathy. Blood pressure on arrival was243/112.  2. Proteinuria/hematuria, appears to be due to hypertensive nephritis.   PLAN:  1. Admit the patient to the Critical Care Unit.  2. We will keep the patient n.p.o. until her mental status improves.  3. Start on normal saline 60/hour.   4. Nitroglycerin drip for hypertensive  urgency.  5. We will start p.o. amlodipine and beta blockers once the patient is able to take p.o.  6. Phenergan and Zofran p.r.n.  for nausea and vomiting.  7. Heparin for deep vein thrombosis prophylaxis.  8. Sliding scale insulin.  9. Care Management to help  to set up appointment at [facility].  10. The patient had an echocardiogram done in March of 2012 which revealed ejection fraction of 55%. Left systolic function was normal. Left atrium is mildly dilated. Right atrium is mildly dilated. Mild-to-moderate MR, mild-to-moderate TR.  Hence, I will not repeat the echo at this time.  11. Consider MRI  if the patient's mentation does not improve.  12. Further work-up according to the patient's clinical course.   The hospital admission plan was discussed with the patient's family members, who were her husband and daughter.   CRITICAL TIME SPENT: 50 minutes   ____________________________ Jearl Klinefelter A. Allena Katz, MD sap:cbb D: 10/16/2011 20:27:07 ET T: 10/17/2011 06:41:07 ET JOB#: [id number]  cc: Jami Bogdanski A. Allena Katz, MD, <Dictator> [facility] Willow Ora MD ELECTRONICALLY SIGNED 10/30/2011 14:42

## 2015-02-18 NOTE — Discharge Summary (Signed)
PATIENT NAME:  Brenda Pope, Brenda E MR#:  [id number] DATE OF BIRTH:  1956-07-27  DATE OF ADMISSION:  10/16/2011 DATE OF DISCHARGE:  10/22/2011  ADDENDUM: The patient is requesting some Phenergan for nausea as needed at home. I will give her 25 mg of Phenergan to take every 8 hours as needed for home. This has been added to her discharge instructions.  ____________________________ Fredia SorrowAbhinav Dreanna Kyllo, MD ag:slb D: 10/22/2011 15:30:08 ET T: 10/23/2011 12:50:52 ET JOB#: [id number]  cc: Fredia SorrowAbhinav Clydene Burack, MD, <Dictator> [facility] Fredia SorrowABHINAV Nashly Olsson MD ELECTRONICALLY SIGNED 11/14/2011 12:05
